# Patient Record
Sex: Female | Born: 1950
Health system: Southern US, Community
[De-identification: ages and names within clinical notes are randomized; demographics above are authoritative.]

## PROBLEM LIST (undated history)

## (undated) DIAGNOSIS — I10 Essential (primary) hypertension: Secondary | ICD-10-CM

## (undated) DIAGNOSIS — M8580 Other specified disorders of bone density and structure, unspecified site: Secondary | ICD-10-CM

## (undated) DIAGNOSIS — M1990 Unspecified osteoarthritis, unspecified site: Secondary | ICD-10-CM

## (undated) DIAGNOSIS — B019 Varicella without complication: Secondary | ICD-10-CM

## (undated) DIAGNOSIS — E785 Hyperlipidemia, unspecified: Secondary | ICD-10-CM

## (undated) DIAGNOSIS — E119 Type 2 diabetes mellitus without complications: Secondary | ICD-10-CM

## (undated) HISTORY — DX: Other specified disorders of bone density and structure, unspecified site: M85.80

## (undated) HISTORY — DX: Unspecified osteoarthritis, unspecified site: M19.90

## (undated) HISTORY — DX: Essential (primary) hypertension: I10

## (undated) HISTORY — DX: Hyperlipidemia, unspecified: E78.5

## (undated) HISTORY — DX: Type 2 diabetes mellitus without complications: E11.9

## (undated) HISTORY — DX: Varicella without complication: B01.9

## (undated) HISTORY — PX: TUBAL LIGATION: SHX77

## (undated) HISTORY — PX: CARDIAC PACEMAKER PLACEMENT: SHX583

---

## 2003-08-26 ENCOUNTER — Encounter: Admission: RE | Admit: 2003-08-26 | Discharge: 2003-08-26 | Payer: Self-pay | Admitting: Family Medicine

## 2003-09-17 ENCOUNTER — Other Ambulatory Visit: Admission: RE | Admit: 2003-09-17 | Discharge: 2003-09-17 | Payer: Self-pay | Admitting: Internal Medicine

## 2005-04-13 ENCOUNTER — Ambulatory Visit: Payer: Self-pay | Admitting: Family Medicine

## 2005-04-13 ENCOUNTER — Other Ambulatory Visit: Admission: RE | Admit: 2005-04-13 | Discharge: 2005-04-13 | Payer: Self-pay | Admitting: Internal Medicine

## 2005-04-23 ENCOUNTER — Ambulatory Visit: Payer: Self-pay | Admitting: Family Medicine

## 2005-04-28 ENCOUNTER — Encounter: Admission: RE | Admit: 2005-04-28 | Discharge: 2005-04-28 | Payer: Self-pay | Admitting: Family Medicine

## 2005-06-07 ENCOUNTER — Ambulatory Visit: Payer: Self-pay | Admitting: Family Medicine

## 2005-06-07 ENCOUNTER — Ambulatory Visit: Payer: Self-pay | Admitting: Gastroenterology

## 2005-06-21 ENCOUNTER — Ambulatory Visit: Payer: Self-pay | Admitting: Gastroenterology

## 2005-08-04 ENCOUNTER — Ambulatory Visit: Payer: Self-pay | Admitting: Family Medicine

## 2005-10-25 ENCOUNTER — Ambulatory Visit (HOSPITAL_BASED_OUTPATIENT_CLINIC_OR_DEPARTMENT_OTHER): Admission: RE | Admit: 2005-10-25 | Discharge: 2005-10-26 | Payer: Self-pay | Admitting: Specialist

## 2007-01-05 HISTORY — PX: REDUCTION MAMMAPLASTY: SUR839

## 2011-01-11 DIAGNOSIS — I10 Essential (primary) hypertension: Secondary | ICD-10-CM | POA: Insufficient documentation

## 2011-04-14 ENCOUNTER — Other Ambulatory Visit: Payer: Self-pay | Admitting: Family Medicine

## 2011-04-14 DIAGNOSIS — Z780 Asymptomatic menopausal state: Secondary | ICD-10-CM

## 2011-04-14 DIAGNOSIS — Z1231 Encounter for screening mammogram for malignant neoplasm of breast: Secondary | ICD-10-CM

## 2011-04-16 DIAGNOSIS — E559 Vitamin D deficiency, unspecified: Secondary | ICD-10-CM | POA: Insufficient documentation

## 2011-05-06 ENCOUNTER — Ambulatory Visit
Admission: RE | Admit: 2011-05-06 | Discharge: 2011-05-06 | Disposition: A | Discharge status: Home / Self Care | Payer: BC Managed Care – PPO | Source: Ambulatory Visit | Attending: Family Medicine | Admitting: Family Medicine

## 2011-05-06 DIAGNOSIS — Z1231 Encounter for screening mammogram for malignant neoplasm of breast: Secondary | ICD-10-CM

## 2011-05-06 DIAGNOSIS — Z780 Asymptomatic menopausal state: Secondary | ICD-10-CM

## 2011-06-01 DIAGNOSIS — M818 Other osteoporosis without current pathological fracture: Secondary | ICD-10-CM | POA: Insufficient documentation

## 2011-11-04 DIAGNOSIS — E1122 Type 2 diabetes mellitus with diabetic chronic kidney disease: Secondary | ICD-10-CM | POA: Insufficient documentation

## 2011-11-04 DIAGNOSIS — E1169 Type 2 diabetes mellitus with other specified complication: Secondary | ICD-10-CM | POA: Insufficient documentation

## 2011-11-04 DIAGNOSIS — N1832 Chronic kidney disease, stage 3b: Secondary | ICD-10-CM | POA: Insufficient documentation

## 2012-10-04 DIAGNOSIS — Z8679 Personal history of other diseases of the circulatory system: Secondary | ICD-10-CM | POA: Insufficient documentation

## 2013-01-30 ENCOUNTER — Other Ambulatory Visit: Payer: Self-pay | Admitting: Family Medicine

## 2013-01-30 DIAGNOSIS — Z1231 Encounter for screening mammogram for malignant neoplasm of breast: Secondary | ICD-10-CM

## 2013-02-12 ENCOUNTER — Ambulatory Visit: Payer: BC Managed Care – PPO

## 2013-02-12 ENCOUNTER — Ambulatory Visit
Admission: RE | Admit: 2013-02-12 | Discharge: 2013-02-12 | Disposition: A | Discharge status: Home / Self Care | Payer: BC Managed Care – PPO | Source: Ambulatory Visit | Attending: Family Medicine | Admitting: Family Medicine

## 2013-02-12 DIAGNOSIS — Z1231 Encounter for screening mammogram for malignant neoplasm of breast: Secondary | ICD-10-CM

## 2014-04-16 ENCOUNTER — Other Ambulatory Visit: Payer: Self-pay | Admitting: Family Medicine

## 2014-04-16 DIAGNOSIS — Z1231 Encounter for screening mammogram for malignant neoplasm of breast: Secondary | ICD-10-CM

## 2014-04-19 ENCOUNTER — Ambulatory Visit
Admission: RE | Admit: 2014-04-19 | Discharge: 2014-04-19 | Disposition: A | Discharge status: Home / Self Care | Payer: BC Managed Care – PPO | Source: Ambulatory Visit | Attending: Family Medicine | Admitting: Family Medicine

## 2014-04-19 DIAGNOSIS — Z1231 Encounter for screening mammogram for malignant neoplasm of breast: Secondary | ICD-10-CM

## 2014-07-16 DIAGNOSIS — M1712 Unilateral primary osteoarthritis, left knee: Secondary | ICD-10-CM | POA: Insufficient documentation

## 2015-07-04 ENCOUNTER — Other Ambulatory Visit: Payer: Self-pay | Admitting: Family Medicine

## 2015-07-04 DIAGNOSIS — Z1231 Encounter for screening mammogram for malignant neoplasm of breast: Secondary | ICD-10-CM

## 2015-07-15 ENCOUNTER — Ambulatory Visit
Admission: RE | Admit: 2015-07-15 | Discharge: 2015-07-15 | Disposition: A | Discharge status: Home / Self Care | Payer: BC Managed Care – PPO | Source: Ambulatory Visit | Attending: Family Medicine | Admitting: Family Medicine

## 2015-07-15 DIAGNOSIS — Z1231 Encounter for screening mammogram for malignant neoplasm of breast: Secondary | ICD-10-CM

## 2015-11-12 DIAGNOSIS — I442 Atrioventricular block, complete: Secondary | ICD-10-CM | POA: Insufficient documentation

## 2015-12-05 DIAGNOSIS — I472 Ventricular tachycardia: Secondary | ICD-10-CM | POA: Insufficient documentation

## 2015-12-05 DIAGNOSIS — I4729 Other ventricular tachycardia: Secondary | ICD-10-CM | POA: Insufficient documentation

## 2016-01-12 DIAGNOSIS — E669 Obesity, unspecified: Secondary | ICD-10-CM | POA: Insufficient documentation

## 2016-06-15 ENCOUNTER — Other Ambulatory Visit: Payer: Self-pay | Admitting: Family Medicine

## 2016-06-15 DIAGNOSIS — Z1231 Encounter for screening mammogram for malignant neoplasm of breast: Secondary | ICD-10-CM

## 2016-07-21 ENCOUNTER — Ambulatory Visit: Payer: BC Managed Care – PPO

## 2016-07-22 ENCOUNTER — Ambulatory Visit
Admission: RE | Admit: 2016-07-22 | Discharge: 2016-07-22 | Disposition: A | Discharge status: Home / Self Care | Payer: Medicare Other | Source: Ambulatory Visit | Attending: Family Medicine | Admitting: Family Medicine

## 2016-07-22 DIAGNOSIS — Z1231 Encounter for screening mammogram for malignant neoplasm of breast: Secondary | ICD-10-CM

## 2016-10-04 ENCOUNTER — Other Ambulatory Visit: Payer: Self-pay | Admitting: Nephrology

## 2016-10-04 DIAGNOSIS — N183 Chronic kidney disease, stage 3 unspecified: Secondary | ICD-10-CM

## 2016-10-05 ENCOUNTER — Ambulatory Visit
Admission: RE | Admit: 2016-10-05 | Discharge: 2016-10-05 | Disposition: A | Discharge status: Home / Self Care | Payer: Medicare Other | Source: Ambulatory Visit | Attending: Nephrology | Admitting: Nephrology

## 2016-10-05 DIAGNOSIS — N183 Chronic kidney disease, stage 3 unspecified: Secondary | ICD-10-CM

## 2017-02-21 DIAGNOSIS — Z1211 Encounter for screening for malignant neoplasm of colon: Secondary | ICD-10-CM | POA: Insufficient documentation

## 2017-02-21 LAB — HM COLONOSCOPY

## 2017-03-10 ENCOUNTER — Other Ambulatory Visit: Payer: Self-pay | Admitting: Nurse Practitioner

## 2017-03-10 DIAGNOSIS — R5381 Other malaise: Secondary | ICD-10-CM

## 2017-03-18 ENCOUNTER — Other Ambulatory Visit: Payer: Self-pay | Admitting: Nurse Practitioner

## 2017-03-18 DIAGNOSIS — E2839 Other primary ovarian failure: Secondary | ICD-10-CM

## 2017-03-24 LAB — HM DIABETES EYE EXAM

## 2017-03-31 ENCOUNTER — Encounter: Payer: Self-pay | Admitting: Emergency Medicine

## 2017-04-21 ENCOUNTER — Ambulatory Visit
Admission: RE | Admit: 2017-04-21 | Discharge: 2017-04-21 | Disposition: A | Discharge status: Home / Self Care | Payer: Medicare Other | Source: Ambulatory Visit | Attending: Nurse Practitioner | Admitting: Nurse Practitioner

## 2017-04-21 DIAGNOSIS — E2839 Other primary ovarian failure: Secondary | ICD-10-CM

## 2017-06-23 ENCOUNTER — Telehealth: Payer: Self-pay | Admitting: Emergency Medicine

## 2017-06-23 NOTE — Telephone Encounter (Signed)
Copied from CRM [id number]. Topic: Inquiry >> Jun 23, 2017  8:42 AM Cipriano BunkerLambe, Annette S wrote: Reason for CRM:    Pt. Is inquiring if office has received records [facility]  Please let pt. Know,  ok to leave message  >> Jun 23, 2017  8:49 AM Lenis Dickinsonillard, Bethany M, CMA wrote: Have you received records?   Left Detailed message informing patient that we have no received her records.   Kathi SimpersAmy Peterman,  LPN

## 2017-06-24 ENCOUNTER — Encounter: Payer: Self-pay | Admitting: Family Medicine

## 2017-06-28 ENCOUNTER — Ambulatory Visit (INDEPENDENT_AMBULATORY_CARE_PROVIDER_SITE_OTHER): Payer: Medicare Other | Admitting: Family Medicine

## 2017-06-28 ENCOUNTER — Encounter: Payer: Self-pay | Admitting: Family Medicine

## 2017-06-28 ENCOUNTER — Other Ambulatory Visit: Payer: Self-pay

## 2017-06-28 VITALS — BP 108/72 | HR 75 | Temp 97.6°F | Resp 16 | Ht 62.25 in | Wt 194.8 lb

## 2017-06-28 DIAGNOSIS — N183 Chronic kidney disease, stage 3 unspecified: Secondary | ICD-10-CM

## 2017-06-28 DIAGNOSIS — E782 Mixed hyperlipidemia: Secondary | ICD-10-CM | POA: Diagnosis not present

## 2017-06-28 DIAGNOSIS — E1122 Type 2 diabetes mellitus with diabetic chronic kidney disease: Secondary | ICD-10-CM | POA: Diagnosis not present

## 2017-06-28 DIAGNOSIS — M818 Other osteoporosis without current pathological fracture: Secondary | ICD-10-CM | POA: Diagnosis not present

## 2017-06-28 DIAGNOSIS — I10 Essential (primary) hypertension: Secondary | ICD-10-CM

## 2017-06-28 DIAGNOSIS — E1169 Type 2 diabetes mellitus with other specified complication: Secondary | ICD-10-CM

## 2017-06-28 LAB — POCT GLYCOSYLATED HEMOGLOBIN (HGB A1C): HEMOGLOBIN A1C: 6.7 % — AB (ref 4.0–5.6)

## 2017-06-28 MED ORDER — METFORMIN HCL ER 750 MG PO TB24: 750.0000 mg | ORAL_TABLET | Freq: Every day | ORAL | 3 refills | Status: DC

## 2017-06-28 MED ORDER — ATORVASTATIN CALCIUM 20 MG PO TABS: 20.0000 mg | ORAL_TABLET | Freq: Every day | ORAL | Status: DC

## 2017-06-28 MED ORDER — ALENDRONATE SODIUM 70 MG PO TABS: 70.0000 mg | ORAL_TABLET | ORAL | 3 refills | Status: DC

## 2017-06-28 NOTE — Patient Instructions (Signed)
It was so good seeing you again! Thank you for establishing with my new practice and allowing me to continue caring for you. It means a lot to me.   Please schedule a follow up appointment with me in 3 months to recheck diabetes and your cholesterol levels on the atrovastatin. Please come fasting.   Medicare recommends an Annual Wellness Visit for all patients. Please schedule this to be done with our Nurse Educator, Maudie Mercury. This is an informative "talk" visit; it's goals are to ensure that your health care needs are being met and to give you education regarding avoiding falls, ensuring you are not suffering from depression or problems with memory or thinking, and to educate you on Advance Care Planning. It helps me take good care of you!  I have changed your metformin medication to once a day.  Please start taking your atrovastatin nightly and your fosamax once a week.  Alendronate tablets What is this medicine? ALENDRONATE (a LEN droe nate) slows calcium loss from bones. It helps to make normal healthy bone and to slow bone loss in people with Paget's disease and osteoporosis. It may be used in others at risk for bone loss. This medicine may be used for other purposes; ask your health care provider or pharmacist if you have questions. COMMON BRAND NAME(S): Fosamax What should I tell my health care provider before I take this medicine? They need to know if you have any of these conditions: -dental disease -esophagus, stomach, or intestine problems, like acid reflux or GERD -kidney disease -low blood calcium -low vitamin D -problems sitting or standing 30 minutes -trouble swallowing -an unusual or allergic reaction to alendronate, other medicines, foods, dyes, or preservatives -pregnant or trying to get pregnant -breast-feeding How should I use this medicine? You must take this medicine exactly as directed or you will lower the amount of the medicine you absorb into your body or you may cause  yourself harm. Take this medicine by mouth first thing in the morning, after you are up for the day. Do not eat or drink anything before you take your medicine. Swallow the tablet with a full glass (6 to 8 fluid ounces) of plain water. Do not take this medicine with any other drink. Do not chew or crush the tablet. After taking this medicine, do not eat breakfast, drink, or take any medicines or vitamins for at least 30 minutes. Sit or stand up for at least 30 minutes after you take this medicine; do not lie down. Do not take your medicine more often than directed. Talk to your pediatrician regarding the use of this medicine in children. Special care may be needed. Overdosage: If you think you have taken too much of this medicine contact a poison control center or emergency room at once. NOTE: This medicine is only for you. Do not share this medicine with others. What if I miss a dose? If you miss a dose, do not take it later in the day. Continue your normal schedule starting the next morning. Do not take double or extra doses. What may interact with this medicine? -aluminum hydroxide -antacids -aspirin -calcium supplements -drugs for inflammation like ibuprofen, naproxen, and others -iron supplements -magnesium supplements -vitamins with minerals This list may not describe all possible interactions. Give your health care provider a list of all the medicines, herbs, non-prescription drugs, or dietary supplements you use. Also tell them if you smoke, drink alcohol, or use illegal drugs. Some items may interact with your medicine.  What should I watch for while using this medicine? Visit your doctor or health care professional for regular checks ups. It may be some time before you see benefit from this medicine. Do not stop taking your medicine except on your doctor's advice. Your doctor or health care professional may order blood tests and other tests to see how you are doing. You should make sure  you get enough calcium and vitamin D while you are taking this medicine, unless your doctor tells you not to. Discuss the foods you eat and the vitamins you take with your health care professional. Some people who take this medicine have severe bone, joint, and/or muscle pain. This medicine may also increase your risk for a broken thigh bone. Tell your doctor right away if you have pain in your upper leg or groin. Tell your doctor if you have any pain that does not go away or that gets worse. This medicine can make you more sensitive to the sun. If you get a rash while taking this medicine, sunlight may cause the rash to get worse. Keep out of the sun. If you cannot avoid being in the sun, wear protective clothing and use sunscreen. Do not use sun lamps or tanning beds/booths. What side effects may I notice from receiving this medicine? Side effects that you should report to your doctor or health care professional as soon as possible: -allergic reactions like skin rash, itching or hives, swelling of the face, lips, or tongue -black or tarry stools -bone, muscle or joint pain -changes in vision -chest pain -heartburn or stomach pain -jaw pain, especially after dental work -pain or trouble when swallowing -redness, blistering, peeling or loosening of the skin, including inside the mouth Side effects that usually do not require medical attention (report to your doctor or health care professional if they continue or are bothersome): -changes in taste -diarrhea or constipation -eye pain or itching -headache -nausea or vomiting -stomach gas or fullness This list may not describe all possible side effects. Call your doctor for medical advice about side effects. You may report side effects to FDA at 1-800-FDA-1088. Where should I keep my medicine? Keep out of the reach of children. Store at room temperature of 15 and 30 degrees C (59 and 86 degrees F). Throw away any unused medicine after the  expiration date. NOTE: This sheet is a summary. It may not cover all possible information. If you have questions about this medicine, talk to your doctor, pharmacist, or health care provider.  2018 Elsevier/Gold Standard (2010-06-19 08:56:09)  Osteoporosis Osteoporosis is the thinning and loss of density in the bones. Osteoporosis makes the bones more brittle, fragile, and likely to break (fracture). Over time, osteoporosis can cause the bones to become so weak that they fracture after a simple fall. The bones most likely to fracture are the bones in the hip, wrist, and spine. What are the causes? The exact cause is not known. What increases the risk? Anyone can develop osteoporosis. You may be at greater risk if you have a family history of the condition or have poor nutrition. You may also have a higher risk if you are:  Female.  66 years old or older.  A smoker.  Not physically active.  White or Asian.  Slender.  What are the signs or symptoms? A fracture might be the first sign of the disease, especially if it results from a fall or injury that would not usually cause a bone to break. Other signs  and symptoms include:  Low back and neck pain.  Stooped posture.  Height loss.  How is this diagnosed? To make a diagnosis, your health care provider may:  Take a medical history.  Perform a physical exam.  Order tests, such as: ? A bone mineral density test. ? A dual-energy X-ray absorptiometry test.  How is this treated? The goal of osteoporosis treatment is to strengthen your bones to reduce your risk of a fracture. Treatment may involve:  Making lifestyle changes, such as: ? Eating a diet rich in calcium. ? Doing weight-bearing and muscle-strengthening exercises. ? Stopping tobacco use. ? Limiting alcohol intake.  Taking medicine to slow the process of bone loss or to increase bone density.  Monitoring your levels of calcium and vitamin D.  Follow these  instructions at home:  Include calcium and vitamin D in your diet. Calcium is important for bone health, and vitamin D helps the body absorb calcium.  Perform weight-bearing and muscle-strengthening exercises as directed by your health care provider.  Do not use any tobacco products, including cigarettes, chewing tobacco, and electronic cigarettes. If you need help quitting, ask your health care provider.  Limit your alcohol intake.  Take medicines only as directed by your health care provider.  Keep all follow-up visits as directed by your health care provider. This is important.  Take precautions at home to lower your risk of falling, such as: ? Keeping rooms well lit and clutter free. ? Installing safety rails on stairs. ? Using rubber mats in the bathroom and other areas that are often wet or slippery. Get help right away if: You fall or injure yourself. This information is not intended to replace advice given to you by your health care provider. Make sure you discuss any questions you have with your health care provider. Document Released: 09/30/2004 Document Revised: 05/26/2015 Document Reviewed: 05/31/2013 Elsevier Interactive Patient Education  [employer].

## 2017-06-28 NOTE — Progress Notes (Signed)
Subjective  CC:  Chief Complaint  Patient presents with  . Establish Care    Former [facility] patient  . Diabetes  . Hyperlipidemia    not taking statin; wasn't on it when last checked in february; no side effects  . Hypertension    on ace w/o cough or SEs.  . Chronic Kidney Disease    was referred to renal; Dr. Rosey Bath in november.     HPI: Amy Bartlett is a 67 y.o. female is a former [facility] patient and is here to reestablish care with me today. I reviewed notes from care everywhere. Last cpe 02/2017. Labs reviewed. Consult notes reviewed.    She has the following concerns or needs:  DM: last a1c November. Only taking metformin 1000 mg daily instead of twice daily.  Eats well.  Regular exercise.  No symptoms of hyper or hypoglycemia.  Denies foot sores or pain at this time.  Eye exam is up-to-date without retinopathy, Dr. Alden Hipp.  Refuses all immunizations. Lab Results  Component Value Date   HGBA1C 6.7 (A) 06/28/2017     Hypertension: Well controlled on ACE inhibitor and HCTZ.  No symptoms of chest pain.  Reviewed cardiology notes.  Normal pacemaker interrogation recently.  No events.  Hyperlipidemia: Not treated.  Not controlled.  Patient stopped atorvastatin last year.  Has not restarted it.  Denies side effects but just does not like taking medications.  Most recent LDL was elevated in February.  HM: refuses all vaccinations. This has been consistent over the years; educated extensively. Had Dexa 04/2017: hasn't f/u for results yet.  Due for annual wellness visit  Osteoporosis: Most recent bone density score showed osteoporosis, -2.6.  Significant decrease since testing done in 2013.  Takes calcium and vitamin D and works out regularly.  Possible family history of osteoporosis in mother.  Chronic kidney disease: She was referred last year to renal by her other PCP.  I reviewed his note.  Stable stage III kidney disease.  No complications  Assessment  1. Benign essential  hypertension   2. CKD (chronic kidney disease) stage 3, GFR 30-59 ml/min (HCC)   3. Combined hyperlipidemia associated with type 2 diabetes mellitus (HCC)   4. Controlled type 2 diabetes mellitus with stage 3 chronic kidney disease, without long-term current use of insulin (HCC)   5. Age-related osteoporosis without fracture      Plan   Hypertension: Well-controlled on current regimen.  Chronic kidney disease: Stable.  Continue prevention and good control of blood pressure and diabetes.  Educated.  Avoid nephrotoxins  Diabetes type 2: Well-controlled in spite of only intermittent dosing of metformin.  Change to once a day XR version.  Educated on diet and management.  Recheck 3 months normal foot exam today.  No diabetic retinopathy.  Refuses immunizations.  Hyperlipidemia: Discussed elevated Cardiovascular risk score.  Recommend restarting atorvastatin 20 mg nightly.  Recheck cholesterol levels in 3 months, fasting  Osteoporosis: Educated on diagnosis and prevention measures.  Recommend starting Fosamax in addition to vitamin D and calcium.  Continue weightbearing exercises.  Discussed appropriate use and expectations.  Recheck bone density in 2 years.  Follow-up with me if any problems taking the medication.  Return for annual wellness visit.  Follow up:  Return in about 3 months (around 09/28/2017) for follow up of diabetes and hypertension, follow up hypercholesterolemia.  Orders Placed This Encounter  Procedures  . POCT glycosylated hemoglobin (Hb A1C)  . HM COLONOSCOPY   Meds ordered this  encounter  Medications  . alendronate (FOSAMAX) 70 MG tablet    Sig: Take 1 tablet (70 mg total) by mouth every 7 (seven) days. Take with a full glass of water on an empty stomach.    Dispense:  4 tablet    Refill:  3  . atorvastatin (LIPITOR) 20 MG tablet    Sig: Take 1 tablet (20 mg total) by mouth at bedtime.  . metFORMIN (GLUCOPHAGE-XR) 750 MG 24 hr tablet    Sig: Take 1 tablet (750 mg  total) by mouth daily with breakfast.    Dispense:  90 tablet    Refill:  3      We updated and reviewed the patient's past history in detail and it is documented below.  Patient Active Problem List   Diagnosis Date Noted  . Encounter for screening colonoscopy 02/21/2017    2/19 colon - no polyps Recommend screening exam in 2/29 - [facility]   . CKD (chronic kidney disease) stage 3, GFR 30-59 ml/min (HCC) 10/04/2016  . Obesity (BMI 30-39.9) 01/12/2016  . NSVT (nonsustained ventricular tachycardia) (HCC) 12/05/2015    Overview:  Noted on PPM.  Echo on 08/2015 EF normal   . Primary localized osteoarthrosis of left lower leg 07/16/2014  . History of complete heart block 10/04/2012    Overview:  Dr. Mayme Genta, S/p pacemaker, stable 2014/cla No ischemia by stress testing 2011   . Combined hyperlipidemia associated with type 2 diabetes mellitus (HCC) 11/04/2011  . Controlled type 2 diabetes mellitus with stage 3 chronic kidney disease, without long-term current use of insulin (HCC) 11/04/2011  . Age-related osteoporosis without fracture 06/01/2011    T = -2.6 04/2017 in L hip. T = -1.2 05/2011; recheck at age 9.   . Vitamin D deficiency 04/16/2011  . Benign essential hypertension 01/11/2011   Health Maintenance  Topic Date Due  . HEMOGLOBIN A1C  October 01, 1950  . Hepatitis C Screening  1950-12-04  . MAMMOGRAM  07/22/2017  . INFLUENZA VACCINE  08/04/2017  . OPHTHALMOLOGY EXAM  03/25/2018  . FOOT EXAM  06/29/2018  . DEXA SCAN  04/22/2019  . TETANUS/TDAP  04/13/2021  . COLONOSCOPY  02/22/2027   Immunization History  Administered Date(s) Administered  . Tdap 04/14/2011  . Zoster 04/14/2011   Current Meds  Medication Sig  . aspirin (ASPIRIN LOW DOSE) 81 MG tablet Take by mouth.  Marland Kitchen atorvastatin (LIPITOR) 20 MG tablet Take 1 tablet (20 mg total) by mouth at bedtime.  . Blood Glucose Monitoring Suppl (FREESTYLE LITE) DEVI by Does not apply route.  . Cholecalciferol (D 5000) 5000  units TABS Take by mouth.  . diclofenac sodium (VOLTAREN) 1 % GEL PLACE 4 G ONTO THE SKIN 4 (FOUR) TIMES A DAY AS NEEDED.  Marland Kitchen lisinopril-hydrochlorothiazide (PRINZIDE,ZESTORETIC) 20-25 MG tablet Take by mouth.  . [DISCONTINUED] atorvastatin (LIPITOR) 20 MG tablet Take 20 mg (1 tab) alternating with 40 mg (2 tabs) at night for cholesterol  . [DISCONTINUED] metFORMIN (GLUCOPHAGE) 1000 MG tablet TAKE ONE TABLET BY MOUTH 2 TIMES DAILY for diabetes.  Next visit June, 2019    Allergies: Patient has No Known Allergies. Past Medical History Patient  has a past medical history of Arthritis, Chicken pox, Diabetes mellitus (HCC), Hyperlipidemia, Hypertension, and Osteopenia. Past Surgical History Patient  has a past surgical history that includes Reduction mammaplasty (Bilateral, 2009); Cardiac pacemaker placement; Tubal ligation; and Cesarean section (1974, 1977, 1979). Family History: Patient family history includes Arthritis in her father; Diabetes in her brother; Glaucoma in her  mother; Healthy in her daughter and daughter; Heart disease in her mother; Hypertension in her mother; Osteoporosis in her mother; Pneumonia in her father; Stroke in her brother. Social History:  Patient  reports that she has never smoked. She has never used smokeless tobacco. She reports that she does not drink alcohol or use drugs.  Review of Systems: Constitutional: negative for fever or malaise Ophthalmic: negative for photophobia, double vision or loss of vision Cardiovascular: negative for chest pain, dyspnea on exertion, or new LE swelling Respiratory: negative for SOB or persistent cough Gastrointestinal: negative for abdominal pain, change in bowel habits or melena Genitourinary: negative for dysuria or gross hematuria Musculoskeletal: negative for new gait disturbance or muscular weakness Integumentary: negative for new or persistent rashes Neurological: negative for TIA or stroke symptoms Psychiatric: negative  for SI or delusions Allergic/Immunologic: negative for hives  Patient Care Team    Relationship Specialty Notifications Start End  Willow OraAndy, Camille L, MD PCP - General Family Medicine  06/28/17   Sallye LatGroat, Christopher, MD Consulting Physician Ophthalmology  06/28/17 06/28/17  Ernesto RutherfordGroat, Robert, MD Consulting Physician Ophthalmology  06/28/17   Samella ParrSatko, Scott Gregory, MD Referring Physician Nephrology  06/28/17   Rulon Serarucker, Michael N, MD Referring Physician Cardiology  06/28/17   Dr. Albin Fellinghou  Dentistry  06/28/17     Objective  Vitals: BP 108/72   Pulse 75   Temp 97.6 F (36.4 C) (Oral)   Resp 16   Ht 5' 2.25" (1.581 m)   Wt 194 lb 12.8 oz (88.4 kg)   SpO2 99%   BMI 35.34 kg/m  General:  Well developed, well nourished, no acute distress  Psych:  Alert and oriented,normal mood and affect HEENT:  Normocephalic, atraumatic, non-icteric sclera, PERRL, oropharynx is without mass or exudate, supple neck without adenopathy, mass or thyromegaly Cardiovascular:  RRR without gallop, rub or murmur, nondisplaced PMI Respiratory:  Good breath sounds bilaterally, CTAB with normal respiratory effort Gastrointestinal: normal bowel sounds, soft, non-tender, no noted masses. No HSM MSK: no deformities, contusions. Joints are without erythema or swelling Skin:  Warm, no rashes or suspicious lesions noted Neurologic:    Mental status is normal. Gross motor and sensory exams are normal. Normal gait  Commons side effects, risks, benefits, and alternatives for medications and treatment plan prescribed today were discussed, and the patient expressed understanding of the given instructions. Patient is instructed to call or message via MyChart if he/she has any questions or concerns regarding our treatment plan. No barriers to understanding were identified. We discussed Red Flag symptoms and signs in detail. Patient expressed understanding regarding what to do in case of urgent or emergency type symptoms.   Medication list was  reconciled, printed and provided to the patient in AVS. Patient instructions and summary information was reviewed with the patient as documented in the AVS. This note was prepared with assistance of Dragon voice recognition software. Occasional wrong-word or sound-a-like substitutions may have occurred due to the inherent limitations of voice recognition software

## 2017-07-04 ENCOUNTER — Ambulatory Visit: Payer: Medicare Other | Admitting: Family Medicine

## 2017-07-28 ENCOUNTER — Other Ambulatory Visit: Payer: Self-pay | Admitting: Nurse Practitioner

## 2017-07-28 DIAGNOSIS — Z1231 Encounter for screening mammogram for malignant neoplasm of breast: Secondary | ICD-10-CM

## 2017-08-02 ENCOUNTER — Other Ambulatory Visit: Payer: Self-pay | Admitting: Nurse Practitioner

## 2017-08-03 ENCOUNTER — Other Ambulatory Visit: Payer: Self-pay | Admitting: Nurse Practitioner

## 2017-08-03 DIAGNOSIS — Z1239 Encounter for other screening for malignant neoplasm of breast: Secondary | ICD-10-CM

## 2017-09-29 ENCOUNTER — Ambulatory Visit (INDEPENDENT_AMBULATORY_CARE_PROVIDER_SITE_OTHER): Payer: Medicare Other | Admitting: Family Medicine

## 2017-09-29 ENCOUNTER — Other Ambulatory Visit: Payer: Self-pay

## 2017-09-29 ENCOUNTER — Encounter: Payer: Self-pay | Admitting: Family Medicine

## 2017-09-29 VITALS — BP 120/82 | HR 81 | Temp 97.8°F | Ht 62.5 in | Wt 198.4 lb

## 2017-09-29 DIAGNOSIS — I10 Essential (primary) hypertension: Secondary | ICD-10-CM

## 2017-09-29 DIAGNOSIS — Z1231 Encounter for screening mammogram for malignant neoplasm of breast: Secondary | ICD-10-CM

## 2017-09-29 DIAGNOSIS — M818 Other osteoporosis without current pathological fracture: Secondary | ICD-10-CM

## 2017-09-29 DIAGNOSIS — E1122 Type 2 diabetes mellitus with diabetic chronic kidney disease: Secondary | ICD-10-CM

## 2017-09-29 DIAGNOSIS — E782 Mixed hyperlipidemia: Secondary | ICD-10-CM

## 2017-09-29 DIAGNOSIS — M778 Other enthesopathies, not elsewhere classified: Secondary | ICD-10-CM

## 2017-09-29 DIAGNOSIS — E1169 Type 2 diabetes mellitus with other specified complication: Secondary | ICD-10-CM | POA: Diagnosis not present

## 2017-09-29 DIAGNOSIS — N183 Chronic kidney disease, stage 3 (moderate): Secondary | ICD-10-CM

## 2017-09-29 DIAGNOSIS — Z1239 Encounter for other screening for malignant neoplasm of breast: Secondary | ICD-10-CM

## 2017-09-29 LAB — MICROALBUMIN / CREATININE URINE RATIO
Creatinine,U: 71 mg/dL
MICROALB/CREAT RATIO: 1 mg/g (ref 0.0–30.0)
Microalb, Ur: <0.7 mg/dL (ref 0.0–1.9)

## 2017-09-29 LAB — COMPREHENSIVE METABOLIC PANEL
ALK PHOS: 78 U/L (ref 39–117)
ALT: 16 U/L (ref 0–35)
AST: 14 U/L (ref 0–37)
Albumin: 4.1 g/dL (ref 3.5–5.2)
BILIRUBIN TOTAL: 0.4 mg/dL (ref 0.2–1.2)
BUN: 24 mg/dL — ABNORMAL HIGH (ref 6–23)
CO2: 30 mEq/L (ref 19–32)
CREATININE: 1.22 mg/dL — AB (ref 0.40–1.20)
Calcium: 9.3 mg/dL (ref 8.4–10.5)
Chloride: 99 mEq/L (ref 96–112)
GFR: 56.6 mL/min — AB (ref >60.00)
Glucose, Bld: 163 mg/dL — ABNORMAL HIGH (ref 70–99)
Potassium: 4.6 mEq/L (ref 3.5–5.1)
Sodium: 136 mEq/L (ref 135–145)
TOTAL PROTEIN: 7.3 g/dL (ref 6.0–8.3)

## 2017-09-29 LAB — LIPID PANEL
Cholesterol: 158 mg/dL (ref 0–200)
HDL: 54.8 mg/dL (ref >39.00)
LDL Cholesterol: 75 mg/dL (ref 0–99)
NONHDL: 103.28
TRIGLYCERIDES: 139 mg/dL (ref 0.0–149.0)
Total CHOL/HDL Ratio: 3
VLDL: 27.8 mg/dL (ref 0.0–40.0)

## 2017-09-29 LAB — POCT GLYCOSYLATED HEMOGLOBIN (HGB A1C): Hemoglobin A1C: 7.5 % — AB (ref 4.0–5.6)

## 2017-09-29 MED ORDER — METFORMIN HCL ER 750 MG PO TB24: 1500.0000 mg | ORAL_TABLET | Freq: Every day | ORAL | 3 refills | Status: DC

## 2017-09-29 NOTE — Patient Instructions (Addendum)
Please return in 3 months for diabetes follow up We are increasing your dose of metformin today. Take 2 tabs daily. Get back on your diabetes diet. We need to improve your control again.   Medicare recommends an Annual Wellness Visit for all patients. Please schedule this to be done with our Nurse Educator, Maudie Mercury. This is an informative "talk" visit; it's goals are to ensure that your health care needs are being met and to give you education regarding avoiding falls, ensuring you are not suffering from depression or problems with memory or thinking, and to educate you on Advance Care Planning. It helps me take good care of you!  I will release your lab results to you on your MyChart account with further instructions. Please reply with any questions.   We will call you with information regarding your referral appointment. Mammogram.  If you do not hear from [place] within the next 2 weeks, please let me know. It can take 1-2 weeks to get appointments set up with the specialists.   If you have any questions or concerns, please don't hesitate to send me a message via MyChart or call the office at [phone]. Thank you for visiting with [place] today! It's our pleasure caring for you.   Diabetes Mellitus and Nutrition When you have diabetes (diabetes mellitus), it is very important to have healthy eating habits because your blood sugar (glucose) levels are greatly affected by what you eat and drink. Eating healthy foods in the appropriate amounts, at about the same times every day, can help you:  Control your blood glucose.  Lower your risk of heart disease.  Improve your blood pressure.  Reach or maintain a healthy weight.  Every person with diabetes is different, and each person has different needs for a meal plan. Your health care provider may recommend that you work with a diet and nutrition specialist (dietitian) to make a meal plan that is best for you. Your meal plan may vary depending on factors  such as:  The calories you need.  The medicines you take.  Your weight.  Your blood glucose, blood pressure, and cholesterol levels.  Your activity level.  Other health conditions you have, such as heart or kidney disease.  How do carbohydrates affect me? Carbohydrates affect your blood glucose level more than any other type of food. Eating carbohydrates naturally increases the amount of glucose in your blood. Carbohydrate counting is a method for keeping track of how many carbohydrates you eat. Counting carbohydrates is important to keep your blood glucose at a healthy level, especially if you use insulin or take certain oral diabetes medicines. It is important to know how many carbohydrates you can safely have in each meal. This is different for every person. Your dietitian can help you calculate how many carbohydrates you should have at each meal and for snack. Foods that contain carbohydrates include:  Bread, cereal, rice, pasta, and crackers.  Potatoes and corn.  Peas, beans, and lentils.  Milk and yogurt.  Fruit and juice.  Desserts, such as cakes, cookies, ice cream, and candy.  How does alcohol affect me? Alcohol can cause a sudden decrease in blood glucose (hypoglycemia), especially if you use insulin or take certain oral diabetes medicines. Hypoglycemia can be a life-threatening condition. Symptoms of hypoglycemia (sleepiness, dizziness, and confusion) are similar to symptoms of having too much alcohol. If your health care provider says that alcohol is safe for you, follow these guidelines:  Limit alcohol intake to no more  than 1 drink per day for nonpregnant women and 2 drinks per day for men. One drink equals 12 oz of beer, 5 oz of wine, or 1 oz of hard liquor.  Do not drink on an empty stomach.  Keep yourself hydrated with water, diet soda, or unsweetened iced tea.  Keep in mind that regular soda, juice, and other mixers may contain a lot of sugar and must be  counted as carbohydrates.  What are tips for following this plan? Reading food labels  Start by checking the serving size on the label. The amount of calories, carbohydrates, fats, and other nutrients listed on the label are based on one serving of the food. Many foods contain more than one serving per package.  Check the total grams (g) of carbohydrates in one serving. You can calculate the number of servings of carbohydrates in one serving by dividing the total carbohydrates by 15. For example, if a food has 30 g of total carbohydrates, it would be equal to 2 servings of carbohydrates.  Check the number of grams (g) of saturated and trans fats in one serving. Choose foods that have low or no amount of these fats.  Check the number of milligrams (mg) of sodium in one serving. Most people should limit total sodium intake to less than 2,300 mg per day.  Always check the nutrition information of foods labeled as "low-fat" or "nonfat". These foods may be higher in added sugar or refined carbohydrates and should be avoided.  Talk to your dietitian to identify your daily goals for nutrients listed on the label. Shopping  Avoid buying canned, premade, or processed foods. These foods tend to be high in fat, sodium, and added sugar.  Shop around the outside edge of the grocery store. This includes fresh fruits and vegetables, bulk grains, fresh meats, and fresh dairy. Cooking  Use low-heat cooking methods, such as baking, instead of high-heat cooking methods like deep frying.  Cook using healthy oils, such as olive, canola, or sunflower oil.  Avoid cooking with butter, cream, or high-fat meats. Meal planning  Eat meals and snacks regularly, preferably at the same times every day. Avoid going long periods of time without eating.  Eat foods high in fiber, such as fresh fruits, vegetables, beans, and whole grains. Talk to your dietitian about how many servings of carbohydrates you can eat at  each meal.  Eat 4-6 ounces of lean protein each day, such as lean meat, chicken, fish, eggs, or tofu. 1 ounce is equal to 1 ounce of meat, chicken, or fish, 1 egg, or 1/4 cup of tofu.  Eat some foods each day that contain healthy fats, such as avocado, nuts, seeds, and fish. Lifestyle   Check your blood glucose regularly.  Exercise at least 30 minutes 5 or more days each week, or as told by your health care provider.  Take medicines as told by your health care provider.  Do not use any products that contain nicotine or tobacco, such as cigarettes and e-cigarettes. If you need help quitting, ask your health care provider.  Work with a Social worker or diabetes educator to identify strategies to manage stress and any emotional and social challenges. What are some questions to ask my health care provider?  Do I need to meet with a diabetes educator?  Do I need to meet with a dietitian?  What number can I call if I have questions?  When are the best times to check my blood glucose? Where to  find more information:  American Diabetes Association: diabetes.org/food-and-fitness/food  Academy of Nutrition and Dietetics: PokerClues.dk  [employer] of Diabetes and Digestive and Kidney Diseases (NIH): ContactWire.be Summary  A healthy meal plan will help you control your blood glucose and maintain a healthy lifestyle.  Working with a diet and nutrition specialist (dietitian) can help you make a meal plan that is best for you.  Keep in mind that carbohydrates and alcohol have immediate effects on your blood glucose levels. It is important to count carbohydrates and to use alcohol carefully. This information is not intended to replace advice given to you by your health care provider. Make sure you discuss any questions you have with your health care  provider. Document Released: 09/17/2004 Document Revised: 01/26/2016 Document Reviewed: 01/26/2016 Elsevier Interactive Patient Education  [employer].

## 2017-09-29 NOTE — Progress Notes (Signed)
Please call patient: I have reviewed his/her lab results. All lab test results look stable. Cholesterol is now well controlled on meds so keep taking one tablet nightly (lipitor 20). Sugar is elevated as discussed so increase the metformin and work on diet as discussed. No other changes needed at this time.

## 2017-09-29 NOTE — Progress Notes (Signed)
Subjective  CC:  Chief Complaint  Patient presents with  . Diabetes    last a1c 06/28/2017  . Hyperlipidemia    Patient is fasting today   . Wrist Pain    swollen and cannot put alot of pressure on her Right Wrist     HPI: Amy Bartlett is a 67 y.o. female who presents to the office today for follow up of diabetes, hypertension and problems listed above in the chief complaint.   Diabetic f/u: Her diabetic control is reported as Worse.  Patient reports she has not been eating as well as she had been.  Got off track after vacations.  She denies symptoms of hyperglycemia.  We decreased her metformin dose to 750 mg daily due to the XR version.  She takes that without adverse effects.   She denies exertional CP or SOB or symptomatic hypoglycemia. She denies foot sores.    Hypertension f/u: Control is good . Pt reports she is doing well. taking medications as instructed, no medication side effects noted, no TIAs, no chest pain on exertion, no dyspnea on exertion, no swelling of ankles.  She denies adverse effects from his BP medications. Compliance with medication is good.    Hyperlipidemia f/u: Patient presents for follow up of lipids.  We started statin 3 months ago.  Compliance with treatment thus far has been good. The patient does not use medications that may worsen dyslipidemias (corticosteroids, progestins, anabolic steroids, diuretics, beta-blockers, amiodarone, cyclosporine, olanzapine). The patient exercises frequently. The patient is not known to have coexisting coronary artery disease.  She denies adverse effects.  No myalgias  Osteoporosis follow-up: Start taking Fosamax about 8 weeks ago.  She did miss.  She denies adverse effects  Weights last weekend.  Had pain following day with.  No redness or warmth.  Swelling has resolved   Assessment  1. Controlled type 2 diabetes mellitus with stage 3 chronic kidney disease, without long-term current use of insulin (HCC)   2.  Combined hyperlipidemia associated with type 2 diabetes mellitus (HCC)   3. Benign essential hypertension   4. Age-related osteoporosis without fracture   5. Breast cancer screening   6. Right wrist tendinitis      Plan   Diabetes is currently marginally controlled.  Discussed worsening control due to diet and medication changes.  Double metformin XR to 1500 mg daily.  Work hard in diet.  Recheck 3 months.  Patient declines flu.  Her urine microalbuminuria today  Hypertension is currently well controlled.  No medication changes at this time  Hyperlipidemia f/u: Recheck fasting lipids today on statin.  Check LFTs  Tendinitis, overuse: Rice therapy and over-the-counter NSAIDs.  Rest for 2 weeks.  May use over-the-counter wrist splint as needed.  Osteoporosis follow-up: Encouraged to take weekly as directed.  Tolerating well. Diabetic education: ongoing education regarding chronic disease management for diabetes was given today. We continue to reinforce the ABC's of diabetic management: A1c (<7 or 8 dependent upon patient), tight blood pressure control, and cholesterol management with goal LDL < 100 minimally. We discuss diet strategies, exercise recommendations, medication options and possible side effects. At each visit, we review recommended immunizations and preventive care recommendations for diabetics and stress that good diabetic control can prevent other problems. See below for this patient's data. Hypertension education: ongoing education regarding management of these chronic disease states was given. Management strategies discussed on successive visits include dietary and exercise recommendations, goals of achieving and maintaining IBW, and lifestyle  modifications aiming for adequate sleep and minimizing stressors.   Follow up: Return in about 3 months (around 12/29/2017)..  Orders Placed This Encounter  Procedures  . MM DIGITAL SCREENING BILATERAL  . Lipid panel  . Comprehensive  metabolic panel  . Microalbumin / creatinine urine ratio  . POCT glycosylated hemoglobin (Hb A1C)   Meds ordered this encounter  Medications  . metFORMIN (GLUCOPHAGE-XR) 750 MG 24 hr tablet    Sig: Take 2 tablets (1,500 mg total) by mouth daily with breakfast.    Dispense:  90 tablet    Refill:  3      I reviewed the patients updated PMH, FH, and SocHx.  Patient Active Problem List   Diagnosis Date Noted  . Encounter for screening colonoscopy 02/21/2017  . CKD (chronic kidney disease) stage 3, GFR 30-59 ml/min (HCC) 10/04/2016  . Obesity (BMI 30-39.9) 01/12/2016  . Primary localized osteoarthrosis of left lower leg 07/16/2014  . History of complete heart block 10/04/2012  . Combined hyperlipidemia associated with type 2 diabetes mellitus (HCC) 11/04/2011  . Controlled type 2 diabetes mellitus with stage 3 chronic kidney disease, without long-term current use of insulin (HCC) 11/04/2011  . Age-related osteoporosis without fracture 06/01/2011  . Vitamin D deficiency 04/16/2011  . Benign essential hypertension 01/11/2011   Immunization History  Administered Date(s) Administered  . Tdap 04/14/2011  . Zoster 04/14/2011   Health Maintenance  Topic Date Due  . MAMMOGRAM  07/22/2017  . HEMOGLOBIN A1C  12/28/2017  . OPHTHALMOLOGY EXAM  03/25/2018  . FOOT EXAM  06/29/2018  . DEXA SCAN  04/22/2019  . TETANUS/TDAP  04/13/2021  . COLONOSCOPY  02/22/2027  . INFLUENZA VACCINE  Discontinued  . Hepatitis C Screening  Discontinued   Diabetes and HTN Related Lab Review: Lab Results  Component Value Date   HGBA1C 7.5 (A) 09/29/2017   HGBA1C 6.7 (A) 06/28/2017    No results found for: MICROALBUR, MALB24HUR No results found for: CREATININE, BUN, NA, K, CL, CO2 No results found for: CHOL No results found for: HDL No results found for: LDLCALC No results found for: TRIG No results found for: CHOLHDL No results found for: LDLDIRECT The ASCVD Risk score Denman George DC Jr., et al., 2013)  failed to calculate for the following reasons:   Cannot find a previous HDL lab  BP Readings from Last 3 Encounters:  09/29/17 120/82  06/28/17 108/72   Wt Readings from Last 3 Encounters:  09/29/17 198 lb 6.4 oz (90 kg)  06/28/17 194 lb 12.8 oz (88.4 kg)    Allergies: Patient has No Known Allergies. Family History: Patient family history includes Arthritis in her father; Diabetes in her brother; Glaucoma in her mother; Healthy in her daughter and daughter; Heart disease in her mother; Hypertension in her mother; Osteoporosis in her mother; Pneumonia in her father; Stroke in her brother. Social History:  Patient  reports that she has never smoked. She has never used smokeless tobacco. She reports that she does not drink alcohol or use drugs.  Review of Systems: Ophthalmic: negative for eye pain, loss of vision or double vision Cardiovascular: negative for chest pain Respiratory: negative for SOB or persistent cough Gastrointestinal: negative for abdominal pain Genitourinary: negative for dysuria or gross hematuria MSK: negative for foot lesions Neurologic: negative for weakness or gait disturbance Current Meds  Medication Sig  . alendronate (FOSAMAX) 70 MG tablet Take 1 tablet (70 mg total) by mouth every 7 (seven) days. Take with a full glass of water on  an empty stomach.  Marland Kitchen aspirin (ASPIRIN LOW DOSE) 81 MG tablet Take by mouth.  Marland Kitchen atorvastatin (LIPITOR) 20 MG tablet Take 1 tablet (20 mg total) by mouth at bedtime.  . Blood Glucose Monitoring Suppl (FREESTYLE LITE) DEVI by Does not apply route.  . Cholecalciferol (D 5000) 5000 units TABS Take by mouth.  . diclofenac sodium (VOLTAREN) 1 % GEL PLACE 4 G ONTO THE SKIN 4 (FOUR) TIMES A DAY AS NEEDED.  Marland Kitchen glucose blood (ONE TOUCH ULTRA TEST) test strip CHECK BLOOD SUGAR DAILY.  Marland Kitchen lisinopril-hydrochlorothiazide (PRINZIDE,ZESTORETIC) 20-25 MG tablet Take by mouth.  . metFORMIN (GLUCOPHAGE-XR) 750 MG 24 hr tablet Take 2 tablets (1,500 mg  total) by mouth daily with breakfast.  . [DISCONTINUED] metFORMIN (GLUCOPHAGE-XR) 750 MG 24 hr tablet Take 1 tablet (750 mg total) by mouth daily with breakfast.    Objective  Vitals: BP 120/82   Pulse 81   Temp 97.8 F (36.6 C)   Ht 5' 2.5" (1.588 m)   Wt 198 lb 6.4 oz (90 kg)   SpO2 98%   BMI 35.71 kg/m  General: well appearing, no acute distress  Psych:  Alert and oriented, normal mood and affect HEENT:  Normocephalic, atraumatic, moist mucous membranes, supple neck  Cardiovascular:  Nl S1 and S2, RRR without murmur, gallop or rub. no edema Right wrist: Normal range of motion, no bony tenderness, mild pain with resisted dorsiflexion.  Normal grip strength  neurologic:   Mental status is normal. normal gait Foot exam: no erythema, pallor, or cyanosis visible nl proprioception and sensation to monofilament testing bilaterally, +2 distal pulses bilaterally   Commons side effects, risks, benefits, and alternatives for medications and treatment plan prescribed today were discussed, and the patient expressed understanding of the given instructions. Patient is instructed to call or message via MyChart if he/she has any questions or concerns regarding our treatment plan. No barriers to understanding were identified. We discussed Red Flag symptoms and signs in detail. Patient expressed understanding regarding what to do in case of urgent or emergency type symptoms.   Medication list was reconciled, printed and provided to the patient in AVS. Patient instructions and summary information was reviewed with the patient as documented in the AVS. This note was prepared with assistance of Dragon voice recognition software. Occasional wrong-word or sound-a-like substitutions may have occurred due to the inherent limitations of voice recognition software

## 2017-12-22 ENCOUNTER — Other Ambulatory Visit: Payer: Self-pay | Admitting: Family Medicine

## 2017-12-22 ENCOUNTER — Encounter: Payer: Self-pay | Admitting: Family Medicine

## 2017-12-22 ENCOUNTER — Other Ambulatory Visit: Payer: Self-pay

## 2017-12-22 ENCOUNTER — Ambulatory Visit (INDEPENDENT_AMBULATORY_CARE_PROVIDER_SITE_OTHER): Payer: Medicare Other | Admitting: Family Medicine

## 2017-12-22 VITALS — BP 126/88 | HR 75 | Temp 98.1°F | Resp 16 | Ht 63.0 in | Wt 198.0 lb

## 2017-12-22 DIAGNOSIS — N183 Chronic kidney disease, stage 3 (moderate): Secondary | ICD-10-CM

## 2017-12-22 DIAGNOSIS — M818 Other osteoporosis without current pathological fracture: Secondary | ICD-10-CM | POA: Diagnosis not present

## 2017-12-22 DIAGNOSIS — Z1231 Encounter for screening mammogram for malignant neoplasm of breast: Secondary | ICD-10-CM

## 2017-12-22 DIAGNOSIS — E1122 Type 2 diabetes mellitus with diabetic chronic kidney disease: Secondary | ICD-10-CM | POA: Diagnosis not present

## 2017-12-22 DIAGNOSIS — I10 Essential (primary) hypertension: Secondary | ICD-10-CM

## 2017-12-22 LAB — POCT GLYCOSYLATED HEMOGLOBIN (HGB A1C): Hemoglobin A1C: 7.3 % — AB (ref 4.0–5.6)

## 2017-12-22 MED ORDER — ALENDRONATE SODIUM 70 MG PO TABS: 70.0000 mg | ORAL_TABLET | ORAL | 3 refills | Status: DC

## 2017-12-22 MED ORDER — ATORVASTATIN CALCIUM 20 MG PO TABS: 20.0000 mg | ORAL_TABLET | Freq: Every day | ORAL | Status: DC

## 2017-12-22 MED ORDER — GLUCOSE BLOOD VI STRP: ORAL_STRIP | 11 refills | Status: DC

## 2017-12-22 MED ORDER — LISINOPRIL-HYDROCHLOROTHIAZIDE 20-25 MG PO TABS: 1.0000 | ORAL_TABLET | Freq: Every day | ORAL | 3 refills | Status: DC

## 2017-12-22 NOTE — Patient Instructions (Addendum)
Please return in3 months for diabetes follow up  Your diabetes will be better if you get your medicines in consistently.   Take the lisinopril and atrovastatin together at night. Take TWO metformin together every morning.  Take the fosamax every Sunday morning as directed.   Please set up your mammogram.   If you have any questions or concerns, please don't hesitate to send me a message via MyChart or call the office at [phone]. Thank you for visiting with Amy Bartlett today! It's our pleasure caring for you.   Type 2 Diabetes Mellitus, Self Care, Adult When you have type 2 diabetes (type 2 diabetes mellitus), you must make sure your blood sugar (glucose) stays in a healthy range. You can do this with:  Nutrition.  Exercise.  Lifestyle changes.  Medicines or insulin, if needed.  Support from your doctors and others. How to stay aware of blood sugar   Check your blood sugar level every day, as often as told.  Have your A1c (hemoglobin A1c) level checked two or more times a year. Have it checked more often if your doctor tells you to. Your doctor will set personal treatment goals for you. Generally, you should have these blood sugar levels:  Before meals (preprandial): 80-130 mg/dL (4.4-7.2 mmol/L).  After meals (postprandial): below 180 mg/dL (10 mmol/L).  A1c level: less than 7%. How to manage high and low blood sugar Signs of high blood sugar High blood sugar is called hyperglycemia. Know the signs of high blood sugar. Signs may include:  Feeling: ? Thirsty. ? Hungry. ? Very tired.  Needing to pee (urinate) more than usual.  Blurry vision. Signs of low blood sugar Low blood sugar is called hypoglycemia. This is when blood sugar is at or below 70 mg/dL (3.9 mmol/L). Signs may include:  Feeling: ? Hungry. ? Worried or nervous (anxious). ? Sweaty and clammy. ? Confused. ? Dizzy. ? Sleepy. ? Sick to your stomach (nauseous).  Having: ? A fast heartbeat. ? A  headache. ? A change in your vision. ? Jerky movements that you cannot control (seizure). ? Tingling or no feeling (numbness) around your mouth, lips, or tongue.  Having trouble with: ? Moving (coordination). ? Sleeping. ? Passing out (fainting). ? Getting upset easily (irritability). Treating low blood sugar To treat low blood sugar, eat or drink something sugary right away. If you can think clearly and swallow safely, follow the 15:15 rule:  Take 15 grams of a fast-acting carb (carbohydrate). Talk with your doctor about how much you should take.  Some fast-acting carbs are: ? Sugar tablets (glucose pills). Take 3-4 pills. ? 6-8 pieces of hard candy. ? 4-6 oz (120-150 mL) of fruit juice. ? 4-6 oz (120-150 mL) of regular (not diet) soda. ? 1 Tbsp (15 mL) honey or sugar.  Check your blood sugar 15 minutes after you take the carb.  If your blood sugar is still at or below 70 mg/dL (3.9 mmol/L), take 15 grams of a carb again.  If your blood sugar does not go above 70 mg/dL (3.9 mmol/L) after 3 tries, get help right away.  After your blood sugar goes back to normal, eat a meal or a snack within 1 hour. Treating very low blood sugar If your blood sugar is at or below 54 mg/dL (3 mmol/L), you have very low blood sugar (severe hypoglycemia). This is an emergency. Do not wait to see if the symptoms will go away. Get medical help right away. Call your local emergency  services (911 in the [place] If you have very low blood sugar and you cannot eat or drink, you may need a glucagon shot (injection). A family member or friend should learn how to check your blood sugar and how to give you a glucagon shot. Ask your doctor if you need to have a glucagon shot kit at home. Follow these instructions at home: Medicine  Take insulin and diabetes medicines as told.  If your doctor says you should take more or less insulin and medicines, do this exactly as told.  Do not run out of insulin or  medicines. Having diabetes can raise your risk for other long-term conditions. These include heart disease and kidney disease. Your doctor may prescribe medicines to help you not have these problems. Food   Make healthy food choices. These include: ? Chicken, fish, egg whites, and beans. ? Oats, whole wheat, bulgur, Ramo rice, quinoa, and millet. ? Fresh fruits and vegetables. ? Low-fat dairy products. ? Nuts, avocado, olive oil, and canola oil.  Meet with a food specialist (dietitian). He or she can help you make an eating plan that is right for you.  Follow instructions from your doctor about what you cannot eat or drink.  Drink enough fluid to keep your pee (urine) pale yellow.  Keep track of carbs that you eat. Do this by reading food labels and learning food serving sizes.  Follow your sick day plan when you cannot eat or drink normally. Make this plan with your doctor so it is ready to use. Activity  Exercise 3 or more times a week.  Do not go more than 2 days without exercising.  Talk with your doctor before you start a new exercise. Your doctor may need to tell you to change: ? How much insulin or medicines you take. ? How much food you eat. Lifestyle  Do not use any tobacco products. These include cigarettes, chewing tobacco, and e-cigarettes. If you need help quitting, ask your doctor.  Ask your doctor how much alcohol is safe for you.  Learn to deal with stress. If you need help with this, ask your doctor. Body care   Stay up to date with your shots (immunizations).  Have your eyes and feet checked by a doctor as often as told.  Check your skin and feet every day. Check for cuts, bruises, redness, blisters, or sores.  Brush your teeth and gums two times a day. Floss one or more times a day.  Go to the dentist one or more times every 6 months.  Stay at a healthy weight. General instructions  Take over-the-counter and prescription medicines only as told  by your doctor.  Share your diabetes care plan with: ? Your work or school. ? People you live with.  Carry a card or wear jewelry that says you have diabetes.  Keep all follow-up visits as told by your doctor. This is important. Questions to ask your doctor  Do I need to meet with a diabetes educator?  Where can I find a support group for people with diabetes? Where to find more information To learn more about diabetes, visit:  American Diabetes Association: www.diabetes.org  American Association of Diabetes Educators: www.diabeteseducator.org Summary  When you have type 2 diabetes, you must make sure your blood sugar (glucose) stays in a healthy range.  Check your blood sugar every day, as often as told.  Having diabetes can raise your risk for other conditions. Your doctor may prescribe medicines to help  you not have these problems.  Keep all follow-up visits as told by your doctor. This is important. This information is not intended to replace advice given to you by your health care provider. Make sure you discuss any questions you have with your health care provider. Document Released: 04/14/2015 Document Revised: 06/13/2017 Document Reviewed: 01/24/2015 Elsevier Interactive Patient Education  2019 [employer].

## 2017-12-22 NOTE — Progress Notes (Signed)
Subjective  CC:  Chief Complaint  Patient presents with  . Diabetes    Need refill on all medications    HPI: Amy Bartlett is a 67 y.o. female who presents to the office today for follow up of diabetes and problems listed above in the chief complaint.   Diabetes follow up: Her diabetic control is reported as mininmally improved. . Her routine was off: had to be out of town for a month helping her daughter who was sick. Hasn't been eating right. As well, only taking metformin 750 daily most days: was supposed to be on 1500mg  daily.  She denies exertional CP or SOB or symptomatic hypoglycemia. She denies foot sores or paresthesias.   HTN on lisinopril but reports would get upset stomach if takes lisinopril with her metformin. bp has been controlled. No AEs  Noncompliance with fosamax as well. Is willing to take it, just hasn't.   Needs lipitor refilled. ldl at goal. Elevated trigs.   Assessment  1. Controlled type 2 diabetes mellitus with stage 3 chronic kidney disease, without long-term current use of insulin (HCC)   2. Benign essential hypertension   3. Age-related osteoporosis without fracture      Plan   Diabetes is currently marginally controlled. Improved but needs to increase meds as directed. Problem solved with pt on how to get 2 tabs metformin in every morning. Pt to restart diabetic diet. Eye exam due in march. Refuses imms.  Needs SGLT2i next visit if not at goal. Pt understands and agrees.   htn is fairly well controlled. Take lisinopril at night.   Refilled fosamax to start weekly.   Follow up: Return in about 3 months (around 03/23/2018).. Orders Placed This Encounter  Procedures  . POCT glycosylated hemoglobin (Hb A1C)   Meds ordered this encounter  Medications  . atorvastatin (LIPITOR) 20 MG tablet    Sig: Take 1 tablet (20 mg total) by mouth at bedtime.  Marland Kitchen. glucose blood (ONE TOUCH ULTRA TEST) test strip    Sig: CHECK BLOOD SUGAR DAILY.    Dispense:   100 each    Refill:  11  . lisinopril-hydrochlorothiazide (PRINZIDE,ZESTORETIC) 20-25 MG tablet    Sig: Take 1 tablet by mouth daily.    Dispense:  90 tablet    Refill:  3  . alendronate (FOSAMAX) 70 MG tablet    Sig: Take 1 tablet (70 mg total) by mouth every 7 (seven) days. Take with a full glass of water on an empty stomach.    Dispense:  4 tablet    Refill:  3      Immunization History  Administered Date(s) Administered  . Tdap 04/14/2011  . Zoster 04/14/2011    Diabetes Related Lab Review: Lab Results  Component Value Date   HGBA1C 7.3 (A) 12/22/2017   HGBA1C 7.5 (A) 09/29/2017   HGBA1C 6.7 (A) 06/28/2017    Lab Results  Component Value Date   MICROALBUR <0.7 09/29/2017   Lab Results  Component Value Date   CREATININE 1.22 (H) 09/29/2017   BUN 24 (H) 09/29/2017   NA 136 09/29/2017   K 4.6 09/29/2017   CL 99 09/29/2017   CO2 30 09/29/2017   Lab Results  Component Value Date   CHOL 158 09/29/2017   Lab Results  Component Value Date   HDL 54.80 09/29/2017   Lab Results  Component Value Date   LDLCALC 75 09/29/2017   Lab Results  Component Value Date   TRIG 139.0 09/29/2017  Lab Results  Component Value Date   CHOLHDL 3 09/29/2017   No results found for: LDLDIRECT The 10-year ASCVD risk score Denman George(Goff DC Jr., et al., 2013) is: 17.2%   Values used to calculate the score:     Age: 89 years     Sex: Female     Is Non-Hispanic African American: Yes     Diabetic: Yes     Tobacco smoker: No     Systolic Blood Pressure: 126 mmHg     Is BP treated: Yes     HDL Cholesterol: 54.8 mg/dL     Total Cholesterol: 158 mg/dL I have reviewed the PMH, Fam and Soc history. Patient Active Problem List   Diagnosis Date Noted  . Encounter for screening colonoscopy 02/21/2017    2/19 colon - no polyps Recommend screening exam in 2/29 - [facility]   . CKD (chronic kidney disease) stage 3, GFR 30-59 ml/min (HCC) 10/04/2016  . Obesity (BMI 30-39.9) 01/12/2016  .  Primary localized osteoarthrosis of left lower leg 07/16/2014  . History of complete heart block 10/04/2012    Overview:  Dr. Mayme Gentarucker, S/p pacemaker, stable 2014/cla No ischemia by stress testing 2011   . Combined hyperlipidemia associated with type 2 diabetes mellitus (HCC) 11/04/2011  . Controlled type 2 diabetes mellitus with stage 3 chronic kidney disease, without long-term current use of insulin (HCC) 11/04/2011  . Age-related osteoporosis without fracture 06/01/2011    T = -2.6 04/2017 in L hip. T = -1.2 05/2011; recheck at age 67.   . Vitamin D deficiency 04/16/2011  . Benign essential hypertension 01/11/2011    Social History: Patient  reports that she has never smoked. She has never used smokeless tobacco. She reports that she does not drink alcohol or use drugs.  Review of Systems: Ophthalmic: negative for eye pain, loss of vision or double vision Cardiovascular: negative for chest pain Respiratory: negative for SOB or persistent cough Gastrointestinal: negative for abdominal pain Genitourinary: negative for dysuria or gross hematuria MSK: negative for foot lesions Neurologic: negative for weakness or gait disturbance  Objective  Vitals: BP 126/88   Pulse 75   Temp 98.1 F (36.7 C) (Oral)   Resp 16   Ht 5\' 3"  (1.6 m)   Wt 198 lb (89.8 kg)   SpO2 99%   BMI 35.07 kg/m  General: well appearing, no acute distress  Psych:  Alert and oriented, normal mood and affect HEENT:  Normocephalic, atraumatic, moist mucous membranes, supple neck  Cardiovascular:  Nl S1 and S2, RRR without murmur, gallop or rub. no edema Respiratory:  Good breath sounds bilaterally, CTAB with normal effort, no rales Gastrointestinal: normal BS, soft, nontender Skin:  Warm, no rashes Neurologic:   Mental status is normal. normal gait     Diabetic education: ongoing education regarding chronic disease management for diabetes was given today. We continue to reinforce the ABC's of diabetic  management: A1c (<7 or 8 dependent upon patient), tight blood pressure control, and cholesterol management with goal LDL < 100 minimally. We discuss diet strategies, exercise recommendations, medication options and possible side effects. At each visit, we review recommended immunizations and preventive care recommendations for diabetics and stress that good diabetic control can prevent other problems. See below for this patient's data.    Commons side effects, risks, benefits, and alternatives for medications and treatment plan prescribed today were discussed, and the patient expressed understanding of the given instructions. Patient is instructed to call or message via MyChart if he/she  has any questions or concerns regarding our treatment plan. No barriers to understanding were identified. We discussed Red Flag symptoms and signs in detail. Patient expressed understanding regarding what to do in case of urgent or emergency type symptoms.   Medication list was reconciled, printed and provided to the patient in AVS. Patient instructions and summary information was reviewed with the patient as documented in the AVS. This note was prepared with assistance of Dragon voice recognition software. Occasional wrong-word or sound-a-like substitutions may have occurred due to the inherent limitations of voice recognition software

## 2018-01-27 ENCOUNTER — Ambulatory Visit
Admission: RE | Admit: 2018-01-27 | Discharge: 2018-01-27 | Disposition: A | Discharge status: Home / Self Care | Payer: Medicare Other | Source: Ambulatory Visit | Attending: Family Medicine | Admitting: Family Medicine

## 2018-01-27 DIAGNOSIS — Z1231 Encounter for screening mammogram for malignant neoplasm of breast: Secondary | ICD-10-CM

## 2018-03-23 ENCOUNTER — Ambulatory Visit: Payer: Medicare Other | Admitting: Family Medicine

## 2018-03-27 ENCOUNTER — Other Ambulatory Visit: Payer: Self-pay

## 2018-03-27 MED ORDER — LISINOPRIL-HYDROCHLOROTHIAZIDE 20-25 MG PO TABS: 1.0000 | ORAL_TABLET | Freq: Every day | ORAL | 3 refills | Status: DC

## 2018-03-27 MED ORDER — ATORVASTATIN CALCIUM 20 MG PO TABS: 20.0000 mg | ORAL_TABLET | Freq: Every day | ORAL | Status: DC

## 2018-03-28 ENCOUNTER — Ambulatory Visit: Payer: Medicare Other | Admitting: Family Medicine

## 2018-04-25 ENCOUNTER — Ambulatory Visit: Payer: Medicare Other | Admitting: Family Medicine

## 2018-05-25 ENCOUNTER — Ambulatory Visit (INDEPENDENT_AMBULATORY_CARE_PROVIDER_SITE_OTHER): Payer: Medicare Other | Admitting: Family Medicine

## 2018-05-25 ENCOUNTER — Encounter: Payer: Self-pay | Admitting: Family Medicine

## 2018-05-25 ENCOUNTER — Other Ambulatory Visit: Payer: Self-pay

## 2018-05-25 VITALS — BP 125/85 | Temp 97.5°F | Resp 16 | Wt 197.0 lb

## 2018-05-25 DIAGNOSIS — N183 Chronic kidney disease, stage 3 (moderate): Secondary | ICD-10-CM

## 2018-05-25 DIAGNOSIS — I10 Essential (primary) hypertension: Secondary | ICD-10-CM | POA: Diagnosis not present

## 2018-05-25 DIAGNOSIS — E1122 Type 2 diabetes mellitus with diabetic chronic kidney disease: Secondary | ICD-10-CM | POA: Diagnosis not present

## 2018-05-25 DIAGNOSIS — E782 Mixed hyperlipidemia: Secondary | ICD-10-CM

## 2018-05-25 DIAGNOSIS — Z8679 Personal history of other diseases of the circulatory system: Secondary | ICD-10-CM

## 2018-05-25 DIAGNOSIS — E1169 Type 2 diabetes mellitus with other specified complication: Secondary | ICD-10-CM | POA: Diagnosis not present

## 2018-05-25 MED ORDER — METFORMIN HCL ER 750 MG PO TB24: 1500.0000 mg | ORAL_TABLET | Freq: Every day | ORAL | 3 refills | Status: DC

## 2018-05-25 MED ORDER — ATORVASTATIN CALCIUM 20 MG PO TABS: 20.0000 mg | ORAL_TABLET | Freq: Every day | ORAL | 3 refills | Status: DC

## 2018-05-25 NOTE — Assessment & Plan Note (Signed)
Suspect is running uncontrolled now. Pt really does not want to add meds. Will increase metformin dosing and urged to improve diet and exercise. Recheck in 3 months. Will add farxiga at that time if not at goal. Needs eye exam. Defers a1c at this time due to covid restrictions. On ace. On statin. Refuses vaccinations.

## 2018-05-25 NOTE — Assessment & Plan Note (Signed)
This medical condition is well controlled. There are no signs of complications, medication side effects, or red flags. Patient is instructed to continue the current treatment plan without change in therapies or medications.   

## 2018-05-25 NOTE — Progress Notes (Signed)
Virtual Visit via Video Note  Subjective  CC:  Chief Complaint  Patient presents with  . Diabetes    Her home blood sugars have been up and down.. Lowest 109 & highest 280 she usually takes in the morning only before eating. She has been taking Metformin   . Hypertension     I connected with Amy Bartlett on 05/25/18 at  1:00 PM EDT by a video enabled telemedicine application and verified that I am speaking with the correct person using two identifiers. Location patient: Home Location provider: [facility] Persons participating in the virtual visit: Amy Bartlett, Amy Ora, MD Rita Ohara, CMA  I discussed the limitations of evaluation and management by telemedicine and the availability of in person appointments. The patient expressed understanding and agreed to proceed. HPI: Amy Bartlett is a 68 y.o. female who was contacted today to address the problems listed above in the chief complaint, f/u diabetes:  Diabetes follow up: Her diabetic control is reported as worsened. fastings over the last several weeks it is >200. She never increased her metformin dose as instructed and remains on  daily. Diet could use improvement as well. Hasn't been exercising due to gym closures. She feels well however. Denies sxs of hyperglycemia.   She denies exertional CP or SOB or symptomatic hypoglycemia. She denies foot sores or paresthesias. Eye exam is postponed.  HTN f/u: doing fine on meds. Feeling well. Taking medications w/o adverse effects. No symptoms of CHF, angina; no palpitations, sob, cp or lower extremity edema. Compliant with meds.   HLD: needs refill of statin. Well tolerated and lipids are at goal.  H/o complete heart block: cards f/u has been normal. Recently interrogated pacemaker.   Immunization History  Administered Date(s) Administered  . Tdap 04/14/2011  . Zoster 04/14/2011    Diabetes Related Lab  Review: Lab Results  Component Value Date   HGBA1C 7.3 (A) 12/22/2017   HGBA1C 7.5 (A) 09/29/2017   HGBA1C 6.7 (A) 06/28/2017    Lab Results  Component Value Date   MICROALBUR <0.7 09/29/2017   Lab Results  Component Value Date   CREATININE 1.22 (H) 09/29/2017   BUN 24 (H) 09/29/2017   NA 136 09/29/2017   K 4.6 09/29/2017   CL 99 09/29/2017   CO2 30 09/29/2017   Lab Results  Component Value Date   CHOL 158 09/29/2017   Lab Results  Component Value Date   HDL 54.80 09/29/2017   Lab Results  Component Value Date   LDLCALC 75 09/29/2017   Lab Results  Component Value Date   TRIG 139.0 09/29/2017   Lab Results  Component Value Date   CHOLHDL 3 09/29/2017   No results found for: LDLDIRECT The 10-year ASCVD risk score Denman George DC Jr., et al., 2013) is: 17.8%   Values used to calculate the score:     Age: 4 years     Sex: Female     Is Non-Hispanic African American: Yes     Diabetic: Yes     Tobacco smoker: No     Systolic Blood Pressure: 125 mmHg     Is BP treated: Yes     HDL Cholesterol: 54.8 mg/dL     Total Cholesterol: 158 mg/dL  BP Readings from Last 3 Encounters:  05/25/18 125/85  12/22/17 126/88  09/29/17 120/82   Wt Readings from Last 3 Encounters:  05/25/18 197 lb (89.4 kg)  12/22/17 198  lb (89.8 kg)  09/29/17 198 lb 6.4 oz (90 kg)    Health Maintenance  Topic Date Due  . OPHTHALMOLOGY EXAM  03/25/2018  . HEMOGLOBIN A1C  06/23/2018  . FOOT EXAM  06/29/2018  . MAMMOGRAM  01/28/2019  . DEXA SCAN  04/22/2019  . TETANUS/TDAP  04/13/2021  . COLONOSCOPY  02/22/2027  . INFLUENZA VACCINE  Discontinued  . Hepatitis C Screening  Discontinued    Assessment  1. Controlled type 2 diabetes mellitus with stage 3 chronic kidney disease, without long-term current use of insulin (HCC)   2. Benign essential hypertension   3. Combined hyperlipidemia associated with type 2 diabetes mellitus (HCC)   4. History of complete heart block      Plan    Diabetes is currently marginally controlled. See below for problem based assessment and plan documentation  Diabetic education: ongoing education regarding chronic disease management for diabetes was given today. We continue to reinforce the ABC's of diabetic management: A1c (<7 or 8 dependent upon patient), tight blood pressure control, and cholesterol management with goal LDL < 100 minimally. We discuss diet strategies, exercise recommendations, medication options and possible side effects. At each visit, we review recommended immunizations and preventive care recommendations for diabetics and stress that good diabetic control can prevent other problems. See below for this patient's data.  I discussed the assessment and treatment plan with the patient. The patient was provided an opportunity to ask questions and all were answered. The patient agreed with the plan and demonstrated an understanding of the instructions.   The patient was advised to call back or seek an in-person evaluation if the symptoms worsen or if the condition fails to improve as anticipated. Follow up: Return in about 3 months (around 09/05/2018) for complete physical, follow up of diabetes and hypertension.  Visit date not found  No orders of the defined types were placed in this encounter.     I reviewed the patients updated PMH, FH, and SocHx.    Patient Active Problem List   Diagnosis Date Noted  . Encounter for screening colonoscopy 02/21/2017  . CKD (chronic kidney disease) stage 3, GFR 30-59 ml/min (HCC) 10/04/2016  . Obesity (BMI 30-39.9) 01/12/2016  . Complete heart block (HCC) 11/12/2015  . Primary localized osteoarthrosis of left lower leg 07/16/2014  . History of complete heart block 10/04/2012  . Combined hyperlipidemia associated with type 2 diabetes mellitus (HCC) 11/04/2011  . Controlled type 2 diabetes mellitus with stage 3 chronic kidney disease, without long-term current use of insulin (HCC) 11/04/2011   . Age-related osteoporosis without fracture 06/01/2011  . Vitamin D deficiency 04/16/2011  . Benign essential hypertension 01/11/2011   Current Meds  Medication Sig  . alendronate (FOSAMAX) 70 MG tablet Take 1 tablet (70 mg total) by mouth every 7 (seven) days. Take with a full glass of water on an empty stomach.  Marland Kitchen. aspirin (ASPIRIN LOW DOSE) 81 MG tablet Take by mouth.  Marland Kitchen. atorvastatin (LIPITOR) 20 MG tablet Take 1 tablet (20 mg total) by mouth at bedtime.  . Cholecalciferol (D 5000) 5000 units TABS Take by mouth.  Marland Kitchen. glucose blood (ONE TOUCH ULTRA TEST) test strip CHECK BLOOD SUGAR DAILY.  Marland Kitchen. lisinopril-hydrochlorothiazide (PRINZIDE,ZESTORETIC) 20-25 MG tablet Take 1 tablet by mouth daily.  . metFORMIN (GLUCOPHAGE-XR) 750 MG 24 hr tablet Take 2 tablets (1,500 mg total) by mouth daily with breakfast. (Patient taking differently: Take 750 mg by mouth daily with breakfast. )    Allergies: Patient has No  Known Allergies. Family History: Patient family history includes Arthritis in her father; Diabetes in her brother; Glaucoma in her mother; Healthy in her daughter and daughter; Heart disease in her mother; Hypertension in her mother; Osteoporosis in her mother; Pneumonia in her father; Stroke in her brother. Social History:  Patient  reports that she has never smoked. She has never used smokeless tobacco. She reports that she does not drink alcohol or use drugs.  Review of Systems: Constitutional: Negative for fever malaise or anorexia Cardiovascular: negative for chest pain Respiratory: negative for SOB or persistent cough Gastrointestinal: negative for abdominal pain  OBJECTIVE Vitals: BP 125/85   Temp (!) 97.5 F (36.4 C) (Oral)   Resp 16   Wt 197 lb (89.4 kg)   BMI 34.90 kg/m  General: no acute distress , A&Ox3  Amy Ora, MD

## 2018-05-25 NOTE — Patient Instructions (Addendum)
Please return in 3 months for your physical and diabetes f/u.  If you have any questions or concerns, please don't hesitate to send me a message via MyChart or call the office at [phone]. Thank you for visiting with [place] today! It's our pleasure caring for you.

## 2018-05-25 NOTE — Assessment & Plan Note (Signed)
stable °

## 2018-05-25 NOTE — Progress Notes (Signed)
Please schedule

## 2018-10-04 ENCOUNTER — Other Ambulatory Visit: Payer: Self-pay

## 2018-10-04 ENCOUNTER — Ambulatory Visit (INDEPENDENT_AMBULATORY_CARE_PROVIDER_SITE_OTHER): Payer: Medicare Other

## 2018-10-04 VITALS — BP 122/74 | Temp 97.8°F | Ht 63.0 in | Wt 195.0 lb

## 2018-10-04 DIAGNOSIS — Z0000 Encounter for general adult medical examination without abnormal findings: Secondary | ICD-10-CM

## 2018-10-04 NOTE — Patient Instructions (Signed)
[name] , Thank you for taking time to come for your Medicare Wellness Visit. I appreciate your ongoing commitment to your health goals. Please review the following plan we discussed and let me know if I can assist you in the future.   Screening recommendations/referrals: Colorectal Screening: up to date; last 02/21/17 Mammogram: up to date 01/27/18 Bone Density: up to date; last 04/21/17   Vision and Dental Exams: Recommended annual ophthalmology exams for early detection of glaucoma and other disorders of the eye Recommended annual dental exams for proper oral hygiene  Diabetic Exams: Diabetic Eye Exam: we will obtain records from Dr. Dione Bartlett  Diabetic Foot Exam: at next visit   Vaccinations: Influenza vaccine: completed  Pneumococcal vaccine: recommended  Tdap vaccine: up to date; last 04/14/11   Shingles vaccine: Please call your insurance company to determine your out of pocket expense for the Shingrix vaccine. You may receive this vaccine at your local pharmacy.  Advanced directives: Advance directives discussed with you today. I have provided a copy for you to complete at home and have notarized. Once this is complete please bring a copy in to our office so we can scan it into your chart.  Goals: Recommend to drink at least 6-8 8oz glasses of water per day.  Next appointment: Please schedule your Annual Wellness Visit with your Nurse Health Advisor in one year.  Preventive Care 3565 Years and Older, Female Preventive care refers to lifestyle choices and visits with your health care provider that can promote health and wellness. What does preventive care include?  A yearly physical exam. This is also called an annual well check.  Dental exams once or twice a year.  Routine eye exams. Ask your health care provider how often you should have your eyes checked.  Personal lifestyle choices, including:  Daily care of your teeth and gums.  Regular physical activity.  Eating a  healthy diet.  Avoiding tobacco and drug use.  Limiting alcohol use.  Practicing safe sex.  Taking low-dose aspirin every day if recommended by your health care provider.  Taking vitamin and mineral supplements as recommended by your health care provider. What happens during an annual well check? The services and screenings done by your health care provider during your annual well check will depend on your age, overall health, lifestyle risk factors, and family history of disease. Counseling  Your health care provider may ask you questions about your:  Alcohol use.  Tobacco use.  Drug use.  Emotional well-being.  Home and relationship well-being.  Sexual activity.  Eating habits.  History of falls.  Memory and ability to understand (cognition).  Work and work Astronomerenvironment.  Reproductive health. Screening  You may have the following tests or measurements:  Height, weight, and BMI.  Blood pressure.  Lipid and cholesterol levels. These may be checked every 5 years, or more frequently if you are over 67 years old.  Skin check.  Lung cancer screening. You may have this screening every year starting at age 67 if you have a 30-pack-year history of smoking and currently smoke or have quit within the past 15 years.  Fecal occult blood test (FOBT) of the stool. You may have this test every year starting at age 67.  Flexible sigmoidoscopy or colonoscopy. You may have a sigmoidoscopy every 5 years or a colonoscopy every 10 years starting at age 650.  Hepatitis C blood test.  Hepatitis B blood test.  Sexually transmitted disease (STD) testing.  Diabetes screening. This  is done by checking your blood sugar (glucose) after you have not eaten for a while (fasting). You may have this done every 1-3 years.  Bone density scan. This is done to screen for osteoporosis. You may have this done starting at age 38.  Mammogram. This may be done every 1-2 years. Talk to your health  care provider about how often you should have regular mammograms. Talk with your health care provider about your test results, treatment options, and if necessary, the need for more tests. Vaccines  Your health care provider may recommend certain vaccines, such as:  Influenza vaccine. This is recommended every year.  Tetanus, diphtheria, and acellular pertussis (Tdap, Td) vaccine. You may need a Td booster every 10 years.  Zoster vaccine. You may need this after age 21.  Pneumococcal 13-valent conjugate (PCV13) vaccine. One dose is recommended after age 33.  Pneumococcal polysaccharide (PPSV23) vaccine. One dose is recommended after age 78. Talk to your health care provider about which screenings and vaccines you need and how often you need them. This information is not intended to replace advice given to you by your health care provider. Make sure you discuss any questions you have with your health care provider. Document Released: 01/17/2015 Document Revised: 09/10/2015 Document Reviewed: 10/22/2014 Elsevier Interactive Patient Education  2017 [employer].  Fall Prevention in the Home Falls can cause injuries. They can happen to people of all ages. There are many things you can do to make your home safe and to help prevent falls. What can I do on the outside of my home?  Regularly fix the edges of walkways and driveways and fix any cracks.  Remove anything that might make you trip as you walk through a door, such as a raised step or threshold.  Trim any bushes or trees on the path to your home.  Use bright outdoor lighting.  Clear any walking paths of anything that might make someone trip, such as rocks or tools.  Regularly check to see if handrails are loose or broken. Make sure that both sides of any steps have handrails.  Any raised decks and porches should have guardrails on the edges.  Have any leaves, snow, or ice cleared regularly.  Use sand or salt on walking paths  during winter.  Clean up any spills in your garage right away. This includes oil or grease spills. What can I do in the bathroom?  Use night lights.  Install grab bars by the toilet and in the tub and shower. Do not use towel bars as grab bars.  Use non-skid mats or decals in the tub or shower.  If you need to sit down in the shower, use a plastic, non-slip stool.  Keep the floor dry. Clean up any water that spills on the floor as soon as it happens.  Remove soap buildup in the tub or shower regularly.  Attach bath mats securely with double-sided non-slip rug tape.  Do not have throw rugs and other things on the floor that can make you trip. What can I do in the bedroom?  Use night lights.  Make sure that you have a light by your bed that is easy to reach.  Do not use any sheets or blankets that are too big for your bed. They should not hang down onto the floor.  Have a firm chair that has side arms. You can use this for support while you get dressed.  Do not have throw rugs and other  things on the floor that can make you trip. What can I do in the kitchen?  Clean up any spills right away.  Avoid walking on wet floors.  Keep items that you use a lot in easy-to-reach places.  If you need to reach something above you, use a strong step stool that has a grab bar.  Keep electrical cords out of the way.  Do not use floor polish or wax that makes floors slippery. If you must use wax, use non-skid floor wax.  Do not have throw rugs and other things on the floor that can make you trip. What can I do with my stairs?  Do not leave any items on the stairs.  Make sure that there are handrails on both sides of the stairs and use them. Fix handrails that are broken or loose. Make sure that handrails are as long as the stairways.  Check any carpeting to make sure that it is firmly attached to the stairs. Fix any carpet that is loose or worn.  Avoid having throw rugs at the top  or bottom of the stairs. If you do have throw rugs, attach them to the floor with carpet tape.  Make sure that you have a light switch at the top of the stairs and the bottom of the stairs. If you do not have them, ask someone to add them for you. What else can I do to help prevent falls?  Wear shoes that:  Do not have high heels.  Have rubber bottoms.  Are comfortable and fit you well.  Are closed at the toe. Do not wear sandals.  If you use a stepladder:  Make sure that it is fully opened. Do not climb a closed stepladder.  Make sure that both sides of the stepladder are locked into place.  Ask someone to hold it for you, if possible.  Clearly mark and make sure that you can see:  Any grab bars or handrails.  First and last steps.  Where the edge of each step is.  Use tools that help you move around (mobility aids) if they are needed. These include:  Canes.  Walkers.  Scooters.  Crutches.  Turn on the lights when you go into a dark area. Replace any light bulbs as soon as they burn out.  Set up your furniture so you have a clear path. Avoid moving your furniture around.  If any of your floors are uneven, fix them.  If there are any pets around you, be aware of where they are.  Review your medicines with your doctor. Some medicines can make you feel dizzy. This can increase your chance of falling. Ask your doctor what other things that you can do to help prevent falls. This information is not intended to replace advice given to you by your health care provider. Make sure you discuss any questions you have with your health care provider. Document Released: 10/17/2008 Document Revised: 05/29/2015 Document Reviewed: 01/25/2014 Elsevier Interactive Patient Education  2017 [employer].

## 2018-10-04 NOTE — Progress Notes (Signed)
I have reviewed the documentation from the recent AWV done by Courtney Slade, RN; I agree with the documentation and will follow up on any recommendations or abnormal findings as suggested.  

## 2018-10-04 NOTE — Progress Notes (Signed)
Subjective:   Amy Bartlett is a 68 y.o. female who presents for an Initial Medicare Annual Wellness Visit.  Review of Systems      Cardiac Risk Factors include: advanced age (>[id number], 54>65 women);diabetes mellitus;hypertension;dyslipidemia     Objective:    Today's Vitals   10/04/18 0920  BP: 122/74  Temp: 97.8 F (36.6 C)  TempSrc: Temporal  Weight: 195 lb (88.5 kg)  Height: 5\' 3"  (1.6 m)   Body mass index is 34.54 kg/m.  Advanced Directives 10/04/2018  Does Patient Have a Medical Advance Directive? No  Would patient like information on creating a medical advance directive? Yes (MAU/Ambulatory/Procedural Areas - Information given)    Current Medications (verified) Outpatient Encounter Medications as of 10/04/2018  Medication Sig  . aspirin (ASPIRIN LOW DOSE) 81 MG tablet Take by mouth.  Marland Kitchen. atorvastatin (LIPITOR) 20 MG tablet Take 1 tablet (20 mg total) by mouth at bedtime.  . Cholecalciferol (D 5000) 5000 units TABS Take by mouth.  Marland Kitchen. glucose blood (ONE TOUCH ULTRA TEST) test strip CHECK BLOOD SUGAR DAILY.  Marland Kitchen. lisinopril-hydrochlorothiazide (PRINZIDE,ZESTORETIC) 20-25 MG tablet Take 1 tablet by mouth daily.  . metFORMIN (GLUCOPHAGE-XR) 750 MG 24 hr tablet Take 2 tablets (1,500 mg total) by mouth daily with breakfast.  . alendronate (FOSAMAX) 70 MG tablet Take 1 tablet (70 mg total) by mouth every 7 (seven) days. Take with a full glass of water on an empty stomach. (Patient not taking: Reported on 10/04/2018)   No facility-administered encounter medications on file as of 10/04/2018.     Allergies (verified) Patient has no known allergies.   History: Past Medical History:  Diagnosis Date  . Arthritis   . Chicken pox   . Diabetes mellitus (HCC)   . Hyperlipidemia   . Hypertension   . Osteopenia    Past Surgical History:  Procedure Laterality Date  . CARDIAC PACEMAKER PLACEMENT    . CESAREAN SECTION  1974, 1977, 1979  . REDUCTION MAMMAPLASTY Bilateral  2009  . TUBAL LIGATION     Family History  Problem Relation Age of Onset  . Glaucoma Mother   . Heart disease Mother        Had Pacemaker, Died on CHF   . Hypertension Mother   . Osteoporosis Mother   . Pneumonia Father   . Arthritis Father   . Diabetes Brother   . Stroke Brother   . Healthy Daughter   . Healthy Daughter    Social History   Socioeconomic History  . Marital status: Married    Spouse name: Not on file  . Number of children: Not on file  . Years of education: Not on file  . Highest education level: Not on file  Occupational History  . Not on file  Social Needs  . Financial resource strain: Not on file  . Food insecurity    Worry: Not on file    Inability: Not on file  . Transportation needs    Medical: Not on file    Non-medical: Not on file  Tobacco Use  . Smoking status: Never Smoker  . Smokeless tobacco: Never Used  Substance and Sexual Activity  . Alcohol use: Never    Frequency: Never  . Drug use: Never  . Sexual activity: Not Currently  Lifestyle  . Physical activity    Days per week: Not on file    Minutes per session: Not on file  . Stress: Not on file  Relationships  . Social connections  Talks on phone: Not on file    Gets together: Not on file    Attends religious service: Not on file    Active member of club or organization: Not on file    Attends meetings of clubs or organizations: Not on file    Relationship status: Not on file  Other Topics Concern  . Not on file  Social History Narrative   Has 3 children- 2 living in [place] and 1 in [place]     Tobacco Counseling Counseling given: Not Answered   Clinical Intake:  Pre-visit preparation completed: Yes  Pain : No/denies pain  Diabetes: Yes CBG done?: No Did pt. bring in CBG monitor from home?: No  How often do you need to have someone help you when you read instructions, pamphlets, or other written materials from your doctor or pharmacy?: 1 - Never   Interpreter Needed?: No  Information entered by :: Kandis Fantasia LPN   Activities of Daily Living In your present state of health, do you have any difficulty performing the following activities: 10/04/2018  Hearing? N  Vision? N  Difficulty concentrating or making decisions? N  Walking or climbing stairs? N  Dressing or bathing? N  Doing errands, shopping? N  Preparing Food and eating ? N  Using the Toilet? N  In the past six months, have you accidently leaked urine? N  Do you have problems with loss of bowel control? N  Managing your Medications? N  Managing your Finances? N  Housekeeping or managing your Housekeeping? N  Some recent data might be hidden     Immunizations and Health Maintenance Immunization History  Administered Date(s) Administered  . Fluad Quad(high Dose 65+) 09/08/2018  . Tdap 04/14/2011  . Zoster 04/14/2011   Health Maintenance Due  Topic Date Due  . HEMOGLOBIN A1C  06/23/2018  . FOOT EXAM  06/29/2018    Patient Care Team: Willow Ora, MD as PCP - General (Family Medicine) Ernesto Rutherford, MD as Consulting Physician (Ophthalmology) Rosey Bath Corrie Mckusick, MD as Referring Physician (Nephrology) Mayme Genta Theotis Barrio, MD as Referring Physician (Cardiology) Dr. Albin Felling (Dentistry)  Indicate any recent Medical Services you may have received from other than [facility] providers in the past year (date may be approximate).     Assessment:   This is a routine wellness examination for Mather.  Hearing/Vision screen No exam data present  Dietary issues and exercise activities discussed: Current Exercise Habits: Home exercise routine, Type of exercise: walking;Other - see comments(stationary bike), Time (Minutes): 30, Frequency (Times/Week): 3, Weekly Exercise (Minutes/Week): 90, Intensity: Mild  Goals   None    Depression Screen PHQ 2/9 Scores 10/04/2018 05/25/2018 06/28/2017  PHQ - 2 Score 0 0 0    Fall Risk Fall Risk  10/04/2018 05/25/2018 06/28/2017   Falls in the past year? 0 0 Yes  Number falls in past yr: 0 0 1  Comment - - December 2018  Injury with Fall? 0 0 Yes  Comment - - Hurt left shoulder, slipped in snow  Follow up Falls evaluation completed;Education provided;Falls prevention discussed Falls evaluation completed -    Is the patient's home free of loose throw rugs in walkways, pet beds, electrical cords, etc?   yes      Grab bars in the bathroom? yes      Handrails on the stairs?   yes      Adequate lighting?   yes  Timed Get Up and Go Performed completed and within normal timeframe; no gait  abnormalities noted    Cognitive Function: no cognitive concerns at this time  MMSE - Mini Mental State Exam 10/04/2018  Orientation to time 5  Orientation to Place 5  Registration 3  Attention/ Calculation 5  Recall 3  Language- name 2 objects 2  Language- repeat 1  Language- follow 3 step command 3  Language- read & follow direction 1  Write a sentence 1  Copy design 1  Total score 30        Screening Tests Health Maintenance  Topic Date Due  . HEMOGLOBIN A1C  06/23/2018  . FOOT EXAM  06/29/2018  . OPHTHALMOLOGY EXAM  10/04/2018 (Originally 03/25/2018)  . MAMMOGRAM  01/28/2019  . DEXA SCAN  04/22/2019  . TETANUS/TDAP  04/13/2021  . COLONOSCOPY  02/22/2027  . INFLUENZA VACCINE  Discontinued  . Hepatitis C Screening  Discontinued    Qualifies for Shingles Vaccine? Discussed and patient will check with pharmacy for coverage.  Patient education handout provided    Cancer Screenings: Lung: Low Dose CT Chest recommended if Age 36-80 years, 30 pack-year currently smoking OR have quit w/in 15years. Patient does not qualify. Breast: Up to date on Mammogram? Yes   Up to date of Bone Density/Dexa? Yes Colorectal: colonoscopy 02/21/17 with Dr. Darlen Round    Plan:  I have personally reviewed and addressed the Medicare Annual Wellness questionnaire and have noted the following in the patient's chart:  A. Medical and  social history B. Use of alcohol, tobacco or illicit drugs  C. Current medications and supplements D. Functional ability and status E.  Nutritional status F.  Physical activity G. Advance directives H. List of other physicians I.  Hospitalizations, surgeries, and ER visits in previous 12 months J.  [place] such as hearing and vision if needed, cognitive and depression L. Referrals, records requested, and appointments- will request notes from last diabetic eye exam   In addition, I have reviewed and discussed with patient certain preventive protocols, quality metrics, and best practice recommendations. A written personalized care plan for preventive services as well as general preventive health recommendations were provided to patient.   Signed,  Denman George, LPN  Nurse Health Advisor   Nurse Notes: no additional

## 2018-12-20 ENCOUNTER — Other Ambulatory Visit: Payer: Self-pay

## 2018-12-21 ENCOUNTER — Other Ambulatory Visit: Payer: Self-pay | Admitting: Family Medicine

## 2018-12-21 ENCOUNTER — Encounter: Payer: Self-pay | Admitting: Family Medicine

## 2018-12-21 ENCOUNTER — Ambulatory Visit (INDEPENDENT_AMBULATORY_CARE_PROVIDER_SITE_OTHER): Payer: Medicare Other | Admitting: Family Medicine

## 2018-12-21 VITALS — BP 122/78 | HR 91 | Temp 96.8°F | Ht 63.0 in | Wt 198.2 lb

## 2018-12-21 DIAGNOSIS — Z8679 Personal history of other diseases of the circulatory system: Secondary | ICD-10-CM | POA: Diagnosis not present

## 2018-12-21 DIAGNOSIS — I10 Essential (primary) hypertension: Secondary | ICD-10-CM | POA: Diagnosis not present

## 2018-12-21 DIAGNOSIS — IMO0002 Reserved for concepts with insufficient information to code with codable children: Secondary | ICD-10-CM

## 2018-12-21 DIAGNOSIS — Z1231 Encounter for screening mammogram for malignant neoplasm of breast: Secondary | ICD-10-CM

## 2018-12-21 DIAGNOSIS — E1169 Type 2 diabetes mellitus with other specified complication: Secondary | ICD-10-CM

## 2018-12-21 DIAGNOSIS — M818 Other osteoporosis without current pathological fracture: Secondary | ICD-10-CM | POA: Diagnosis not present

## 2018-12-21 DIAGNOSIS — E1165 Type 2 diabetes mellitus with hyperglycemia: Secondary | ICD-10-CM | POA: Diagnosis not present

## 2018-12-21 DIAGNOSIS — E782 Mixed hyperlipidemia: Secondary | ICD-10-CM

## 2018-12-21 DIAGNOSIS — N183 Chronic kidney disease, stage 3 unspecified: Secondary | ICD-10-CM | POA: Diagnosis not present

## 2018-12-21 DIAGNOSIS — Z0000 Encounter for general adult medical examination without abnormal findings: Secondary | ICD-10-CM

## 2018-12-21 DIAGNOSIS — E1122 Type 2 diabetes mellitus with diabetic chronic kidney disease: Secondary | ICD-10-CM | POA: Diagnosis not present

## 2018-12-21 DIAGNOSIS — E559 Vitamin D deficiency, unspecified: Secondary | ICD-10-CM | POA: Diagnosis not present

## 2018-12-21 DIAGNOSIS — E669 Obesity, unspecified: Secondary | ICD-10-CM

## 2018-12-21 LAB — CBC WITH DIFFERENTIAL/PLATELET
Basophils Absolute: 0 10*3/uL (ref 0.0–0.1)
Basophils Relative: 0.9 % (ref 0.0–3.0)
Eosinophils Absolute: 0.1 10*3/uL (ref 0.0–0.7)
Eosinophils Relative: 2.2 % (ref 0.0–5.0)
HCT: 37.6 % (ref 36.0–46.0)
Hemoglobin: 12.6 g/dL (ref 12.0–15.0)
Lymphocytes Relative: 40.8 % (ref 12.0–46.0)
Lymphs Abs: 1.8 10*3/uL (ref 0.7–4.0)
MCHC: 33.4 g/dL (ref 30.0–36.0)
MCV: 81.9 fl (ref 78.0–100.0)
Monocytes Absolute: 0.3 10*3/uL (ref 0.1–1.0)
Monocytes Relative: 7.9 % (ref 3.0–12.0)
Neutro Abs: 2.1 10*3/uL (ref 1.4–7.7)
Neutrophils Relative %: 48.2 % (ref 43.0–77.0)
Platelets: 280 10*3/uL (ref 150.0–400.0)
RBC: 4.59 Mil/uL (ref 3.87–5.11)
RDW: 15.3 % (ref 11.5–15.5)
WBC: 4.3 10*3/uL (ref 4.0–10.5)

## 2018-12-21 LAB — LIPID PANEL
Cholesterol: 158 mg/dL (ref 0–200)
HDL: 47 mg/dL (ref >39.00)
LDL Cholesterol: 77 mg/dL (ref 0–99)
NonHDL: 111.14
Total CHOL/HDL Ratio: 3
Triglycerides: 172 mg/dL — ABNORMAL HIGH (ref 0.0–149.0)
VLDL: 34.4 mg/dL (ref 0.0–40.0)

## 2018-12-21 LAB — COMPREHENSIVE METABOLIC PANEL
ALT: 16 U/L (ref 0–35)
AST: 16 U/L (ref 0–37)
Albumin: 4.3 g/dL (ref 3.5–5.2)
Alkaline Phosphatase: 61 U/L (ref 39–117)
BUN: 17 mg/dL (ref 6–23)
CO2: 28 mEq/L (ref 19–32)
Calcium: 9.4 mg/dL (ref 8.4–10.5)
Chloride: 97 mEq/L (ref 96–112)
Creatinine, Ser: 1.09 mg/dL (ref 0.40–1.20)
GFR: 60.42 mL/min (ref >60.00)
Glucose, Bld: 175 mg/dL — ABNORMAL HIGH (ref 70–99)
Potassium: 4.6 mEq/L (ref 3.5–5.1)
Sodium: 132 mEq/L — ABNORMAL LOW (ref 135–145)
Total Bilirubin: 0.5 mg/dL (ref 0.2–1.2)
Total Protein: 7.7 g/dL (ref 6.0–8.3)

## 2018-12-21 LAB — MICROALBUMIN / CREATININE URINE RATIO
Creatinine,U: 40.5 mg/dL
Microalb Creat Ratio: 1.7 mg/g (ref 0.0–30.0)
Microalb, Ur: <0.7 mg/dL (ref 0.0–1.9)

## 2018-12-21 LAB — POCT GLYCOSYLATED HEMOGLOBIN (HGB A1C): Hemoglobin A1C: 7.9 % — AB (ref 4.0–5.6)

## 2018-12-21 LAB — VITAMIN D 25 HYDROXY (VIT D DEFICIENCY, FRACTURES): VITD: 33.36 ng/mL (ref 30.00–100.00)

## 2018-12-21 LAB — TSH: TSH: 2.23 u[IU]/mL (ref 0.35–4.50)

## 2018-12-21 MED ORDER — JARDIANCE 10 MG PO TABS: 10.0000 mg | ORAL_TABLET | Freq: Every day | ORAL | 5 refills | Status: DC

## 2018-12-21 NOTE — Progress Notes (Signed)
Subjective  Chief Complaint  Patient presents with  . Annual Exam  . Diabetes  . Hypertension  . Hyperlipidemia    HPI: Amy Bartlett is a 68 y.o. female who presents to [facility] [place] today for a Female Wellness Visit. She also has the concerns and/or needs as listed above in the chief complaint. These will be addressed in addition to the Health Maintenance Visit.   Wellness Visit: annual visit with health maintenance review and exam without Pap   HM: overdue diabetic eye exam. Mammogram due next month. Pt to schedule. Weight is unchanged. Diet is fair. No exercise due to closed gym/covid restrictions. Feels fine.  Imms: due prevnar and pneumovac but still declines. She did take the flu shot this year and will want the covid-19 vaccination once available .   Hard year due to multiple deaths in her family: brother, cousins and could not attend funerals or gather with family due to covid. Reports she is managing/coping ok.   Chronic disease f/u and/or acute problem visit: (deemed necessary to be done in addition to the wellness visit):  Diabetes follow up: Her diabetic control is reported as Worse. Last checked a year ago! She has been hesitant to add medications. Reports fastings 160-200s.  She denies exertional CP or SOB or symptomatic hypoglycemia. She denies foot sores or paresthesias. Due eye exam. Takes metformin only. She did increase the dose to 1588m xr daily. Tolerating fine.   Immunization History  Administered Date(s) Administered  . Fluad Quad(high Dose 65+) 09/08/2018  . Tdap 04/14/2011  . Zoster 04/14/2011    Diabetes Related Lab Review: Lab Results  Component Value Date   HGBA1C 7.9 (A) 12/21/2018   HGBA1C 7.3 (A) 12/22/2017   HGBA1C 7.5 (A) 09/29/2017    Lab Results  Component Value Date   MICROALBUR <0.7 09/29/2017   Lab Results  Component Value Date   CREATININE 1.22 (H) 09/29/2017   BUN 24 (H) 09/29/2017   NA 136  09/29/2017   K 4.6 09/29/2017   CL 99 09/29/2017   CO2 30 09/29/2017   Lab Results  Component Value Date   CHOL 158 09/29/2017   Lab Results  Component Value Date   HDL 54.80 09/29/2017   Lab Results  Component Value Date   LDLCALC 75 09/29/2017   Lab Results  Component Value Date   TRIG 139.0 09/29/2017   Lab Results  Component Value Date   CHOLHDL 3 09/29/2017   No results found for: LDLDIRECT The 10-year ASCVD risk score (Mikey BussingDC Jr., et al., 2013) is: 17%   Values used to calculate the score:     Age: 89years     Sex: Female     Is Non-Hispanic African American: Yes     Diabetic: Yes     Tobacco smoker: No     Systolic Blood Pressure: 1440mmHg     Is BP treated: Yes     HDL Cholesterol: 54.8 mg/dL     Total Cholesterol: 158 mg/dL  BP Readings from Last 3 Encounters:  12/21/18 122/78  10/04/18 122/74  05/25/18 125/85   Wt Readings from Last 3 Encounters:  12/21/18 198 lb 3.2 oz (89.9 kg)  10/04/18 195 lb (88.5 kg)  05/25/18 197 lb (89.4 kg)    Health Maintenance  Topic Date Due  . OPHTHALMOLOGY EXAM  03/25/2018  . FOOT EXAM  06/29/2018  . MAMMOGRAM  01/28/2019  . DEXA SCAN  04/22/2019  .  HEMOGLOBIN A1C  06/21/2019  . TETANUS/TDAP  04/13/2021  . COLONOSCOPY  02/22/2027  . INFLUENZA VACCINE  Discontinued  . Hepatitis C Screening  Discontinued     Assessment  1. Annual physical exam   2. Uncontrolled diabetes mellitus with stage 3 chronic kidney disease, without long-term current use of insulin ([place])   3. Combined hyperlipidemia associated with type 2 diabetes mellitus ([place])   4. History of complete heart block   5. Age-related osteoporosis without fracture   6. Benign essential hypertension   7. Obesity (BMI 30-39.9)   8. Vitamin D deficiency      Plan  Female Wellness Visit:  Age appropriate Health Maintenance and Prevention measures were discussed with patient. Included topics are cancer screening recommendations, ways to keep healthy  (see AVS) including dietary and exercise recommendations, regular eye and dental care, use of seat belts, and avoidance of moderate alcohol use and tobacco use. mammo next month.   BMI: discussed patient's BMI and encouraged positive lifestyle modifications to help get to or maintain a target BMI.  HM needs and immunizations were addressed and ordered. See below for orders. See HM and immunization section for updates. rec prevnar/pneuomovax. Pt to think about it.   Routine labs and screening tests ordered including cmp, cbc and lipids where appropriate.  Discussed recommendations regarding Vit D and calcium supplementation (see AVS)  Chronic disease management visit and/or acute problem visit:  Uncontrolled DM: counseling and education done. Discussed goals of care and ways to achieve them. Will add [id number]-I; will need to monitor renal function and potassium. Discussed possible side effects including yeast infection. Continue metformin at current dose. Recommend eye exam. On ace/statin/asa  HTN is controlled. Check renal function and electrolytes.  HLD: has been controlled on statin. Recheck today with lfts. Tolerates well  H/o heart block with stable cardiology f/u  Osteoporosis: encouraged pt to restart fosamax to prevent further bone loss. Will set alexa to remind her weekly for better compliance. Recheck dexa next year. Cont d and calcium.   AWV reviewed. Nl MMSE and neg depression.  Follow up: Return in about 3 months (around 03/21/2019) for follow up of diabetes and hypertension.  Orders Placed This Encounter  Procedures  . CBC w/Diff  . CMP  . Lipids  . TSH  . Vit D 25OH  . Urine MAC  . A1C POCT   Meds ordered this encounter  Medications  . empagliflozin (JARDIANCE) 10 MG TABS tablet    Sig: Take 10 mg by mouth daily.    Dispense:  30 tablet    Refill:  5      Lifestyle: Body mass index is 35.11 kg/m. Wt Readings from Last 3 Encounters:  12/21/18 198 lb 3.2 oz  (89.9 kg)  10/04/18 195 lb (88.5 kg)  05/25/18 197 lb (89.4 kg)    Patient Active Problem List   Diagnosis Date Noted  . Encounter for screening colonoscopy 02/21/2017    2/19 colon - no polyps Recommend screening exam in 2/29 - [facility]   . CKD (chronic kidney disease) stage 3, GFR 30-59 ml/min 10/04/2016  . Obesity (BMI 30-39.9) 01/12/2016  . Primary localized osteoarthrosis of left lower leg 07/16/2014  . History of complete heart block 10/04/2012    Overview:  Dr. Georg Ruddle, S/p pacemaker, stable 2014/cla No ischemia by stress testing 2011   . Combined hyperlipidemia associated with type 2 diabetes mellitus ([place]) 11/04/2011  . Controlled type 2 diabetes mellitus with stage 3 chronic kidney disease, without  long-term current use of insulin ([place]) 11/04/2011  . Age-related osteoporosis without fracture 06/01/2011    T = -2.6 04/2017 in L hip. T = -1.2 05/2011; recheck at age 89.   . Vitamin D deficiency 04/16/2011  . Benign essential hypertension 01/11/2011   Health Maintenance  Topic Date Due  . OPHTHALMOLOGY EXAM  03/25/2018  . FOOT EXAM  06/29/2018  . MAMMOGRAM  01/28/2019  . DEXA SCAN  04/22/2019  . HEMOGLOBIN A1C  06/21/2019  . TETANUS/TDAP  04/13/2021  . COLONOSCOPY  02/22/2027  . INFLUENZA VACCINE  Discontinued  . Hepatitis C Screening  Discontinued   Immunization History  Administered Date(s) Administered  . Fluad Quad(high Dose 65+) 09/08/2018  . Tdap 04/14/2011  . Zoster 04/14/2011   We updated and reviewed the patient's past history in detail and it is documented below. Allergies: Patient has No Known Allergies. Past Medical History Patient  has a past medical history of Arthritis, Chicken pox, Diabetes mellitus ([place]), Hyperlipidemia, Hypertension, and Osteopenia. Past Surgical History Patient  has a past surgical history that includes Reduction mammaplasty (Bilateral, 2009); Cardiac pacemaker placement; Tubal ligation; and Cesarean section (1974, 1977,  1979). Family History: Patient family history includes Arthritis in her father; Diabetes in her brother; Glaucoma in her mother; Healthy in her daughter and daughter; Heart disease in her mother; Hypertension in her mother; Osteoporosis in her mother; Pneumonia in her father; Stroke in her brother. Social History:  Patient  reports that she has never smoked. She has never used smokeless tobacco. She reports that she does not drink alcohol or use drugs.  Review of Systems: Constitutional: negative for fever or malaise Ophthalmic: negative for photophobia, double vision or loss of vision Cardiovascular: negative for chest pain, dyspnea on exertion, or new LE swelling Respiratory: negative for SOB or persistent cough Gastrointestinal: negative for abdominal pain, change in bowel habits or melena Genitourinary: negative for dysuria or gross hematuria, no abnormal uterine bleeding or disharge, + polyuria Musculoskeletal: negative for new gait disturbance or muscular weakness Integumentary: negative for new or persistent rashes, no breast lumps Neurological: negative for TIA or stroke symptoms Psychiatric: negative for SI or delusions Allergic/Immunologic: negative for hives  Patient Care Team    Relationship Specialty Notifications Start End  Leamon Arnt, MD PCP - General Family Medicine  06/28/17   Clent Jacks, MD Consulting Physician Ophthalmology  06/28/17   Raina Mina, MD Referring Physician Nephrology  06/28/17   Marlane Hatcher, MD Referring Physician Cardiology  06/28/17   Dr. Gerlene Burdock  Dentistry  06/28/17     Objective  Vitals: BP 122/78 (BP Location: Right Arm, Patient Position: Sitting, Cuff Size: Large)   Pulse 91   Temp (!) 96.8 F (36 C) (Temporal)   Ht _0  (1.6 m)   Wt 198 lb 3.2 oz (89.9 kg)   SpO2 96%   BMI 35.11 kg/m  General:  Well developed, well nourished, no acute distress  Psych:  Alert and orientedx3,normal mood and affect HEENT:  Normocephalic,  atraumatic, non-icteric sclera, PERRL, oropharynx is clear without mass or exudate, supple neck without adenopathy, mass or thyromegaly Cardiovascular:  Normal S1, S2, RRR without gallop, rub or murmur, nondisplaced PMI,pacer palpable in left upper chest Respiratory:  Good breath sounds bilaterally, CTAB with normal respiratory effort Gastrointestinal: normal bowel sounds, soft, non-tender, no noted masses. No HSM MSK: no deformities, contusions. Joints are without erythema or swelling. Spine and CVA region are nontender, no edema Skin:  Warm, no rashes or  suspicious lesions noted Neurologic:    Mental status is normal. CN 2-11 are normal. Gross motor and sensory exams are normal. Normal gait. No tremor Breast Exam: No mass, skin retraction or nipple discharge is appreciated in either breast. No axillary adenopathy. Fibrocystic changes are not noted Diabetic Foot Exam: Appearance - no lesions, ulcers or calluses Skin - no sigificant pallor or erythema Monofilament testing - sensitive bilaterally in following locations:  Right - Great toe, medial, central, lateral ball and posterior foot intact  Left - Great toe, medial, central, lateral ball and posterior foot intact Pulses - +2 distally bilaterally    Commons side effects, risks, benefits, and alternatives for medications and treatment plan prescribed today were discussed, and the patient expressed understanding of the given instructions. Patient is instructed to call or message via MyChart if he/she has any questions or concerns regarding our treatment plan. No barriers to understanding were identified. We discussed Red Flag symptoms and signs in detail. Patient expressed understanding regarding what to do in case of urgent or emergency type symptoms.   Medication list was reconciled, printed and provided to the patient in AVS. Patient instructions and summary information was reviewed with the patient as documented in the AVS. This note was  prepared with assistance of Dragon voice recognition software. Occasional wrong-word or sound-a-like substitutions may have occurred due to the inherent limitations of voice recognition software  This visit occurred during the SARS-CoV-2 public health emergency.  Safety protocols were in place, including screening questions prior to the visit, additional usage of staff PPE, and extensive cleaning of exam room while observing appropriate contact time as indicated for disinfecting solutions.

## 2018-12-21 NOTE — Patient Instructions (Addendum)
Please return in 3 months for diabetes follow up Please set up an appointment for a diabetic eye exam and have the results sent to me.   We will call you with your lab results.  I would like to start another diabetic medication in the SGLT2-inhibitor class. I've ordered jardiance. If it is expensive, please call your insurance company or ask your pharmacist which drug in this class is best covered on your plan.   If you have any questions or concerns, please don't hesitate to send me a message via MyChart or call the office at [phone]. Thank you for visiting with Amy Bartlett today! It's our pleasure caring for you.   Diabetes Mellitus and Standards of Medical Care Managing diabetes (diabetes mellitus) can be complicated. Your diabetes treatment may be managed by a team of health care providers, including:  A physician who specializes in diabetes (endocrinologist).  A nurse practitioner or physician assistant.  Nurses.  A diet and nutrition specialist (registered dietitian).  A certified diabetes educator (CDE).  An exercise specialist.  A pharmacist.  An eye doctor.  A foot specialist (podiatrist).  A dentist.  A primary care provider.  A mental health provider. Your health care providers follow guidelines to help you get the best quality of care. The following schedule is a general guideline for your diabetes management plan. Your health care providers may give you more specific instructions. Physical exams Upon being diagnosed with diabetes mellitus, and each year after that, your health care provider will ask about your medical and family history. He or she will also do a physical exam. Your exam may include:  Measuring your height, weight, and body mass index (BMI).  Checking your blood pressure. This will be done at every routine medical visit. Your target blood pressure may vary depending on your medical conditions, your age, and other factors.  Thyroid gland  exam.  Skin exam.  Screening for damage to your nerves (peripheral neuropathy). This may include checking the pulse in your legs and feet and checking the level of sensation in your hands and feet.  A complete foot exam to inspect the structure and skin of your feet, including checking for cuts, bruises, redness, blisters, sores, or other problems.  Screening for blood vessel (vascular) problems, which may include checking the pulse in your legs and feet and checking your temperature. Blood tests Depending on your treatment plan and your personal needs, you may have the following tests done:  HbA1c (hemoglobin A1c). This test provides information about blood sugar (glucose) control over the previous 2-3 months. It is used to adjust your treatment plan, if needed. This test will be done: ? At least 2 times a year, if you are meeting your treatment goals. ? 4 times a year, if you are not meeting your treatment goals or if treatment goals have changed.  Lipid testing, including total, LDL, and HDL cholesterol and triglyceride levels. ? The goal for LDL is less than 100 mg/dL (5.5 mmol/L). If you are at high risk for complications, the goal is less than 70 mg/dL (3.9 mmol/L). ? The goal for HDL is 40 mg/dL (2.2 mmol/L) or higher for men and 50 mg/dL (2.8 mmol/L) or higher for women. An HDL cholesterol of 60 mg/dL (3.3 mmol/L) or higher gives some protection against heart disease. ? The goal for triglycerides is less than 150 mg/dL (8.3 mmol/L).  Liver function tests.  Kidney function tests.  Thyroid function tests. Dental and eye exams  Visit your  dentist two times a year.  If you have type 1 diabetes, your health care provider may recommend an eye exam 3-5 years after you are diagnosed, and then once a year after your first exam. ? For children with type 1 diabetes, a health care provider may recommend an eye exam when your child is age 65 or older and has had diabetes for 3-5 years.  After the first exam, your child should get an eye exam once a year.  If you have type 2 diabetes, your health care provider may recommend an eye exam as soon as you are diagnosed, and then once a year after your first exam. Immunizations   The yearly flu (influenza) vaccine is recommended for everyone 6 months or older who has diabetes.  The pneumonia (pneumococcal) vaccine is recommended for everyone 2 years or older who has diabetes. If you are 17 or older, you may get the pneumonia vaccine as a series of two separate shots.  The hepatitis B vaccine is recommended for adults shortly after being diagnosed with diabetes.  Adults and children with diabetes should receive all other vaccines according to age-specific recommendations from the Centers for Disease Control and Prevention (CDC). Mental and emotional health Screening for symptoms of eating disorders, anxiety, and depression is recommended at the time of diagnosis and afterward as needed. If your screening shows that you have symptoms (positive screening result), you may need more evaluation and you may work with a mental health care provider. Treatment plan Your treatment plan will be reviewed at every medical visit. You and your health care provider will discuss:  How you are taking your medicines, including insulin.  Any side effects you are experiencing.  Your blood glucose target goals.  The frequency of your blood glucose monitoring.  Lifestyle habits, such as activity level as well as tobacco, alcohol, and substance use. Diabetes self-management education Your health care provider will assess how well you are monitoring your blood glucose levels and whether you are taking your insulin correctly. He or she may refer you to:  A certified diabetes educator to manage your diabetes throughout your life, starting at diagnosis.  A registered dietitian who can create or review your personal nutrition plan.  An exercise  specialist who can discuss your activity level and exercise plan. Summary  Managing diabetes (diabetes mellitus) can be complicated. Your diabetes treatment may be managed by a team of health care providers.  Your health care providers follow guidelines in order to help you get the best quality of care.  Standards of care including having regular physical exams, blood tests, blood pressure monitoring, immunizations, screening tests, and education about how to manage your diabetes.  Your health care providers may also give you more specific instructions based on your individual health. This information is not intended to replace advice given to you by your health care provider. Make sure you discuss any questions you have with your health care provider. Document Released: 10/18/2008 Document Revised: 09/09/2017 Document Reviewed: 09/19/2015 Elsevier Patient Education  [place].   Diabetes Mellitus and Nutrition, Adult When you have diabetes (diabetes mellitus), it is very important to have healthy eating habits because your blood sugar (glucose) levels are greatly affected by what you eat and drink. Eating healthy foods in the appropriate amounts, at about the same times every day, can help you:  Control your blood glucose.  Lower your risk of heart disease.  Improve your blood pressure.  Reach or maintain a healthy  weight. Every person with diabetes is different, and each person has different needs for a meal plan. Your health care provider may recommend that you work with a diet and nutrition specialist (dietitian) to make a meal plan that is best for you. Your meal plan may vary depending on factors such as:  The calories you need.  The medicines you take.  Your weight.  Your blood glucose, blood pressure, and cholesterol levels.  Your activity level.  Other health conditions you have, such as heart or kidney disease. How do carbohydrates affect me? Carbohydrates,  also called carbs, affect your blood glucose level more than any other type of food. Eating carbs naturally raises the amount of glucose in your blood. Carb counting is a method for keeping track of how many carbs you eat. Counting carbs is important to keep your blood glucose at a healthy level, especially if you use insulin or take certain oral diabetes medicines. It is important to know how many carbs you can safely have in each meal. This is different for every person. Your dietitian can help you calculate how many carbs you should have at each meal and for each snack. Foods that contain carbs include:  Bread, cereal, rice, pasta, and crackers.  Potatoes and corn.  Peas, beans, and lentils.  Milk and yogurt.  Fruit and juice.  Desserts, such as cakes, cookies, ice cream, and candy. How does alcohol affect me? Alcohol can cause a sudden decrease in blood glucose (hypoglycemia), especially if you use insulin or take certain oral diabetes medicines. Hypoglycemia can be a life-threatening condition. Symptoms of hypoglycemia (sleepiness, dizziness, and confusion) are similar to symptoms of having too much alcohol. If your health care provider says that alcohol is safe for you, follow these guidelines:  Limit alcohol intake to no more than 1 drink per day for nonpregnant women and 2 drinks per day for men. One drink equals 12 oz of beer, 5 oz of wine, or 1 oz of hard liquor.  Do not drink on an empty stomach.  Keep yourself hydrated with water, diet soda, or unsweetened iced tea.  Keep in mind that regular soda, juice, and other mixers may contain a lot of sugar and must be counted as carbs. What are tips for following this plan?  Reading food labels  Start by checking the serving size on the "Nutrition Facts" label of packaged foods and drinks. The amount of calories, carbs, fats, and other nutrients listed on the label is based on one serving of the item. Many items contain more than  one serving per package.  Check the total grams (g) of carbs in one serving. You can calculate the number of servings of carbs in one serving by dividing the total carbs by 15. For example, if a food has 30 g of total carbs, it would be equal to 2 servings of carbs.  Check the number of grams (g) of saturated and trans fats in one serving. Choose foods that have low or no amount of these fats.  Check the number of milligrams (mg) of salt (sodium) in one serving. Most people should limit total sodium intake to less than 2,300 mg per day.  Always check the nutrition information of foods labeled as "low-fat" or "nonfat". These foods may be higher in added sugar or refined carbs and should be avoided.  Talk to your dietitian to identify your daily goals for nutrients listed on the label. Shopping  Avoid buying canned, premade, or processed  foods. These foods tend to be high in fat, sodium, and added sugar.  Shop around the outside edge of the grocery store. This includes fresh fruits and vegetables, bulk grains, fresh meats, and fresh dairy. Cooking  Use low-heat cooking methods, such as baking, instead of high-heat cooking methods like deep frying.  Cook using healthy oils, such as olive, canola, or sunflower oil.  Avoid cooking with butter, cream, or high-fat meats. Meal planning  Eat meals and snacks regularly, preferably at the same times every day. Avoid going long periods of time without eating.  Eat foods high in fiber, such as fresh fruits, vegetables, beans, and whole grains. Talk to your dietitian about how many servings of carbs you can eat at each meal.  Eat 4-6 ounces (oz) of lean protein each day, such as lean meat, chicken, fish, eggs, or tofu. One oz of lean protein is equal to: ? 1 oz of meat, chicken, or fish. ? 1 egg. ?  cup of tofu.  Eat some foods each day that contain healthy fats, such as avocado, nuts, seeds, and fish. Lifestyle  Check your blood glucose  regularly.  Exercise regularly as told by your health care provider. This may include: ? 150 minutes of moderate-intensity or vigorous-intensity exercise each week. This could be brisk walking, biking, or water aerobics. ? Stretching and doing strength exercises, such as yoga or weightlifting, at least 2 times a week.  Take medicines as told by your health care provider.  Do not use any products that contain nicotine or tobacco, such as cigarettes and e-cigarettes. If you need help quitting, ask your health care provider.  Work with a Veterinary surgeon or diabetes educator to identify strategies to manage stress and any emotional and social challenges. Questions to ask a health care provider  Do I need to meet with a diabetes educator?  Do I need to meet with a dietitian?  What number can I call if I have questions?  When are the best times to check my blood glucose? Where to find more information:  American Diabetes Association: diabetes.org  Academy of Nutrition and Dietetics: www.eatright.[employer] of Diabetes and Digestive and Kidney Diseases (NIH): CarFli[employer].tn Summary  A healthy meal plan will help you control your blood glucose and maintain a healthy lifestyle.  Working with a diet and nutrition specialist (dietitian) can help you make a meal plan that is best for you.  Keep in mind that carbohydrates (carbs) and alcohol have immediate effects on your blood glucose levels. It is important to count carbs and to use alcohol carefully. This information is not intended to replace advice given to you by your health care provider. Make sure you discuss any questions you have with your health care provider. Document Released: 09/17/2004 Document Revised: 12/03/2016 Document Reviewed: 01/26/2016 Elsevier Patient Education  2020 [employer].

## 2018-12-22 NOTE — Progress Notes (Signed)
Please call patient: I have reviewed his/her lab results. Labs are ok: sugar is high as expected and sodium is just below normal and may be due to the bp medication. No changes are needed at this time for this. I will recheck it at her next appointment.  Start the new diabetes medication as we discussed; this will help control her diabetes.

## 2019-01-13 ENCOUNTER — Other Ambulatory Visit: Payer: Self-pay | Admitting: Family Medicine

## 2019-02-08 ENCOUNTER — Ambulatory Visit: Payer: Medicare Other

## 2019-02-14 ENCOUNTER — Other Ambulatory Visit: Payer: Self-pay

## 2019-02-14 ENCOUNTER — Ambulatory Visit
Admission: RE | Admit: 2019-02-14 | Discharge: 2019-02-14 | Disposition: A | Discharge status: Home / Self Care | Payer: Medicare Other | Source: Ambulatory Visit | Attending: Family Medicine | Admitting: Family Medicine

## 2019-02-14 DIAGNOSIS — Z1231 Encounter for screening mammogram for malignant neoplasm of breast: Secondary | ICD-10-CM | POA: Diagnosis not present

## 2019-02-27 DIAGNOSIS — H2513 Age-related nuclear cataract, bilateral: Secondary | ICD-10-CM | POA: Diagnosis not present

## 2019-02-27 DIAGNOSIS — E119 Type 2 diabetes mellitus without complications: Secondary | ICD-10-CM | POA: Diagnosis not present

## 2019-02-27 LAB — HM DIABETES EYE EXAM

## 2019-03-09 ENCOUNTER — Encounter: Payer: Self-pay | Admitting: Family Medicine

## 2019-03-19 DIAGNOSIS — Z950 Presence of cardiac pacemaker: Secondary | ICD-10-CM | POA: Insufficient documentation

## 2019-03-22 ENCOUNTER — Other Ambulatory Visit: Payer: Self-pay

## 2019-03-22 MED ORDER — ALCOHOL SWABS PADS: MEDICATED_PAD | 1 refills | Status: DC

## 2019-03-22 MED ORDER — ACCU-CHEK GUIDE W/DEVICE KIT: 1.0000 | PACK | Freq: Every day | 0 refills | Status: DC

## 2019-03-22 MED ORDER — ACCU-CHEK SOFTCLIX LANCETS MISC: 1 refills | Status: DC

## 2019-03-22 MED ORDER — ACCU-CHEK GUIDE CONTROL VI LIQD: 1.0000 | 0 refills | Status: DC

## 2019-03-22 MED ORDER — GLUCOSE BLOOD VI STRP: ORAL_STRIP | 1 refills | Status: DC

## 2019-03-29 ENCOUNTER — Ambulatory Visit: Payer: TRICARE For Life (TFL) | Admitting: Family Medicine

## 2019-04-19 ENCOUNTER — Other Ambulatory Visit: Payer: Self-pay

## 2019-04-23 ENCOUNTER — Encounter: Payer: Self-pay | Admitting: Family Medicine

## 2019-04-23 ENCOUNTER — Other Ambulatory Visit: Payer: Self-pay

## 2019-04-23 ENCOUNTER — Ambulatory Visit (INDEPENDENT_AMBULATORY_CARE_PROVIDER_SITE_OTHER): Payer: Medicare PPO | Admitting: Family Medicine

## 2019-04-23 VITALS — BP 118/82 | HR 86 | Temp 98.0°F | Resp 15 | Ht 63.0 in | Wt 196.8 lb

## 2019-04-23 DIAGNOSIS — E782 Mixed hyperlipidemia: Secondary | ICD-10-CM

## 2019-04-23 DIAGNOSIS — M818 Other osteoporosis without current pathological fracture: Secondary | ICD-10-CM | POA: Diagnosis not present

## 2019-04-23 DIAGNOSIS — N183 Chronic kidney disease, stage 3 unspecified: Secondary | ICD-10-CM

## 2019-04-23 DIAGNOSIS — I10 Essential (primary) hypertension: Secondary | ICD-10-CM | POA: Diagnosis not present

## 2019-04-23 DIAGNOSIS — E1169 Type 2 diabetes mellitus with other specified complication: Secondary | ICD-10-CM | POA: Diagnosis not present

## 2019-04-23 DIAGNOSIS — N1831 Chronic kidney disease, stage 3a: Secondary | ICD-10-CM | POA: Diagnosis not present

## 2019-04-23 DIAGNOSIS — E871 Hypo-osmolality and hyponatremia: Secondary | ICD-10-CM | POA: Diagnosis not present

## 2019-04-23 DIAGNOSIS — E1122 Type 2 diabetes mellitus with diabetic chronic kidney disease: Secondary | ICD-10-CM

## 2019-04-23 LAB — POCT GLYCOSYLATED HEMOGLOBIN (HGB A1C): Hemoglobin A1C: 7.4 % — AB (ref 4.0–5.6)

## 2019-04-23 MED ORDER — METFORMIN HCL ER 750 MG PO TB24: 1500.0000 mg | ORAL_TABLET | Freq: Every day | ORAL | 3 refills | Status: DC

## 2019-04-23 MED ORDER — ALENDRONATE SODIUM 70 MG PO TABS: ORAL_TABLET | ORAL | 3 refills | Status: DC

## 2019-04-23 MED ORDER — JARDIANCE 25 MG PO TABS: 25.0000 mg | ORAL_TABLET | Freq: Every day | ORAL | 3 refills | Status: DC

## 2019-04-23 MED ORDER — LISINOPRIL-HYDROCHLOROTHIAZIDE 20-25 MG PO TABS: 1.0000 | ORAL_TABLET | Freq: Every day | ORAL | 3 refills | Status: DC

## 2019-04-23 NOTE — Patient Instructions (Signed)
Please return in 3 months for diabetes follow up  Your diabetes is improving. I've increased the dose of the Jardiance and this should help more.  Eat well.  Take your weekly fosamax. I will defer your next dexa until next April to give the medication more time to work.   If you have any questions or concerns, please don't hesitate to send me a message via MyChart or call the office at [phone]. Thank you for visiting with [place] today! It's our pleasure caring for you.

## 2019-04-23 NOTE — Progress Notes (Signed)
Subjective  CC:  Chief Complaint  Patient presents with  . Diabetes    states that she randomly checks blood sugar at home, nothing consistent  . Hypertension    denies checking BP at home    HPI: Amy Bartlett is a 69 y.o. female who presents to the office today for follow up of diabetes and problems listed above in the chief complaint.   Diabetes follow up: Her diabetic control is reported as Unchanged. We added jardiance last visit and she has tolerated it well.  She denies exertional CP or SOB or symptomatic hypoglycemia. She denies foot sores or but has very mild occ paresthesias in toes. Declines vaccines.   HTN and weight are both a little better. Tolerating meds. Had sodium rechecked at cards office last week and it has normalized at 138. See care everywhere. Had nl cbc as well.  Pacer was replaced.   HLD on statin  Osteoporosis; still struggles to get meds in regularly.   Ckd: renal function stable with recent creat 1.18 at [facility] labs. No edema. No fatigue. No nausea.    Wt Readings from Last 3 Encounters:  04/23/19 196 lb 12.8 oz (89.3 kg)  12/21/18 198 lb 3.2 oz (89.9 kg)  10/04/18 195 lb (88.5 kg)    BP Readings from Last 3 Encounters:  04/23/19 118/82  12/21/18 122/78  10/04/18 122/74    Assessment  1. Controlled type 2 diabetes mellitus with stage 3 chronic kidney disease, without long-term current use of insulin (HCC)   2. Stage 3a chronic kidney disease   3. Combined hyperlipidemia associated with type 2 diabetes mellitus (HCC)   4. Age-related osteoporosis without fracture   5. Benign essential hypertension   6. Hyponatremia      Plan   Diabetes is currently adequately controlled. Improving. Will increase jardiance ot 25 mg daily. Continue metformin and diet. Recheck 3 months.   HTN and CKD are stable.   Hyponatremia is resolved.   Osteoporosis: will try setting reminder in her phone. Will defer dexa for one year.   Weight is down a  few pounds aswell.   Follow up: Return in about 3 months (around 07/23/2019) for follow up of diabetes and hypertension.. Orders Placed This Encounter  Procedures  . POCT glycosylated hemoglobin (Hb A1C)   Meds ordered this encounter  Medications  . alendronate (FOSAMAX) 70 MG tablet    Sig: TAKE ONE TABLET BY MOUTH EVERY 7 DAYS WITH A FULL GLASS OF WATER ON AN EMPTY STOMACH    Dispense:  12 tablet    Refill:  3  . lisinopril-hydrochlorothiazide (ZESTORETIC) 20-25 MG tablet    Sig: Take 1 tablet by mouth daily.    Dispense:  90 tablet    Refill:  3  . metFORMIN (GLUCOPHAGE-XR) 750 MG 24 hr tablet    Sig: Take 2 tablets (1,500 mg total) by mouth daily with breakfast.    Dispense:  180 tablet    Refill:  3  . empagliflozin (JARDIANCE) 25 MG TABS tablet    Sig: Take 25 mg by mouth daily before breakfast.    Dispense:  90 tablet    Refill:  3      Immunization History  Administered Date(s) Administered  . Fluad Quad(high Dose 65+) 09/08/2018  . PFIZER SARS-COV-2 Vaccination 02/11/2019, 03/04/2019  . Tdap 04/14/2011  . Zoster 04/14/2011    Diabetes Related Lab Review: Lab Results  Component Value Date   HGBA1C 7.4 (A) 04/23/2019  HGBA1C 7.9 (A) 12/21/2018   HGBA1C 7.3 (A) 12/22/2017    Lab Results  Component Value Date   MICROALBUR <0.7 12/21/2018   Lab Results  Component Value Date   CREATININE 1.09 12/21/2018   BUN 17 12/21/2018   NA 132 (L) 12/21/2018   K 4.6 12/21/2018   CL 97 12/21/2018   CO2 28 12/21/2018   Lab Results  Component Value Date   CHOL 158 12/21/2018   CHOL 158 09/29/2017   Lab Results  Component Value Date   HDL 47.00 12/21/2018   HDL 54.80 09/29/2017   Lab Results  Component Value Date   LDLCALC 77 12/21/2018   LDLCALC 75 09/29/2017   Lab Results  Component Value Date   TRIG 172.0 (H) 12/21/2018   TRIG 139.0 09/29/2017   Lab Results  Component Value Date   CHOLHDL 3 12/21/2018   CHOLHDL 3 09/29/2017   No results found  for: LDLDIRECT The 10-year ASCVD risk score Denman George DC Jr., et al., 2013) is: 16.9%   Values used to calculate the score:     Age: 31 years     Sex: Female     Is Non-Hispanic African American: Yes     Diabetic: Yes     Tobacco smoker: No     Systolic Blood Pressure: 118 mmHg     Is BP treated: Yes     HDL Cholesterol: 47 mg/dL     Total Cholesterol: 158 mg/dL I have reviewed the PMH, Fam and Soc history. Patient Active Problem List   Diagnosis Date Noted  . Cardiac pacemaker in situ 03/19/2019    Added automatically from request for surgery [id number]   . Encounter for screening colonoscopy 02/21/2017    2/19 colon - no polyps Recommend screening exam in 2/29 - [facility]   . CKD (chronic kidney disease) stage 3, GFR 30-59 ml/min 10/04/2016  . Obesity (BMI 30-39.9) 01/12/2016  . Primary localized osteoarthrosis of left lower leg 07/16/2014  . History of complete heart block 10/04/2012    Overview:  Dr. Mayme Genta, S/p pacemaker, stable 2014/cla No ischemia by stress testing 2011   . Combined hyperlipidemia associated with type 2 diabetes mellitus (HCC) 11/04/2011  . Controlled type 2 diabetes mellitus with stage 3 chronic kidney disease, without long-term current use of insulin (HCC) 11/04/2011  . Age-related osteoporosis without fracture 06/01/2011    T = -2.6 04/2017 in L hip. T = -1.2 05/2011; recheck at age 89.   . Vitamin D deficiency 04/16/2011  . Benign essential hypertension 01/11/2011    Social History: Patient  reports that she has never smoked. She has never used smokeless tobacco. She reports that she does not drink alcohol or use drugs.  Review of Systems: Ophthalmic: negative for eye pain, loss of vision or double vision Cardiovascular: negative for chest pain Respiratory: negative for SOB or persistent cough Gastrointestinal: negative for abdominal pain Genitourinary: negative for dysuria or gross hematuria MSK: negative for foot lesions Neurologic: negative  for weakness or gait disturbance  Objective  Vitals: BP 118/82   Pulse 86   Temp 98 F (36.7 C) (Temporal)   Resp 15   Ht 5\' 3"  (1.6 m)   Wt 196 lb 12.8 oz (89.3 kg)   SpO2 98%   BMI 34.86 kg/m  General: well appearing, no acute distress  Psych:  Alert and oriented, normal mood and affect Cardiovascular:  Nl S1 and S2, RRR without murmur, gallop or rub. no edema Respiratory:  Good breath sounds  bilaterally, CTAB with normal effort, no rales Foot exam: no erythema, pallor, or cyanosis visible nl proprioception and sensation to monofilament testing bilaterally, +2 distal pulses bilaterally    Diabetic education: ongoing education regarding chronic disease management for diabetes was given today. We continue to reinforce the ABC's of diabetic management: A1c (<7 or 8 dependent upon patient), tight blood pressure control, and cholesterol management with goal LDL < 100 minimally. We discuss diet strategies, exercise recommendations, medication options and possible side effects. At each visit, we review recommended immunizations and preventive care recommendations for diabetics and stress that good diabetic control can prevent other problems. See below for this patient's data.    Commons side effects, risks, benefits, and alternatives for medications and treatment plan prescribed today were discussed, and the patient expressed understanding of the given instructions. Patient is instructed to call or message via MyChart if he/she has any questions or concerns regarding our treatment plan. No barriers to understanding were identified. We discussed Red Flag symptoms and signs in detail. Patient expressed understanding regarding what to do in case of urgent or emergency type symptoms.   Medication list was reconciled, printed and provided to the patient in AVS. Patient instructions and summary information was reviewed with the patient as documented in the AVS. This note was prepared with assistance  of Dragon voice recognition software. Occasional wrong-word or sound-a-like substitutions may have occurred due to the inherent limitations of voice recognition software  This visit occurred during the SARS-CoV-2 public health emergency.  Safety protocols were in place, including screening questions prior to the visit, additional usage of staff PPE, and extensive cleaning of exam room while observing appropriate contact time as indicated for disinfecting solutions.

## 2019-05-15 ENCOUNTER — Other Ambulatory Visit: Payer: Self-pay

## 2019-05-15 ENCOUNTER — Encounter: Payer: Self-pay | Admitting: Physician Assistant

## 2019-05-15 ENCOUNTER — Ambulatory Visit (INDEPENDENT_AMBULATORY_CARE_PROVIDER_SITE_OTHER): Payer: Medicare PPO | Admitting: Physician Assistant

## 2019-05-15 VITALS — BP 164/98 | HR 83 | Temp 98.2°F | Ht 63.0 in | Wt 195.6 lb

## 2019-05-15 DIAGNOSIS — M545 Low back pain, unspecified: Secondary | ICD-10-CM

## 2019-05-15 LAB — POCT URINALYSIS DIPSTICK
Bilirubin, UA: NEGATIVE
Blood, UA: NEGATIVE
Glucose, UA: POSITIVE — AB
Ketones, UA: NEGATIVE
Leukocytes, UA: NEGATIVE
Nitrite, UA: NEGATIVE
Protein, UA: NEGATIVE
Spec Grav, UA: 1.015 (ref 1.010–1.025)
Urobilinogen, UA: 0.2 E.U./dL
pH, UA: 6 (ref 5.0–8.0)

## 2019-05-15 MED ORDER — DICLOFENAC SODIUM 75 MG PO TBEC: 75.0000 mg | DELAYED_RELEASE_TABLET | Freq: Two times a day (BID) | ORAL | 0 refills | Status: DC

## 2019-05-15 NOTE — Progress Notes (Signed)
Amy Bartlett is a 69 y.o. female here for a new problem.  I acted as a Education administrator for [employer], PA-C [employer], [place]  History of Present Illness:   Chief Complaint  Patient presents with  . Possible kidney infection    HPI   Low back pain Patient reports lower back pain that started Thursday of last week. She states that the only unusual activity she has had was cleaning her car but no other strenuous activity. She also went out of town, drove 5 hours each way. Pain was so severe that she had difficulty walking -- 10/10, but she was out of town and did not want to go to the ER. Used an Arboriculturist and tylenol. Pain is now a 6/10.   She actually stopped all of her medications as of Friday because she was afraid that some of them could be causing "kidney issues" that we contributing to her back pain.  Denies burning with urination, fever, chills, malaise.   Last kidney function panel WNL.  Past Medical History:  Diagnosis Date  . Arthritis   . Chicken pox   . Diabetes mellitus ([place])   . Hyperlipidemia   . Hypertension   . Osteopenia      Social History   Socioeconomic History  . Marital status: Married    Spouse name: Not on file  . Number of children: Not on file  . Years of education: Not on file  . Highest education level: Not on file  Occupational History  . Not on file  Tobacco Use  . Smoking status: Never Smoker  . Smokeless tobacco: Never Used  Substance and Sexual Activity  . Alcohol use: Never  . Drug use: Never  . Sexual activity: Not Currently  Other Topics Concern  . Not on file  Social History Narrative   Has 3 children- 2 living in [place] and 1 in [place] Strain:   . Difficulty of Paying Living Expenses:   Food Insecurity:   . Worried About Charity fundraiser in the Last Year:   . Arboriculturist in the Last Year:   Transportation Needs:   . Lexicographer (Medical):   Marland Kitchen Lack of Transportation (Non-Medical):   Physical Activity:   . Days of Exercise per Week:   . Minutes of Exercise per Session:   Stress:   . Feeling of Stress :   Social Connections:   . Frequency of Communication with Friends and Family:   . Frequency of Social Gatherings with Friends and Family:   . Attends Religious Services:   . Active Member of Clubs or Organizations:   . Attends Archivist Meetings:   Marland Kitchen Marital Status:   Intimate Partner Violence:   . Fear of Current or Ex-Partner:   . Emotionally Abused:   Marland Kitchen Physically Abused:   . Sexually Abused:     Past Surgical History:  Procedure Laterality Date  . CARDIAC PACEMAKER PLACEMENT    . [place]  . REDUCTION MAMMAPLASTY Bilateral 2009  . TUBAL LIGATION      Family History  Problem Relation Age of Onset  . Glaucoma Mother   . Heart disease Mother        Had Pacemaker, Died on CHF   . Hypertension Mother   . Osteoporosis Mother   . Pneumonia Father   . Arthritis Father   .  Diabetes Brother   . Stroke Brother   . Healthy Daughter   . Healthy Daughter     No Known Allergies  Current Medications:   Current Outpatient Medications:  .  Accu-Chek Softclix Lancets lancets, Use as instructed, Disp: 100 each, Rfl: 1 .  Alcohol Swabs (B-D SINGLE USE SWABS REGULAR) PADS, 1 each by Other route as directed., Disp: , Rfl:  .  Alcohol Swabs PADS, Use as directed, Disp: 100 each, Rfl: 1 .  alendronate (FOSAMAX) 70 MG tablet, TAKE ONE TABLET BY MOUTH EVERY 7 DAYS WITH A FULL GLASS OF WATER ON AN EMPTY STOMACH, Disp: 12 tablet, Rfl: 3 .  aspirin (ASPIRIN LOW DOSE) 81 MG tablet, Take by mouth., Disp: , Rfl:  .  atorvastatin (LIPITOR) 20 MG tablet, Take 1 tablet (20 mg total) by mouth at bedtime., Disp: 90 tablet, Rfl: 3 .  Blood Glucose Calibration (ACCU-CHEK GUIDE CONTROL) LIQD, 1 each by In Vitro route as directed. ACCU-CHEK L1-L2 CTRL SOL, Disp: 1 each, Rfl:  0 .  Blood Glucose Monitoring Suppl (ACCU-CHEK GUIDE) w/Device KIT, 1 each by Does not apply route daily., Disp: 1 kit, Rfl: 0 .  Cholecalciferol (D 5000) 5000 units TABS, Take by mouth., Disp: , Rfl:  .  empagliflozin (JARDIANCE) 25 MG TABS tablet, Take 25 mg by mouth daily before breakfast., Disp: 90 tablet, Rfl: 3 .  glucose blood test strip, Use as instructed, Disp: 100 each, Rfl: 1 .  lisinopril-hydrochlorothiazide (ZESTORETIC) 20-25 MG tablet, Take 1 tablet by mouth daily., Disp: 90 tablet, Rfl: 3 .  metFORMIN (GLUCOPHAGE-XR) 750 MG 24 hr tablet, Take 2 tablets (1,500 mg total) by mouth daily with breakfast., Disp: 180 tablet, Rfl: 3 .  diclofenac (VOLTAREN) 75 MG EC tablet, Take 1 tablet (75 mg total) by mouth 2 (two) times daily., Disp: 30 tablet, Rfl: 0   Review of Systems:   ROS  Negative unless otherwise specified per HPI.  Vitals:   Vitals:   05/15/19 1149  BP: (!) 164/98  Pulse: 83  Temp: 98.2 F (36.8 C)  TempSrc: Temporal  SpO2: 99%  Weight: 195 lb 9.6 oz (88.7 kg)  Height: _0  (1.6 m)     Body mass index is 34.65 kg/m.  Physical Exam:   Physical Exam Vitals and nursing note reviewed.  Constitutional:      General: She is not in acute distress.    Appearance: She is well-developed. She is not ill-appearing or toxic-appearing.  Cardiovascular:     Rate and Rhythm: Normal rate and regular rhythm.     Pulses: Normal pulses.     Heart sounds: Normal heart sounds, S1 normal and S2 normal.     Comments: No LE edema Pulmonary:     Effort: Pulmonary effort is normal.     Breath sounds: Normal breath sounds.  Musculoskeletal:     Comments: No decreased ROM 2/2 pain with flexion/extension, or rotation. Slight pain with lateral side bends. Reproducible tenderness with deep palpation to R SI joint area. No bony tenderness. No evidence of erythema, rash or ecchymosis.     Skin:    General: Skin is warm and dry.  Neurological:     Mental Status: She is alert.      GCS: GCS eye subscore is 4. GCS verbal subscore is 5. GCS motor subscore is 6.     Sensory: Sensation is intact.     Motor: Motor function is intact.  Psychiatric:        Speech:  Speech normal.        Behavior: Behavior normal. Behavior is cooperative.     Results for orders placed or performed in visit on 05/15/19  POCT Urinalysis Dipstick  Result Value Ref Range   Color, UA yellow    Clarity, UA clear    Glucose, UA Positive (A) Negative   Bilirubin, UA Negative    Ketones, UA Negative    Spec Grav, UA 1.015 1.010 - 1.025   Blood, UA Negative    pH, UA 6.0 5.0 - 8.0   Protein, UA Negative Negative   Urobilinogen, UA 0.2 0.2 or 1.0 E.U./dL   Nitrite, UA Negative    Leukocytes, UA Negative Negative   Appearance     Odor      Assessment and Plan:   Adanya was seen today for possible kidney infection.  Diagnoses and all orders for this visit:  Acute bilateral low back pain without sciatica Urine is unrevealing. Encouraged patient to resume all medications as prior prescribed. Encouraged patient to push fluids, trial diclofenac for a few days. If pain returns or worsens, recommend pursuing urgent or emergent care (she is traveling for two weeks starting tomorrow).  -     POCT Urinalysis Dipstick -     Urine Culture  Other orders -     diclofenac (VOLTAREN) 75 MG EC tablet; Take 1 tablet (75 mg total) by mouth 2 (two) times daily.    Reviewed expectations re: course of current medical issues. Discussed self-management of symptoms. Outlined signs and symptoms indicating need for more acute intervention. Patient verbalized understanding and all questions were answered. See orders for this visit as documented in the electronic medical record. Patient received an After-Visit Summary.  CMA or LPN served as scribe during this visit. History, Physical, and Plan performed by medical provider. The above documentation has been reviewed and is accurate and  complete.   Inda Coke, PA-C

## 2019-05-15 NOTE — Patient Instructions (Signed)
It was great to see you!  I have sent in oral diclofenac for you to take twice daily for a few days.   Restart all your medications!  Acute Back Pain, Adult Acute back pain is sudden and usually short-lived. It is often caused by an injury to the muscles and tissues in the back. The injury may result from:  A muscle or ligament getting overstretched or torn (strained). Ligaments are tissues that connect bones to each other. Lifting something improperly can cause a back strain.  Wear and tear (degeneration) of the spinal disks. Spinal disks are circular tissue that provides cushioning between the bones of the spine (vertebrae).  Twisting motions, such as while playing sports or doing yard work.  A hit to the back.  Arthritis. You may have a physical exam, lab tests, and imaging tests to find the cause of your pain. Acute back pain usually goes away with rest and home care. Follow these instructions at home: Managing pain, stiffness, and swelling  Take over-the-counter and prescription medicines only as told by your health care provider.  Your health care provider may recommend applying ice during the first 24-48 hours after your pain starts. To do this: ? Put ice in a plastic bag. ? Place a towel between your skin and the bag. ? Leave the ice on for 20 minutes, 2-3 times a day.  If directed, apply heat to the affected area as often as told by your health care provider. Use the heat source that your health care provider recommends, such as a moist heat pack or a heating pad. ? Place a towel between your skin and the heat source. ? Leave the heat on for 20-30 minutes. ? Remove the heat if your skin turns bright red. This is especially important if you are unable to feel pain, heat, or cold. You have a greater risk of getting burned. Activity   Do not stay in bed. Staying in bed for more than 1-2 days can delay your recovery.  Sit up and stand up straight. Avoid leaning forward when  you sit, or hunching over when you stand. ? If you work at a desk, sit close to it so you do not need to lean over. Keep your chin tucked in. Keep your neck drawn back, and keep your elbows bent at a right angle. Your arms should look like the letter "L." ? Sit high and close to the steering wheel when you drive. Add lower back (lumbar) support to your car seat, if needed.  Take short walks on even surfaces as soon as you are able. Try to increase the length of time you walk each day.  Do not sit, drive, or stand in one place for more than 30 minutes at a time. Sitting or standing for long periods of time can put stress on your back.  Do not drive or use heavy machinery while taking prescription pain medicine.  Use proper lifting techniques. When you bend and lift, use positions that put less stress on your back: ? Bonesteel your knees. ? Keep the load close to your body. ? Avoid twisting.  Exercise regularly as told by your health care provider. Exercising helps your back heal faster and helps prevent back injuries by keeping muscles strong and flexible.  Work with a physical therapist to make a safe exercise program, as recommended by your health care provider. Do any exercises as told by your physical therapist. Lifestyle  Maintain a healthy weight. Extra weight  puts stress on your back and makes it difficult to have good posture.  Avoid activities or situations that make you feel anxious or stressed. Stress and anxiety increase muscle tension and can make back pain worse. Learn ways to manage anxiety and stress, such as through exercise. General instructions  Sleep on a firm mattress in a comfortable position. Try lying on your side with your knees slightly bent. If you lie on your back, put a pillow under your knees.  Follow your treatment plan as told by your health care provider. This may include: ? Cognitive or behavioral therapy. ? Acupuncture or massage therapy. ? Meditation or  yoga. Contact a health care provider if:  You have pain that is not relieved with rest or medicine.  You have increasing pain going down into your legs or buttocks.  Your pain does not improve after 2 weeks.  You have pain at night.  You lose weight without trying.  You have a fever or chills. Get help right away if:  You develop new bowel or bladder control problems.  You have unusual weakness or numbness in your arms or legs.  You develop nausea or vomiting.  You develop abdominal pain.  You feel faint. Summary  Acute back pain is sudden and usually short-lived.  Use proper lifting techniques. When you bend and lift, use positions that put less stress on your back.  Take over-the-counter and prescription medicines and apply heat or ice as directed by your health care provider. This information is not intended to replace advice given to you by your health care provider. Make sure you discuss any questions you have with your health care provider. Document Revised: 04/11/2018 Document Reviewed: 08/04/2016 Elsevier Patient Education  [place].

## 2019-05-16 LAB — URINE CULTURE
MICRO NUMBER:: 10463869
SPECIMEN QUALITY:: ADEQUATE

## 2019-07-23 ENCOUNTER — Other Ambulatory Visit: Payer: Self-pay

## 2019-07-23 ENCOUNTER — Ambulatory Visit (INDEPENDENT_AMBULATORY_CARE_PROVIDER_SITE_OTHER): Payer: Medicare PPO | Admitting: Family Medicine

## 2019-07-23 ENCOUNTER — Encounter: Payer: Self-pay | Admitting: Family Medicine

## 2019-07-23 VITALS — BP 124/72 | HR 78 | Temp 97.7°F | Ht 63.0 in | Wt 191.4 lb

## 2019-07-23 DIAGNOSIS — E1169 Type 2 diabetes mellitus with other specified complication: Secondary | ICD-10-CM

## 2019-07-23 DIAGNOSIS — E782 Mixed hyperlipidemia: Secondary | ICD-10-CM

## 2019-07-23 DIAGNOSIS — E1122 Type 2 diabetes mellitus with diabetic chronic kidney disease: Secondary | ICD-10-CM | POA: Diagnosis not present

## 2019-07-23 DIAGNOSIS — E669 Obesity, unspecified: Secondary | ICD-10-CM | POA: Diagnosis not present

## 2019-07-23 DIAGNOSIS — M818 Other osteoporosis without current pathological fracture: Secondary | ICD-10-CM | POA: Diagnosis not present

## 2019-07-23 DIAGNOSIS — N183 Chronic kidney disease, stage 3 unspecified: Secondary | ICD-10-CM | POA: Diagnosis not present

## 2019-07-23 DIAGNOSIS — I10 Essential (primary) hypertension: Secondary | ICD-10-CM | POA: Diagnosis not present

## 2019-07-23 LAB — POCT GLYCOSYLATED HEMOGLOBIN (HGB A1C): Hemoglobin A1C: 6.9 % — AB (ref 4.0–5.6)

## 2019-07-23 MED ORDER — ATORVASTATIN CALCIUM 20 MG PO TABS: 20.0000 mg | ORAL_TABLET | Freq: Every day | ORAL | 3 refills | Status: DC

## 2019-07-23 NOTE — Patient Instructions (Addendum)
Please return in December 2021 for your annual complete physical; please come fasting and for diabetes recheck.   Thinks look good today! Get home safely.  We have not made any changes today.  I have refilled your atorvastatin (for cholesterol).   If you have any questions or concerns, please don't hesitate to send me a message via MyChart or call the office at [phone]. Thank you for visiting with [place] today! It's our pleasure caring for you.

## 2019-07-23 NOTE — Progress Notes (Signed)
Subjective  CC:  Chief Complaint  Patient presents with   Diabetes    has not checked at home   Hypertension    Denies HA, dizziness, or visual changes    HPI: Amy Bartlett is a 69 y.o. female who presents to the office today for follow up of diabetes and problems listed above in the chief complaint.   Diabetes follow up: Her diabetic control is reported as Improved. Tolerating jardiance 25 daily and metformin; we increased to max jardiance 3 months ago. Now doing water aerobics again. She denies exertional CP or SOB or symptomatic hypoglycemia. She denies foot sores but has mild left great toe paresthesias intermittently.  Weight is down a few pounds  HTN: Feeling well. We adjusted meds at last visit; todays reading is now normal. Taking medications w/o adverse effects. No symptoms of CHF, angina; no palpitations, sob, cp or lower extremity edema. Compliant with meds.   HLD: due refill statin. No myalgias  Osteoporosis: doing better taking fosamax as directed but not yet 100%.   Wt Readings from Last 3 Encounters:  07/23/19 191 lb 6.4 oz (86.8 kg)  05/15/19 195 lb 9.6 oz (88.7 kg)  04/23/19 196 lb 12.8 oz (89.3 kg)    BP Readings from Last 3 Encounters:  07/23/19 124/72  05/15/19 (!) 164/98  04/23/19 118/82    Assessment  1. Controlled type 2 diabetes mellitus with stage 3 chronic kidney disease, without long-term current use of insulin (HCC)   2. Benign essential hypertension   3. Obesity (BMI 30-39.9)   4. Age-related osteoporosis without fracture   5. Combined hyperlipidemia associated with type 2 diabetes mellitus (HCC)      Plan   Diabetes is currently well controlled. Continue same meds, diet and exercise. Monitor for worsening neuropathy sxs. Nl foot exam today  HTN is controlled.   Lipids at goal. Refilled meds  Continue fosamax. Recheck dexa next year  Keep working on weight loss.  Follow up: Return in about 5 months (around 12/23/2019)  for complete physical, follow up of diabetes and hypertension.. Orders Placed This Encounter  Procedures   POCT HgB A1C   Meds ordered this encounter  Medications   atorvastatin (LIPITOR) 20 MG tablet    Sig: Take 1 tablet (20 mg total) by mouth at bedtime.    Dispense:  90 tablet    Refill:  3      Immunization History  Administered Date(s) Administered   Fluad Quad(high Dose 65+) 09/08/2018   PFIZER SARS-COV-2 Vaccination 02/11/2019, 03/04/2019   Tdap 04/14/2011   Zoster 04/14/2011    Diabetes Related Lab Review: Lab Results  Component Value Date   HGBA1C 6.9 (A) 07/23/2019   HGBA1C 7.4 (A) 04/23/2019   HGBA1C 7.9 (A) 12/21/2018    Lab Results  Component Value Date   MICROALBUR <0.7 12/21/2018   Lab Results  Component Value Date   CREATININE 1.09 12/21/2018   BUN 17 12/21/2018   NA 132 (L) 12/21/2018   K 4.6 12/21/2018   CL 97 12/21/2018   CO2 28 12/21/2018   Lab Results  Component Value Date   CHOL 158 12/21/2018   CHOL 158 09/29/2017   Lab Results  Component Value Date   HDL 47.00 12/21/2018   HDL 54.80 09/29/2017   Lab Results  Component Value Date   LDLCALC 77 12/21/2018   LDLCALC 75 09/29/2017   Lab Results  Component Value Date   TRIG 172.0 (H) 12/21/2018   TRIG 139.0  09/29/2017   Lab Results  Component Value Date   CHOLHDL 3 12/21/2018   CHOLHDL 3 09/29/2017   No results found for: LDLDIRECT The 10-year ASCVD risk score Denman George DC Jr., et al., 2013) is: 18.6%   Values used to calculate the score:     Age: 24 years     Sex: Female     Is Non-Hispanic African American: Yes     Diabetic: Yes     Tobacco smoker: No     Systolic Blood Pressure: 124 mmHg     Is BP treated: Yes     HDL Cholesterol: 47 mg/dL     Total Cholesterol: 158 mg/dL I have reviewed the PMH, Fam and Soc history. Patient Active Problem List   Diagnosis Date Noted   Cardiac pacemaker in situ 03/19/2019    Added automatically from request for surgery  [id number]    Encounter for screening colonoscopy 02/21/2017    2/19 colon - no polyps Recommend screening exam in 2/29 - [facility]    CKD (chronic kidney disease) stage 3, GFR 30-59 ml/min 10/04/2016   Obesity (BMI 30-39.9) 01/12/2016   Primary localized osteoarthrosis of left lower leg 07/16/2014   History of complete heart block 10/04/2012    Overview:  Dr. Mayme Genta, S/p pacemaker, stable 2014/cla No ischemia by stress testing 2011    Combined hyperlipidemia associated with type 2 diabetes mellitus (HCC) 11/04/2011   Controlled type 2 diabetes mellitus with stage 3 chronic kidney disease, without long-term current use of insulin (HCC) 11/04/2011   Age-related osteoporosis without fracture 06/01/2011    T = -2.6 04/2017 in L hip. T = -1.2 05/2011; recheck at age 29.    Vitamin D deficiency 04/16/2011   Benign essential hypertension 01/11/2011    Social History: Patient  reports that she has never smoked. She has never used smokeless tobacco. She reports that she does not drink alcohol and does not use drugs.  Review of Systems: Ophthalmic: negative for eye pain, loss of vision or double vision Cardiovascular: negative for chest pain Respiratory: negative for SOB or persistent cough Gastrointestinal: negative for abdominal pain Genitourinary: negative for dysuria or gross hematuria MSK: negative for foot lesions Neurologic: negative for weakness or gait disturbance  Objective  Vitals: BP 124/72 (BP Location: Left Arm, Patient Position: Sitting, Cuff Size: Large)    Pulse 78    Temp 97.7 F (36.5 C) (Temporal)    Ht 5\' 3"  (1.6 m)    Wt 191 lb 6.4 oz (86.8 kg)    SpO2 97%    BMI 33.90 kg/m  General: well appearing, no acute distress  Psych:  Alert and oriented, normal mood and affect HEENT:  Normocephalic, atraumatic, moist mucous membranes, supple neck  Cardiovascular:  Nl S1 and S2, RRR without murmur, gallop or rub. no edema Respiratory:  Good breath sounds  bilaterally, CTAB with normal effort, no rales Skin:  Warm, no rashes Neurologic:   Mental status is normal. normal gait Foot exam: no erythema, pallor, or cyanosis visible nl proprioception and sensation to monofilament testing bilaterally, +2 distal pulses bilaterally    Diabetic education: ongoing education regarding chronic disease management for diabetes was given today. We continue to reinforce the ABC's of diabetic management: A1c (<7 or 8 dependent upon patient), tight blood pressure control, and cholesterol management with goal LDL < 100 minimally. We discuss diet strategies, exercise recommendations, medication options and possible side effects. At each visit, we review recommended immunizations and preventive care recommendations for diabetics  and stress that good diabetic control can prevent other problems. See below for this patient's data.    Commons side effects, risks, benefits, and alternatives for medications and treatment plan prescribed today were discussed, and the patient expressed understanding of the given instructions. Patient is instructed to call or message via MyChart if he/she has any questions or concerns regarding our treatment plan. No barriers to understanding were identified. We discussed Red Flag symptoms and signs in detail. Patient expressed understanding regarding what to do in case of urgent or emergency type symptoms.   Medication list was reconciled, printed and provided to the patient in AVS. Patient instructions and summary information was reviewed with the patient as documented in the AVS. This note was prepared with assistance of Dragon voice recognition software. Occasional wrong-word or sound-a-like substitutions may have occurred due to the inherent limitations of voice recognition software  This visit occurred during the SARS-CoV-2 public health emergency.  Safety protocols were in place, including screening questions prior to the visit, additional  usage of staff PPE, and extensive cleaning of exam room while observing appropriate contact time as indicated for disinfecting solutions.

## 2019-10-29 DIAGNOSIS — Z8679 Personal history of other diseases of the circulatory system: Secondary | ICD-10-CM | POA: Diagnosis not present

## 2019-10-29 DIAGNOSIS — I442 Atrioventricular block, complete: Secondary | ICD-10-CM | POA: Diagnosis not present

## 2019-10-29 DIAGNOSIS — I10 Essential (primary) hypertension: Secondary | ICD-10-CM | POA: Diagnosis not present

## 2019-11-09 ENCOUNTER — Telehealth: Payer: Self-pay

## 2019-11-09 NOTE — Telephone Encounter (Signed)
Called patient to reschedule for a physical and the next available is not until 02/25/20, but Amy Bartlett is needing a CPE before the end of the year for insurance purposes. Any way we can schedule patient in same day week of the 27th?

## 2019-11-09 NOTE — Telephone Encounter (Signed)
2 pm on 12/29

## 2019-11-12 NOTE — Telephone Encounter (Signed)
LVM asking patient to call back.  

## 2019-12-20 ENCOUNTER — Encounter: Payer: TRICARE For Life (TFL) | Admitting: Family Medicine

## 2019-12-24 ENCOUNTER — Encounter: Payer: TRICARE For Life (TFL) | Admitting: Family Medicine

## 2020-01-02 ENCOUNTER — Encounter: Payer: Self-pay | Admitting: Family Medicine

## 2020-01-02 ENCOUNTER — Ambulatory Visit (INDEPENDENT_AMBULATORY_CARE_PROVIDER_SITE_OTHER): Payer: Medicare PPO | Admitting: Family Medicine

## 2020-01-02 ENCOUNTER — Encounter: Payer: TRICARE For Life (TFL) | Admitting: Family Medicine

## 2020-01-02 ENCOUNTER — Other Ambulatory Visit: Payer: Self-pay

## 2020-01-02 VITALS — BP 130/78 | HR 85 | Temp 97.3°F | Resp 18 | Ht 63.0 in | Wt 198.4 lb

## 2020-01-02 DIAGNOSIS — E782 Mixed hyperlipidemia: Secondary | ICD-10-CM

## 2020-01-02 DIAGNOSIS — E559 Vitamin D deficiency, unspecified: Secondary | ICD-10-CM

## 2020-01-02 DIAGNOSIS — M818 Other osteoporosis without current pathological fracture: Secondary | ICD-10-CM | POA: Diagnosis not present

## 2020-01-02 DIAGNOSIS — E669 Obesity, unspecified: Secondary | ICD-10-CM

## 2020-01-02 DIAGNOSIS — R252 Cramp and spasm: Secondary | ICD-10-CM

## 2020-01-02 DIAGNOSIS — Z0000 Encounter for general adult medical examination without abnormal findings: Secondary | ICD-10-CM

## 2020-01-02 DIAGNOSIS — E1169 Type 2 diabetes mellitus with other specified complication: Secondary | ICD-10-CM

## 2020-01-02 DIAGNOSIS — I10 Essential (primary) hypertension: Secondary | ICD-10-CM

## 2020-01-02 DIAGNOSIS — N1831 Chronic kidney disease, stage 3a: Secondary | ICD-10-CM | POA: Diagnosis not present

## 2020-01-02 DIAGNOSIS — E1122 Type 2 diabetes mellitus with diabetic chronic kidney disease: Secondary | ICD-10-CM | POA: Diagnosis not present

## 2020-01-02 DIAGNOSIS — N183 Chronic kidney disease, stage 3 unspecified: Secondary | ICD-10-CM | POA: Diagnosis not present

## 2020-01-02 DIAGNOSIS — Z8679 Personal history of other diseases of the circulatory system: Secondary | ICD-10-CM | POA: Diagnosis not present

## 2020-01-02 DIAGNOSIS — Z950 Presence of cardiac pacemaker: Secondary | ICD-10-CM

## 2020-01-02 LAB — CBC WITH DIFFERENTIAL/PLATELET
Basophils Absolute: 0 10*3/uL (ref 0.0–0.1)
Basophils Relative: 1 % (ref 0.0–3.0)
Eosinophils Absolute: 0.1 10*3/uL (ref 0.0–0.7)
Eosinophils Relative: 2.7 % (ref 0.0–5.0)
HCT: 40 % (ref 36.0–46.0)
Hemoglobin: 13.1 g/dL (ref 12.0–15.0)
Lymphocytes Relative: 41.3 % (ref 12.0–46.0)
Lymphs Abs: 1.8 10*3/uL (ref 0.7–4.0)
MCHC: 32.8 g/dL (ref 30.0–36.0)
MCV: 81.7 fl (ref 78.0–100.0)
Monocytes Absolute: 0.3 10*3/uL (ref 0.1–1.0)
Monocytes Relative: 7.7 % (ref 3.0–12.0)
Neutro Abs: 2.1 10*3/uL (ref 1.4–7.7)
Neutrophils Relative %: 47.3 % (ref 43.0–77.0)
Platelets: 249 10*3/uL (ref 150.0–400.0)
RBC: 4.89 Mil/uL (ref 3.87–5.11)
RDW: 16.4 % — ABNORMAL HIGH (ref 11.5–15.5)
WBC: 4.4 10*3/uL (ref 4.0–10.5)

## 2020-01-02 LAB — LIPID PANEL
Cholesterol: 219 mg/dL — ABNORMAL HIGH (ref 0–200)
HDL: 55.7 mg/dL (ref >39.00)
NonHDL: 163.35
Total CHOL/HDL Ratio: 4
Triglycerides: 259 mg/dL — ABNORMAL HIGH (ref 0.0–149.0)
VLDL: 51.8 mg/dL — ABNORMAL HIGH (ref 0.0–40.0)

## 2020-01-02 LAB — COMPREHENSIVE METABOLIC PANEL
ALT: 16 U/L (ref 0–35)
AST: 18 U/L (ref 0–37)
Albumin: 4.4 g/dL (ref 3.5–5.2)
Alkaline Phosphatase: 50 U/L (ref 39–117)
BUN: 30 mg/dL — ABNORMAL HIGH (ref 6–23)
CO2: 29 mEq/L (ref 19–32)
Calcium: 9.2 mg/dL (ref 8.4–10.5)
Chloride: 96 mEq/L (ref 96–112)
Creatinine, Ser: 1.52 mg/dL — ABNORMAL HIGH (ref 0.40–1.20)
GFR: 34.93 mL/min — ABNORMAL LOW (ref >60.00)
Glucose, Bld: 83 mg/dL (ref 70–99)
Potassium: 3.8 mEq/L (ref 3.5–5.1)
Sodium: 132 mEq/L — ABNORMAL LOW (ref 135–145)
Total Bilirubin: 0.3 mg/dL (ref 0.2–1.2)
Total Protein: 8.2 g/dL (ref 6.0–8.3)

## 2020-01-02 LAB — LDL CHOLESTEROL, DIRECT: Direct LDL: 134 mg/dL

## 2020-01-02 LAB — MICROALBUMIN / CREATININE URINE RATIO
Creatinine,U: 47.3 mg/dL
Microalb Creat Ratio: 1.5 mg/g (ref 0.0–30.0)
Microalb, Ur: <0.7 mg/dL (ref 0.0–1.9)

## 2020-01-02 LAB — TSH: TSH: 1.32 u[IU]/mL (ref 0.35–4.50)

## 2020-01-02 LAB — VITAMIN D 25 HYDROXY (VIT D DEFICIENCY, FRACTURES): VITD: 76.07 ng/mL (ref 30.00–100.00)

## 2020-01-02 LAB — POCT GLYCOSYLATED HEMOGLOBIN (HGB A1C): Hemoglobin A1C: 7.7 % — AB (ref 4.0–5.6)

## 2020-01-02 MED ORDER — SHINGRIX 50 MCG/0.5ML IM SUSR: 0.5000 mL | Freq: Once | INTRAMUSCULAR | 0 refills | Status: AC

## 2020-01-02 NOTE — Progress Notes (Signed)
Subjective  Chief Complaint  Patient presents with  . Annual Exam    Non fasting labs  . Diabetes  . Hypertension  . Hyperlipidemia  . Osteoporosis    HPI: Amy Bartlett is a 69 y.o. female who presents to [facility] [place] today for a Female Wellness Visit. She also has the concerns and/or needs as listed above in the chief complaint. These will be addressed in addition to the Health Maintenance Visit.   Wellness Visit: annual visit with health maintenance review and exam without Pap   HM: screens are up to date.  Vaccinations are up-to-date.  Due for Shingrix Chronic disease f/u and/or acute problem visit: (deemed necessary to be done in addition to the wellness visit):  Hypertension: Does not check blood pressures at home.  Compliant with medication.  No chest pain or shortness of breath.  No lower extremity edema.  Diabetes follow up: Feels her sugars are likely higher.  Diet has been worse than normal starting back with the holiday season.  She is up 7 pounds from her last visit.  She denies polyuria or polydipsia.  She does drink a lot of water routinely.  She is no longer exercising regularly.  She denies foot symptoms She denies exertional CP or SOB or symptomatic hypoglycemia. She denies foot sores or paresthesias.  Eye exam is up-to-date.  Hyperlipidemia and osteoporosis tolerating medications for both.  Nonfasting for labs today.  No adverse effects.  Complains of foot pain, leg pain and hip pain with low back pain.  No radicular symptoms.  Likely osteoarthritis.  Pain with walking.  This limits her exercise.  She sits to rest she feels fine.  She does not take any medications for it.  No bowel or bladder incontinence.  No injuries.  He has known osteoarthritis of the knees.  Also complains of muscle cramping mainly in her feet.  She does not stretch but she stays hydrated.  Chronic kidney disease without edema, chest pain, shortness of breath  or nausea  Cardiac pacemaker in place and stable.  Has follow-up with cardiology   Immunization History  Administered Date(s) Administered  . Fluad Quad(high Dose 65+) 09/08/2018, 11/12/2019  . PFIZER SARS-COV-2 Vaccination 02/11/2019, 03/04/2019, 10/15/2019  . Tdap 04/14/2011  . Zoster 04/14/2011    Diabetes Related Lab Review: Lab Results  Component Value Date   HGBA1C 7.7 (A) 01/02/2020   HGBA1C 6.9 (A) 07/23/2019   HGBA1C 7.4 (A) 04/23/2019    Lab Results  Component Value Date   MICROALBUR <0.7 12/21/2018   Lab Results  Component Value Date   CREATININE 1.09 12/21/2018   BUN 17 12/21/2018   NA 132 (L) 12/21/2018   K 4.6 12/21/2018   CL 97 12/21/2018   CO2 28 12/21/2018   Lab Results  Component Value Date   CHOL 158 12/21/2018   CHOL 158 09/29/2017   Lab Results  Component Value Date   HDL 47.00 12/21/2018   HDL 54.80 09/29/2017   Lab Results  Component Value Date   LDLCALC 77 12/21/2018   LDLCALC 75 09/29/2017   Lab Results  Component Value Date   TRIG 172.0 (H) 12/21/2018   TRIG 139.0 09/29/2017   Lab Results  Component Value Date   CHOLHDL 3 12/21/2018   CHOLHDL 3 09/29/2017   No results found for: LDLDIRECT The 10-year ASCVD risk score Denman George DC Jr., et al., 2013) is: 20.4%   Values used to calculate the score:  Age: [age] years     Sex: Female     Is Non-Hispanic African American: Yes     Diabetic: Yes     Tobacco smoker: No     Systolic Blood Pressure: 130 mmHg     Is BP treated: Yes     HDL Cholesterol: 47 mg/dL     Total Cholesterol: 158 mg/dL  BP Readings from Last 3 Encounters:  01/02/20 130/78  07/23/19 124/72  05/15/19 (!) 164/98   Wt Readings from Last 3 Encounters:  01/02/20 198 lb 6.4 oz (90 kg)  07/23/19 191 lb 6.4 oz (86.8 kg)  05/15/19 195 lb 9.6 oz (88.7 kg)    Health Maintenance  Topic Date Due  . MAMMOGRAM  02/14/2020  . OPHTHALMOLOGY EXAM  02/27/2020  . DEXA SCAN  04/21/2020  . HEMOGLOBIN A1C  07/02/2020   . FOOT EXAM  01/01/2021  . TETANUS/TDAP  04/13/2021  . COLONOSCOPY (Pts 45-5972yrs Insurance coverage will need to be confirmed)  02/22/2027  . INFLUENZA VACCINE  Completed  . COVID-19 Vaccine  Completed  . Hepatitis C Screening  Discontinued     Assessment  1. Annual physical exam   2. Benign essential hypertension   3. Stage 3a chronic kidney disease (HCC)   4. Combined hyperlipidemia associated with type 2 diabetes mellitus (HCC)   5. Controlled type 2 diabetes mellitus with stage 3 chronic kidney disease, without long-term current use of insulin (HCC)   6. Vitamin D deficiency   7. Age-related osteoporosis without fracture   8. Cardiac pacemaker in situ   9. History of complete heart block   10. Obesity (BMI 30-39.9)   11. Muscle cramps      Plan  Female Wellness Visit:  Age appropriate Health Maintenance and Prevention measures were discussed with patient. Included topics are cancer screening recommendations, ways to keep healthy (see AVS) including dietary and exercise recommendations, regular eye and dental care, use of seat belts, and avoidance of moderate alcohol use and tobacco use.  Screens up-to-date  BMI: discussed patient's BMI and encouraged positive lifestyle modifications to help get to or maintain a target BMI.  HM needs and immunizations were addressed and ordered. See below for orders. See HM and immunization section for updates.  Prescription for Shingrix and education given.  Patient to schedule  Routine labs and screening tests ordered including cmp, cbc and lipids where appropriate.  Discussed recommendations regarding Vit D and calcium supplementation (see AVS)  Chronic disease management visit and/or acute problem visit:  Hypertension is well controlled, check renal function electrolytes.  Chronic kidney disease monitoring  Diabetic control is worsening.  Education given.  Will work harder on diet and exercise again.  Will work on weight loss.   Continue Metformin and Jardiance.  Recheck in 3 months and titrate medications up if needed at that time.  Patient agrees.  Muscle cramps: Recommend stretching and tonic water  Neck and back pain: Likely osteoarthritis.  Trial of Tylenol Extra Strength twice daily.  If persists, will recommend x-rays  Check lipids: LDL goal less than 70.  Titrate up statin dose if not at goal yet.  Heart block stable  Follow up: Return in about 3 months (around 04/01/2020) for follow up Diabetes.  Orders Placed This Encounter  Procedures  . CBC with Differential/Platelet  . Comprehensive metabolic panel  . Lipid panel  . TSH  . VITAMIN D 25 Hydroxy (Vit-D Deficiency, Fractures)  . Microalbumin / creatinine urine ratio  . POCT HgB  A1C   Meds ordered this encounter  Medications  . Zoster Vaccine Adjuvanted [facility]) injection    Sig: Inject 0.5 mLs into the muscle once for 1 dose. Please give 2nd dose 2-6 months after first dose    Dispense:  2 each    Refill:  0      Lifestyle: Body mass index is 35.14 kg/m. Wt Readings from Last 3 Encounters:  01/02/20 198 lb 6.4 oz (90 kg)  07/23/19 191 lb 6.4 oz (86.8 kg)  05/15/19 195 lb 9.6 oz (88.7 kg)     Patient Active Problem List   Diagnosis Date Noted  . Cardiac pacemaker in situ 03/19/2019    Added automatically from request for surgery [id number]   . Encounter for screening colonoscopy 02/21/2017    2/19 colon - no polyps Recommend screening exam in 2/29 - [facility]   . CKD (chronic kidney disease) stage 3, GFR 30-59 ml/min (HCC) 10/04/2016  . Obesity (BMI 30-39.9) 01/12/2016  . Primary localized osteoarthrosis of left lower leg 07/16/2014  . History of complete heart block 10/04/2012    Overview:  Dr. Mayme Genta, S/p pacemaker, stable 2014/cla No ischemia by stress testing 2011   . Combined hyperlipidemia associated with type 2 diabetes mellitus (HCC) 11/04/2011  . Controlled type 2 diabetes mellitus with stage 3 chronic kidney  disease, without long-term current use of insulin (HCC) 11/04/2011  . Age-related osteoporosis without fracture 06/01/2011    T = -2.6 04/2017 in L hip. T = -1.2 05/2011; recheck at age 65.   . Vitamin D deficiency 04/16/2011  . Benign essential hypertension 01/11/2011   Health Maintenance  Topic Date Due  . MAMMOGRAM  02/14/2020  . OPHTHALMOLOGY EXAM  02/27/2020  . DEXA SCAN  04/21/2020  . HEMOGLOBIN A1C  07/02/2020  . FOOT EXAM  01/01/2021  . TETANUS/TDAP  04/13/2021  . COLONOSCOPY (Pts 45-57yrs Insurance coverage will need to be confirmed)  02/22/2027  . INFLUENZA VACCINE  Completed  . COVID-19 Vaccine  Completed  . Hepatitis C Screening  Discontinued   Immunization History  Administered Date(s) Administered  . Fluad Quad(high Dose 65+) 09/08/2018, 11/12/2019  . PFIZER SARS-COV-2 Vaccination 02/11/2019, 03/04/2019, 10/15/2019  . Tdap 04/14/2011  . Zoster 04/14/2011   We updated and reviewed the patient's past history in detail and it is documented below. Allergies: Patient has No Known Allergies. Past Medical History Patient  has a past medical history of Arthritis, Chicken pox, Diabetes mellitus (HCC), Hyperlipidemia, Hypertension, and Osteopenia. Past Surgical History Patient  has a past surgical history that includes Reduction mammaplasty (Bilateral, 2009); Cardiac pacemaker placement; Tubal ligation; and Cesarean section (1974, 1977, 1979). Family History: Patient family history includes Arthritis in her father; Diabetes in her brother; Glaucoma in her mother; Healthy in her daughter and daughter; Heart disease in her mother; Hypertension in her mother; Osteoporosis in her mother; Pneumonia in her father; Stroke in her brother. Social History:  Patient  reports that she has never smoked. She has never used smokeless tobacco. She reports that she does not drink alcohol and does not use drugs.  Review of Systems: Constitutional: negative for fever or malaise Ophthalmic:  negative for photophobia, double vision or loss of vision Cardiovascular: negative for chest pain, dyspnea on exertion, or new LE swelling Respiratory: negative for SOB or persistent cough Gastrointestinal: negative for abdominal pain, change in bowel habits or melena Genitourinary: negative for dysuria or gross hematuria, no abnormal uterine bleeding or disharge Musculoskeletal: negative for new gait disturbance or muscular  weakness Integumentary: negative for new or persistent rashes, no breast lumps Neurological: negative for TIA or stroke symptoms Psychiatric: negative for SI or delusions Allergic/Immunologic: negative for hives  Patient Care Team    Relationship Specialty Notifications Start End  Willow Ora, MD PCP - General Family Medicine  06/28/17   Ernesto Rutherford, MD Consulting Physician Ophthalmology  06/28/17   Samella Parr, MD Referring Physician Nephrology  06/28/17   Rulon Sera, MD Referring Physician Cardiology  06/28/17   Dr. Albin Felling  Dentistry  06/28/17     Objective  Vitals: BP 130/78   Pulse 85   Temp (!) 97.3 F (36.3 C) (Temporal)   Resp 18   Ht 5\' 3"  (1.6 m)   Wt 198 lb 6.4 oz (90 kg)   SpO2 98%   BMI 35.14 kg/m  General:  Well developed, well nourished, no acute distress  Psych:  Alert and orientedx3,normal mood and affect HEENT:  Normocephalic, atraumatic, non-icteric sclera,  supple neck without adenopathy, mass or thyromegaly Cardiovascular:  Normal S1, S2, RRR without gallop, rub or murmur Respiratory:  Good breath sounds bilaterally, CTAB with normal respiratory effort Gastrointestinal: normal bowel sounds, soft, non-tender, no noted masses. No HSM MSK: no deformities, contusions. Joints are without erythema or swelling.  Skin:  Warm, no rashes or suspicious lesions noted Neurologic:    Mental status is normal. CN 2-11 are normal. Gross motor and sensory exams are normal. Normal gait. No tremor Breast Exam: Patient deferred   Commons  side effects, risks, benefits, and alternatives for medications and treatment plan prescribed today were discussed, and the patient expressed understanding of the given instructions. Patient is instructed to call or message via MyChart if he/she has any questions or concerns regarding our treatment plan. No barriers to understanding were identified. We discussed Red Flag symptoms and signs in detail. Patient expressed understanding regarding what to do in case of urgent or emergency type symptoms.   Medication list was reconciled, printed and provided to the patient in AVS. Patient instructions and summary information was reviewed with the patient as documented in the AVS. This note was prepared with assistance of Dragon voice recognition software. Occasional wrong-word or sound-a-like substitutions may have occurred due to the inherent limitations of voice recognition software  This visit occurred during the SARS-CoV-2 public health emergency.  Safety protocols were in place, including screening questions prior to the visit, additional usage of staff PPE, and extensive cleaning of exam room while observing appropriate contact time as indicated for disinfecting solutions.

## 2020-01-02 NOTE — Patient Instructions (Addendum)
Please return in 3 months for diabetes follow up  Work on improving your diet again.  Add tylenol ES twice a day for your leg and back pain.  Continue all of your medications.  Please take the prescription for Shingrix to the pharmacy so they may administer the vaccinations. Your insurance will then cover the injections.   If you have any questions or concerns, please don't hesitate to send me a message via MyChart or call the office at [phone]. Thank you for visiting with [place] today! It's our pleasure caring for you.   Diabetes Mellitus and Nutrition, Adult When you have diabetes (diabetes mellitus), it is very important to have healthy eating habits because your blood sugar (glucose) levels are greatly affected by what you eat and drink. Eating healthy foods in the appropriate amounts, at about the same times every day, can help you:  Control your blood glucose.  Lower your risk of heart disease.  Improve your blood pressure.  Reach or maintain a healthy weight. Every person with diabetes is different, and each person has different needs for a meal plan. Your health care provider may recommend that you work with a diet and nutrition specialist (dietitian) to make a meal plan that is best for you. Your meal plan may vary depending on factors such as:  The calories you need.  The medicines you take.  Your weight.  Your blood glucose, blood pressure, and cholesterol levels.  Your activity level.  Other health conditions you have, such as heart or kidney disease. How do carbohydrates affect me? Carbohydrates, also called carbs, affect your blood glucose level more than any other type of food. Eating carbs naturally raises the amount of glucose in your blood. Carb counting is a method for keeping track of how many carbs you eat. Counting carbs is important to keep your blood glucose at a healthy level, especially if you use insulin or take certain oral diabetes medicines. It is  important to know how many carbs you can safely have in each meal. This is different for every person. Your dietitian can help you calculate how many carbs you should have at each meal and for each snack. Foods that contain carbs include:  Bread, cereal, rice, pasta, and crackers.  Potatoes and corn.  Peas, beans, and lentils.  Milk and yogurt.  Fruit and juice.  Desserts, such as cakes, cookies, ice cream, and candy. How does alcohol affect me? Alcohol can cause a sudden decrease in blood glucose (hypoglycemia), especially if you use insulin or take certain oral diabetes medicines. Hypoglycemia can be a life-threatening condition. Symptoms of hypoglycemia (sleepiness, dizziness, and confusion) are similar to symptoms of having too much alcohol. If your health care provider says that alcohol is safe for you, follow these guidelines:  Limit alcohol intake to no more than 1 drink per day for nonpregnant women and 2 drinks per day for men. One drink equals 12 oz of beer, 5 oz of wine, or 1 oz of hard liquor.  Do not drink on an empty stomach.  Keep yourself hydrated with water, diet soda, or unsweetened iced tea.  Keep in mind that regular soda, juice, and other mixers may contain a lot of sugar and must be counted as carbs. What are tips for following this plan?  Reading food labels  Start by checking the serving size on the "Nutrition Facts" label of packaged foods and drinks. The amount of calories, carbs, fats, and other nutrients listed on the label  is based on one serving of the item. Many items contain more than one serving per package.  Check the total grams (g) of carbs in one serving. You can calculate the number of servings of carbs in one serving by dividing the total carbs by 15. For example, if a food has 30 g of total carbs, it would be equal to 2 servings of carbs.  Check the number of grams (g) of saturated and trans fats in one serving. Choose foods that have low or  no amount of these fats.  Check the number of milligrams (mg) of salt (sodium) in one serving. Most people should limit total sodium intake to less than 2,300 mg per day.  Always check the nutrition information of foods labeled as "low-fat" or "nonfat". These foods may be higher in added sugar or refined carbs and should be avoided.  Talk to your dietitian to identify your daily goals for nutrients listed on the label. Shopping  Avoid buying canned, premade, or processed foods. These foods tend to be high in fat, sodium, and added sugar.  Shop around the outside edge of the grocery store. This includes fresh fruits and vegetables, bulk grains, fresh meats, and fresh dairy. Cooking  Use low-heat cooking methods, such as baking, instead of high-heat cooking methods like deep frying.  Cook using healthy oils, such as olive, canola, or sunflower oil.  Avoid cooking with butter, cream, or high-fat meats. Meal planning  Eat meals and snacks regularly, preferably at the same times every day. Avoid going long periods of time without eating.  Eat foods high in fiber, such as fresh fruits, vegetables, beans, and whole grains. Talk to your dietitian about how many servings of carbs you can eat at each meal.  Eat 4-6 ounces (oz) of lean protein each day, such as lean meat, chicken, fish, eggs, or tofu. One oz of lean protein is equal to: ? 1 oz of meat, chicken, or fish. ? 1 egg. ?  cup of tofu.  Eat some foods each day that contain healthy fats, such as avocado, nuts, seeds, and fish. Lifestyle  Check your blood glucose regularly.  Exercise regularly as told by your health care provider. This may include: ? 150 minutes of moderate-intensity or vigorous-intensity exercise each week. This could be brisk walking, biking, or water aerobics. ? Stretching and doing strength exercises, such as yoga or weightlifting, at least 2 times a week.  Take medicines as told by your health care  provider.  Do not use any products that contain nicotine or tobacco, such as cigarettes and e-cigarettes. If you need help quitting, ask your health care provider.  Work with a Social worker or diabetes educator to identify strategies to manage stress and any emotional and social challenges. Questions to ask a health care provider  Do I need to meet with a diabetes educator?  Do I need to meet with a dietitian?  What number can I call if I have questions?  When are the best times to check my blood glucose? Where to find more information:  American Diabetes Association: diabetes.org  Academy of Nutrition and Dietetics: www.eatright.[employer] of Diabetes and Digestive and Kidney Diseases (NIH): [employer] Summary  A healthy meal plan will help you control your blood glucose and maintain a healthy lifestyle.  Working with a diet and nutrition specialist (dietitian) can help you make a meal plan that is best for you.  Keep in mind that carbohydrates (carbs) and alcohol have immediate  effects on your blood glucose levels. It is important to count carbs and to use alcohol carefully. This information is not intended to replace advice given to you by your health care provider. Make sure you discuss any questions you have with your health care provider. Document Revised: 12/03/2016 Document Reviewed: 01/26/2016 Elsevier Patient Education  2020 Elsevier Inc.   Leg Cramps Leg cramps occur when one or more muscles tighten and you have no control over this tightening (involuntary muscle contraction). Muscle cramps can develop in any muscle, but the most common place is in the calf muscles of the leg. Those cramps can occur during exercise or when you are at rest. Leg cramps are painful, and they may last for a few seconds to a few minutes. Cramps may return several times before they finally stop. Usually, leg cramps are not caused by a serious medical problem. In many cases,  the cause is not known. Some common causes include:  Excessive physical effort (overexertion), such as during intense exercise.  Overuse from repetitive motions, or doing the same thing over and over.  Staying in a certain position for a long period of time.  Improper preparation, form, or technique while performing a sport or an activity.  Dehydration.  Injury.  Side effects of certain medicines.  Abnormally low levels of minerals in your blood (electrolytes), especially potassium and calcium. This could result from: ? Pregnancy. ? Taking diuretic medicines. Follow these instructions at home: Eating and drinking  Drink enough fluid to keep your urine pale yellow. Staying hydrated may help prevent cramps.  Eat a healthy diet that includes plenty of nutrients to help your muscles function. A healthy diet includes fruits and vegetables, lean protein, whole grains, and low-fat or nonfat dairy products. Managing pain, stiffness, and swelling      Try massaging, stretching, and relaxing the affected muscle. Do this for several minutes at a time.  If directed, put ice on areas that are sore or painful after a cramp: ? Put ice in a plastic bag. ? Place a towel between your skin and the bag. ? Leave the ice on for 20 minutes, 2-3 times a day.  If directed, apply heat to muscles that are tense or tight. Do this before you exercise, or as often as told by your health care provider. Use the heat source that your health care provider recommends, such as a moist heat pack or a heating pad. ? Place a towel between your skin and the heat source. ? Leave the heat on for 20-30 minutes. ? Remove the heat if your skin turns bright red. This is especially important if you are unable to feel pain, heat, or cold. You may have a greater risk of getting burned.  Try taking hot showers or baths to help relax tight muscles. General instructions  If you are having frequent leg cramps, avoid intense  exercise for several days.  Take over-the-counter and prescription medicines only as told by your health care provider.  Keep all follow-up visits as told by your health care provider. This is important. Contact a health care provider if:  Your leg cramps get more severe or more frequent, or they do not improve over time.  Your foot becomes cold, numb, or blue. Summary  Muscle cramps can develop in any muscle, but the most common place is in the calf muscles of the leg.  Leg cramps are painful, and they may last for a few seconds to a few minutes.  Usually, leg cramps are not caused by a serious medical problem. Often, the cause is not known.  Stay hydrated and take over-the-counter and prescription medicines only as told by your health care provider. This information is not intended to replace advice given to you by your health care provider. Make sure you discuss any questions you have with your health care provider. Document Revised: 12/03/2016 Document Reviewed: 09/30/2016 Elsevier Patient Education  2020 [employer].

## 2020-01-10 ENCOUNTER — Other Ambulatory Visit: Payer: Self-pay

## 2020-01-10 MED ORDER — ATORVASTATIN CALCIUM 40 MG PO TABS: 40.0000 mg | ORAL_TABLET | Freq: Every day | ORAL | 3 refills | Status: DC

## 2020-01-28 ENCOUNTER — Other Ambulatory Visit: Payer: Self-pay | Admitting: Family Medicine

## 2020-01-28 DIAGNOSIS — Z1231 Encounter for screening mammogram for malignant neoplasm of breast: Secondary | ICD-10-CM

## 2020-02-05 DIAGNOSIS — I442 Atrioventricular block, complete: Secondary | ICD-10-CM | POA: Diagnosis not present

## 2020-02-23 ENCOUNTER — Ambulatory Visit
Admission: RE | Admit: 2020-02-23 | Discharge: 2020-02-23 | Disposition: A | Discharge status: Home / Self Care | Payer: TRICARE For Life (TFL) | Source: Ambulatory Visit | Attending: Family Medicine | Admitting: Family Medicine

## 2020-02-23 ENCOUNTER — Other Ambulatory Visit: Payer: Self-pay

## 2020-02-23 DIAGNOSIS — Z1231 Encounter for screening mammogram for malignant neoplasm of breast: Secondary | ICD-10-CM

## 2020-02-28 ENCOUNTER — Encounter: Payer: Self-pay | Admitting: Family Medicine

## 2020-02-28 DIAGNOSIS — H2513 Age-related nuclear cataract, bilateral: Secondary | ICD-10-CM | POA: Diagnosis not present

## 2020-02-28 DIAGNOSIS — E119 Type 2 diabetes mellitus without complications: Secondary | ICD-10-CM | POA: Diagnosis not present

## 2020-02-28 LAB — HM DIABETES EYE EXAM

## 2020-03-10 ENCOUNTER — Ambulatory Visit: Payer: TRICARE For Life (TFL)

## 2020-03-21 ENCOUNTER — Telehealth: Payer: Self-pay

## 2020-03-21 NOTE — Telephone Encounter (Signed)
Spoke with patient. Answered all questions.

## 2020-03-21 NOTE — Telephone Encounter (Signed)
Pt called regarding a shingles prescription that Dr. Mardelle Matte wrote for her ? Please advise.

## 2020-04-07 ENCOUNTER — Other Ambulatory Visit: Payer: Self-pay

## 2020-04-07 ENCOUNTER — Encounter: Payer: Self-pay | Admitting: Family Medicine

## 2020-04-07 ENCOUNTER — Other Ambulatory Visit: Payer: Self-pay | Admitting: Family Medicine

## 2020-04-07 ENCOUNTER — Ambulatory Visit (INDEPENDENT_AMBULATORY_CARE_PROVIDER_SITE_OTHER): Payer: Medicare PPO | Admitting: Family Medicine

## 2020-04-07 ENCOUNTER — Ambulatory Visit (INDEPENDENT_AMBULATORY_CARE_PROVIDER_SITE_OTHER): Payer: Medicare PPO

## 2020-04-07 VITALS — BP 122/78 | HR 86 | Temp 97.8°F | Resp 16 | Ht 63.0 in | Wt 197.8 lb

## 2020-04-07 DIAGNOSIS — M778 Other enthesopathies, not elsewhere classified: Secondary | ICD-10-CM | POA: Diagnosis not present

## 2020-04-07 DIAGNOSIS — M818 Other osteoporosis without current pathological fracture: Secondary | ICD-10-CM

## 2020-04-07 DIAGNOSIS — E1122 Type 2 diabetes mellitus with diabetic chronic kidney disease: Secondary | ICD-10-CM

## 2020-04-07 DIAGNOSIS — G8929 Other chronic pain: Secondary | ICD-10-CM

## 2020-04-07 DIAGNOSIS — Z7185 Encounter for immunization safety counseling: Secondary | ICD-10-CM | POA: Diagnosis not present

## 2020-04-07 DIAGNOSIS — R151 Fecal smearing: Secondary | ICD-10-CM | POA: Diagnosis not present

## 2020-04-07 DIAGNOSIS — Z6835 Body mass index (BMI) 35.0-35.9, adult: Secondary | ICD-10-CM

## 2020-04-07 DIAGNOSIS — I10 Essential (primary) hypertension: Secondary | ICD-10-CM | POA: Diagnosis not present

## 2020-04-07 DIAGNOSIS — M545 Low back pain, unspecified: Secondary | ICD-10-CM | POA: Diagnosis not present

## 2020-04-07 DIAGNOSIS — M5441 Lumbago with sciatica, right side: Secondary | ICD-10-CM

## 2020-04-07 DIAGNOSIS — M549 Dorsalgia, unspecified: Secondary | ICD-10-CM | POA: Diagnosis not present

## 2020-04-07 DIAGNOSIS — M79605 Pain in left leg: Secondary | ICD-10-CM | POA: Diagnosis not present

## 2020-04-07 DIAGNOSIS — N183 Chronic kidney disease, stage 3 unspecified: Secondary | ICD-10-CM

## 2020-04-07 LAB — POCT GLYCOSYLATED HEMOGLOBIN (HGB A1C): Hemoglobin A1C: 8.5 % — AB (ref 4.0–5.6)

## 2020-04-07 MED ORDER — METFORMIN HCL ER 750 MG PO TB24: 750.0000 mg | ORAL_TABLET | Freq: Every day | ORAL | 3 refills | Status: DC

## 2020-04-07 MED ORDER — TRULICITY 0.75 MG/0.5ML ~~LOC~~ SOAJ: 0.7500 mg | SUBCUTANEOUS | 11 refills | Status: DC

## 2020-04-07 MED ORDER — SHINGRIX 50 MCG/0.5ML IM SUSR: 0.5000 mL | Freq: Once | INTRAMUSCULAR | 0 refills | Status: AC

## 2020-04-07 NOTE — Progress Notes (Signed)
Subjective  CC:  Chief Complaint  Patient presents with  . Diabetes  . Leg Pain    Right leg, on-going issue. Will have to stop while walking sometimes because the pain is so bad  . loose bowel movements    Sometimes unable to control bowels, will have accidents on herself. Has been going on for the past several months. Has seen blood in the past during these episodes   . Health Maintenance    Agreeable to Shingrix vaccine prescription     HPI: Amy Bartlett is a 70 y.o. female who presents to the office today for follow up of diabetes and problems listed above in the chief complaint.   Diabetes follow up: This is 4017-month follow-up for worsening diabetic control.  Last A1c was 7.7.  She reports her diet is unchanged although she admits to eating a lot of fruit daily.  She likes grapes, apples, bananas, oranges etc.  Sometimes she will skip meals.  She has been to diabetic nutrition counseling in the past.  She feels like she knows what she should. Her diabetic control is reported as Unchanged.  She admits to occasional blurred vision but denies polyuria, polydipsia or weight loss. She denies exertional CP or SOB or symptomatic hypoglycemia. She denies foot sores or paresthesias.  She takes Metformin XR 1500 mg daily.  She complains of mild fecal incontinence: She describes a sensation of leakage of stool.  When she go to the bathroom there will be smears of [place] stool on her undergarments.  She denies abdominal pain, cramping, loose stools, diarrhea, mucus or blood in the stool or gross fecal incontinence.  She is on high-dose Metformin.  She had a normal colonoscopy in the recent past.  No fevers or chills.  Symptoms do not seem to be related to food.  Started about 2 months ago.  Happens intermittently.  Low back and right leg pain.  This was first noticed about a year ago.  She was treated with anti-inflammatory which helped only mildly.  Now complains of persistent mild dull  aching but worse with walking.  At times she will have right leg pain and back pain without urinary incontinence, numbness or weakness in the leg.  She tends not to take medications for it.  She denies back or leg pain at night, no hip pain at night.  Osteoporosis: Due for bone density scan.  We started Fosamax last year however she stopped taking it inadvertently.  She denies adverse effects to it.  She is taking calcium and vitamin D.  Health maintenance: Eligible for Shingrix vaccinations. Wt Readings from Last 3 Encounters:  04/07/20 197 lb 12.8 oz (89.7 kg)  01/02/20 198 lb 6.4 oz (90 kg)  07/23/19 191 lb 6.4 oz (86.8 kg)    BP Readings from Last 3 Encounters:  04/07/20 122/78  01/02/20 130/78  07/23/19 124/72    Assessment  1. Controlled type 2 diabetes mellitus with stage 3 chronic kidney disease, without long-term current use of insulin (HCC)   2. Benign essential hypertension   3. Age-related osteoporosis without fracture   4. Chronic midline low back pain with right-sided sciatica   5. Fecal smearing   6. Vaccine counseling   7. Class 2 severe obesity due to excess calories with serious comorbidity and body mass index (BMI) of 35.0 to 35.9 in adult [facility](HCC)      Plan   Diabetes is currently poorly controlled.  Education given.  Progressive disease.  Offered  nutrition counseling, she defers for now.  Discussed decreasing foods in the diet.  Increasing grains, fibers and proteins.  Due to GI symptoms, see below, decrease Metformin XR to 750 mg daily.  And Trulicity 0.75 mg weekly titrate up if tolerated.  Continue Jardiance 25 mg daily.  Recheck 3 months.  Hypertension is well controlled.  Continue current medications.  Osteoporosis: Counseling done.  Recheck bone density and reinstitute Fosamax if indicated.  Continue calcium vitamin D and weightbearing activity.  Low back pain with radicular symptoms: Check x-rays.  Depending on results, offered physical therapy versus  orthopedics.  Patient agrees.  Fecal smearing: Could be secondary to high-dose Metformin.  Decrease dose of Metformin and monitor.  If persist, needs follow-up with GI.  Current symptomatology does not indicate colitis, infectious or inflammatory or food sensitivity.  Will follow  Vaccine counseling: Discussed Shingrix vaccination.  Prescription given and patient to get at pharmacy.  Obesity: Discussed diet changes and recommend weight loss.  Follow up: No follow-ups on file.. Orders Placed This Encounter  Procedures  . DG Bone Density  . DG Lumbar Spine Complete  . DG HIPS BILAT W OR W/O PELVIS 2V  . POCT HgB A1C   Meds ordered this encounter  Medications  . Zoster Vaccine Adjuvanted [facility]) injection    Sig: Inject 0.5 mLs into the muscle once for 1 dose. Please give 2nd dose 2-6 months after first dose    Dispense:  2 each    Refill:  0  . TRULICITY 0.75 MG/0.5ML SOPN    Sig: Inject 0.75 mg into the skin once a week.    Dispense:  2 mL    Refill:  11  . metFORMIN (GLUCOPHAGE-XR) 750 MG 24 hr tablet    Sig: Take 1 tablet (750 mg total) by mouth daily with breakfast.    Dispense:  180 tablet    Refill:  3      Immunization History  Administered Date(s) Administered  . Fluad Quad(high Dose 65+) 09/08/2018, 11/12/2019  . PFIZER(Purple Top)SARS-COV-2 Vaccination 02/11/2019, 03/04/2019, 10/15/2019  . Tdap 04/14/2011  . Zoster 04/14/2011    Diabetes Related Lab Review: Lab Results  Component Value Date   HGBA1C 8.5 (A) 04/07/2020   HGBA1C 7.7 (A) 01/02/2020   HGBA1C 6.9 (A) 07/23/2019    Lab Results  Component Value Date   MICROALBUR <0.7 01/02/2020   Lab Results  Component Value Date   CREATININE 1.52 (H) 01/02/2020   BUN 30 (H) 01/02/2020   NA 132 (L) 01/02/2020   K 3.8 01/02/2020   CL 96 01/02/2020   CO2 29 01/02/2020   Lab Results  Component Value Date   CHOL 219 (H) 01/02/2020   CHOL 158 12/21/2018   CHOL 158 09/29/2017   Lab Results  Component  Value Date   HDL 55.70 01/02/2020   HDL 47.00 12/21/2018   HDL 54.80 09/29/2017   Lab Results  Component Value Date   LDLCALC 77 12/21/2018   LDLCALC 75 09/29/2017   Lab Results  Component Value Date   TRIG 259.0 (H) 01/02/2020   TRIG 172.0 (H) 12/21/2018   TRIG 139.0 09/29/2017   Lab Results  Component Value Date   CHOLHDL 4 01/02/2020   CHOLHDL 3 12/21/2018   CHOLHDL 3 09/29/2017   Lab Results  Component Value Date   LDLDIRECT 134.0 01/02/2020   The 10-year ASCVD risk score Denman George DC Jr., et al., 2013) is: 24.8%   Values used to calculate the score:  Age: 73 years     Sex: Female     Is Non-Hispanic African American: Yes     Diabetic: Yes     Tobacco smoker: No     Systolic Blood Pressure: 122 mmHg     Is BP treated: Yes     HDL Cholesterol: 55.7 mg/dL     Total Cholesterol: 219 mg/dL I have reviewed the PMH, Fam and Soc history. Patient Active Problem List   Diagnosis Date Noted  . Cardiac pacemaker in situ 03/19/2019    Priority: High    Added automatically from request for surgery [id number]   . CKD (chronic kidney disease) stage 3, GFR 30-59 ml/min (HCC) 10/04/2016    Priority: High  . History of complete heart block 10/04/2012    Priority: High    Overview:  Dr. Mayme Genta, S/p pacemaker, stable 2014/cla No ischemia by stress testing 2011   . Combined hyperlipidemia associated with type 2 diabetes mellitus (HCC) 11/04/2011    Priority: High  . Controlled type 2 diabetes mellitus with stage 3 chronic kidney disease, without long-term current use of insulin (HCC) 11/04/2011    Priority: High  . Benign essential hypertension 01/11/2011    Priority: High  . Obesity (BMI 30-39.9) 01/12/2016    Priority: Medium  . Primary localized osteoarthrosis of left lower leg 07/16/2014    Priority: Medium  . Age-related osteoporosis without fracture 06/01/2011    Priority: Medium    T = -2.6 04/2017 in L hip. T = -1.2 05/2011; recheck at age 89.   . Encounter for  screening colonoscopy 02/21/2017    Priority: Low    2/19 colon - no polyps Recommend screening exam in 2/29 - [facility]   . Vitamin D deficiency 04/16/2011    Priority: Low    Social History: Patient  reports that she has never smoked. She has never used smokeless tobacco. She reports that she does not drink alcohol and does not use drugs.  Review of Systems: Ophthalmic: negative for eye pain, loss of vision or double vision Cardiovascular: negative for chest pain Respiratory: negative for SOB or persistent cough Gastrointestinal: negative for abdominal pain Genitourinary: negative for dysuria or gross hematuria MSK: negative for foot lesions Neurologic: negative for weakness or gait disturbance  Objective  Vitals: BP 122/78   Pulse 86   Temp 97.8 F (36.6 C) (Temporal)   Resp 16   Ht 5\' 3"  (1.6 m)   Wt 197 lb 12.8 oz (89.7 kg)   SpO2 98%   BMI 35.04 kg/m  General: well appearing, no acute distress  Psych:  Alert and oriented, normal mood and affect HEENT:  Normocephalic, atraumatic, moist mucous membranes, supple neck  Cardiovascular:  Nl S1 and S2, RRR without murmur, gallop or rub. no edema Respiratory:  Good breath sounds bilaterally, CTAB with normal effort, no rales Gastrointestinal: normal BS, soft, nontender, no masses, no hepatosplenomegaly Skin:  Warm, no rashes Neurologic:   Mental status is normal. normal gait Musculoskeletal: Full range of motion back, negative straight legs bilaterally, negative hip tenderness, full range of motion of hips  Today's visit was 45 minutes long. Greater than 50% of this time was devoted to face to face counseling with the patient and coordination of care. We discussed her diagnosis, prognosis, treatment options and treatment plan is documented above. '     Diabetic education: ongoing education regarding chronic disease management for diabetes was given today. We continue to reinforce the ABC's of diabetic management: A1c (<7  or 8 dependent upon patient), tight blood pressure control, and cholesterol management with goal LDL < 100 minimally. We discuss diet strategies, exercise recommendations, medication options and possible side effects. At each visit, we review recommended immunizations and preventive care recommendations for diabetics and stress that good diabetic control can prevent other problems. See below for this patient's data.    Commons side effects, risks, benefits, and alternatives for medications and treatment plan prescribed today were discussed, and the patient expressed understanding of the given instructions. Patient is instructed to call or message via MyChart if he/she has any questions or concerns regarding our treatment plan. No barriers to understanding were identified. We discussed Red Flag symptoms and signs in detail. Patient expressed understanding regarding what to do in case of urgent or emergency type symptoms.   Medication list was reconciled, printed and provided to the patient in AVS. Patient instructions and summary information was reviewed with the patient as documented in the AVS. This note was prepared with assistance of Dragon voice recognition software. Occasional wrong-word or sound-a-like substitutions may have occurred due to the inherent limitations of voice recognition software  This visit occurred during the SARS-CoV-2 public health emergency.  Safety protocols were in place, including screening questions prior to the visit, additional usage of staff PPE, and extensive cleaning of exam room while observing appropriate contact time as indicated for disinfecting solutions.

## 2020-04-07 NOTE — Patient Instructions (Signed)
Please return in 3 months for diabetes follow up I will let you know next steps for your back pain.   Please decrease your metformin to 750mg  daily and start the trulicity injections weekly. We can increase to 1.5mg  weekly in the next month if you are tolerating it. Please let me know.   I have ordered a mammogram and/or bone density for you as we discussed today: []   Mammogram  [x]   Bone Density  Please call the office checked below to schedule your appointment:  [x]   [facility]      [address] [place],                []   [facility]  [address] [place], [phone]   Please take the prescription for Shingrix to the pharmacy so they may administer the vaccinations. Your insurance will then cover the injections.   If you have any questions or concerns, please don't hesitate to send me a message via MyChart or call the office at [phone]. Thank you for visiting with [address] today! It's our pleasure caring for you.

## 2020-04-08 ENCOUNTER — Other Ambulatory Visit: Payer: Self-pay

## 2020-04-08 DIAGNOSIS — M1990 Unspecified osteoarthritis, unspecified site: Secondary | ICD-10-CM

## 2020-04-21 ENCOUNTER — Telehealth: Payer: Self-pay

## 2020-04-21 NOTE — Telephone Encounter (Signed)
Called pt to schedule PT appt. Pt states she is confused about her x-ray results. Pt states the x-rays were taken on her left side but it is her right side that is hurting. Please advise.

## 2020-04-22 NOTE — Telephone Encounter (Signed)
Clarified with patient that Dr. Mardelle Matte said hips looked good. Again advised to scheduled PT for lower back arthritic changes. Patient states she will call back

## 2020-05-07 ENCOUNTER — Other Ambulatory Visit: Payer: Self-pay | Admitting: Family Medicine

## 2020-06-09 ENCOUNTER — Other Ambulatory Visit: Payer: Self-pay | Admitting: Family Medicine

## 2020-06-26 ENCOUNTER — Other Ambulatory Visit: Payer: Self-pay | Admitting: Family Medicine

## 2020-07-09 DIAGNOSIS — I442 Atrioventricular block, complete: Secondary | ICD-10-CM | POA: Diagnosis not present

## 2020-07-10 ENCOUNTER — Ambulatory Visit: Payer: TRICARE For Life (TFL) | Admitting: Family Medicine

## 2020-07-14 ENCOUNTER — Encounter: Payer: Self-pay | Admitting: Family Medicine

## 2020-07-14 ENCOUNTER — Other Ambulatory Visit: Payer: Self-pay

## 2020-07-14 ENCOUNTER — Other Ambulatory Visit: Payer: Self-pay | Admitting: Physician Assistant

## 2020-07-14 ENCOUNTER — Ambulatory Visit (INDEPENDENT_AMBULATORY_CARE_PROVIDER_SITE_OTHER): Payer: Medicare PPO | Admitting: Family Medicine

## 2020-07-14 VITALS — BP 116/80 | HR 93 | Temp 98.2°F | Wt 192.4 lb

## 2020-07-14 DIAGNOSIS — M47816 Spondylosis without myelopathy or radiculopathy, lumbar region: Secondary | ICD-10-CM | POA: Diagnosis not present

## 2020-07-14 DIAGNOSIS — E1169 Type 2 diabetes mellitus with other specified complication: Secondary | ICD-10-CM

## 2020-07-14 DIAGNOSIS — N1831 Chronic kidney disease, stage 3a: Secondary | ICD-10-CM | POA: Diagnosis not present

## 2020-07-14 DIAGNOSIS — R151 Fecal smearing: Secondary | ICD-10-CM

## 2020-07-14 DIAGNOSIS — I10 Essential (primary) hypertension: Secondary | ICD-10-CM

## 2020-07-14 DIAGNOSIS — E782 Mixed hyperlipidemia: Secondary | ICD-10-CM | POA: Diagnosis not present

## 2020-07-14 DIAGNOSIS — E1122 Type 2 diabetes mellitus with diabetic chronic kidney disease: Secondary | ICD-10-CM

## 2020-07-14 DIAGNOSIS — N183 Chronic kidney disease, stage 3 unspecified: Secondary | ICD-10-CM

## 2020-07-14 LAB — POCT GLYCOSYLATED HEMOGLOBIN (HGB A1C): Hemoglobin A1C: 6.5 % — AB (ref 4.0–5.6)

## 2020-07-14 MED ORDER — LISINOPRIL-HYDROCHLOROTHIAZIDE 20-25 MG PO TABS: 1.0000 | ORAL_TABLET | Freq: Every day | ORAL | 3 refills | Status: DC

## 2020-07-14 NOTE — Patient Instructions (Signed)
Please return in 3 months for diabetes follow up  I will release your lab results to you on your MyChart account with further instructions. Please reply with any questions.   If you have any questions or concerns, please don't hesitate to send me a message via MyChart or call the office at [phone]. Thank you for visiting with us today! It's our pleasure caring for you.   

## 2020-07-14 NOTE — Progress Notes (Signed)
Subjective  CC:  Chief Complaint  Patient presents with   Diabetes    HPI: Amy Bartlett is a 70 y.o. female who presents to the office today for follow up of diabetes and problems listed above in the chief complaint.  Diabetes follow up: Her diabetic control is reported as Improved.  We added Trulicity to her Jardiance and metformin, we decreased her metformin due to GI sxs. She had some nausea when she started Trulicity but is tolerating it well now.  She has lost a few pounds.      She denies exertional CP or SOB or symptomatic hypoglycemia. She denies foot sores or paresthesias.  Chronic low back pain due to arthritis remains active without radicular symptoms now.  She was unable to go to physical therapy due to having to travel to [place] to help take care of her sister who has cervical cancer.  She returned home last week.  She would be open to physical therapy now.  No new symptoms. Fecal smearing: Minimal now.  On lower dose metformin.  Will follow. Hyperlipidemia: We increased dose of statin in December.  She is tolerating this well.  Due for recheck.  Goal LDL is less than 70. Chronic kidney disease due for recheck.  No lower extremity edema Hypertension remains well controlled.  No chest pain or shortness of breath Wt Readings from Last 3 Encounters:  07/14/20 192 lb 6.4 oz (87.3 kg)  04/07/20 197 lb 12.8 oz (89.7 kg)  01/02/20 198 lb 6.4 oz (90 kg)    BP Readings from Last 3 Encounters:  07/14/20 116/80  04/07/20 122/78  01/02/20 130/78    Assessment  1. Controlled type 2 diabetes mellitus with stage 3 chronic kidney disease, without long-term current use of insulin (HCC)   2. Spondylosis of lumbar region without myelopathy or radiculopathy   3. Fecal smearing   4. Combined hyperlipidemia associated with type 2 diabetes mellitus (HCC)   5. Stage 3a chronic kidney disease (HCC)   6. Benign essential hypertension      Plan  Diabetes is currently very  well controlled.  She is doing well now on Trulicity, SGLT2 inhibitor and metformin XR 750 daily.  Continue diabetic diet. Hypertension hyperlipidemia are well controlled.  Recheck lipids today.  Nonfasting. Chronic kidney disease: Monitor GFR.  Remains asymptomatic Low back pain due to osteoarthritis: Refer for physical therapy.  Education given.  Follow up: 3 months to recheck diabetes. Orders Placed This Encounter  Procedures   Comprehensive metabolic panel   Lipid panel   POCT HgB A1C   No orders of the defined types were placed in this encounter.     Immunization History  Administered Date(s) Administered   Fluad Quad(high Dose 65+) 09/08/2018, 11/12/2019   PFIZER(Purple Top)SARS-COV-2 Vaccination 02/11/2019, 03/04/2019, 10/15/2019   Tdap 04/14/2011   Zoster, Live 04/14/2011    Diabetes Related Lab Review: Lab Results  Component Value Date   HGBA1C 6.5 (A) 07/14/2020   HGBA1C 8.5 (A) 04/07/2020   HGBA1C 7.7 (A) 01/02/2020    Lab Results  Component Value Date   MICROALBUR <0.7 01/02/2020   Lab Results  Component Value Date   CREATININE 1.52 (H) 01/02/2020   BUN 30 (H) 01/02/2020   NA 132 (L) 01/02/2020   K 3.8 01/02/2020   CL 96 01/02/2020   CO2 29 01/02/2020   Lab Results  Component Value Date   CHOL 219 (H) 01/02/2020   CHOL 158 12/21/2018   CHOL 158  09/29/2017   Lab Results  Component Value Date   HDL 55.70 01/02/2020   HDL 47.00 12/21/2018   HDL 54.80 09/29/2017   Lab Results  Component Value Date   LDLCALC 77 12/21/2018   LDLCALC 75 09/29/2017   Lab Results  Component Value Date   TRIG 259.0 (H) 01/02/2020   TRIG 172.0 (H) 12/21/2018   TRIG 139.0 09/29/2017   Lab Results  Component Value Date   CHOLHDL 4 01/02/2020   CHOLHDL 3 12/21/2018   CHOLHDL 3 09/29/2017   Lab Results  Component Value Date   LDLDIRECT 134.0 01/02/2020   The 10-year ASCVD risk score Denman George DC Jr., et al., 2013) is: 22.7%   Values used to calculate the score:      Age: 39 years     Sex: Female     Is Non-Hispanic African American: Yes     Diabetic: Yes     Tobacco smoker: No     Systolic Blood Pressure: 116 mmHg     Is BP treated: Yes     HDL Cholesterol: 55.7 mg/dL     Total Cholesterol: 219 mg/dL I have reviewed the PMH, Fam and Soc history. Patient Active Problem List   Diagnosis Date Noted   Cardiac pacemaker in situ 03/19/2019    Priority: High    Added automatically from request for surgery [id number]     CKD (chronic kidney disease) stage 3, GFR 30-59 ml/min (HCC) 10/04/2016    Priority: High   History of complete heart block 10/04/2012    Priority: High    Overview:  Dr. Mayme Genta, S/p pacemaker, stable 2014/cla No ischemia by stress testing 2011     Combined hyperlipidemia associated with type 2 diabetes mellitus (HCC) 11/04/2011    Priority: High   Controlled type 2 diabetes mellitus with stage 3 chronic kidney disease, without long-term current use of insulin (HCC) 11/04/2011    Priority: High   Benign essential hypertension 01/11/2011    Priority: High   Spondylosis of lumbar region without myelopathy or radiculopathy 07/14/2020    Priority: Medium   Obesity (BMI 30-39.9) 01/12/2016    Priority: Medium   Primary localized osteoarthrosis of left lower leg 07/16/2014    Priority: Medium   Age-related osteoporosis without fracture 06/01/2011    Priority: Medium    T = -2.6 04/2017 in L hip. T = -1.2 05/2011; recheck at age 9.     Encounter for screening colonoscopy 02/21/2017    Priority: Low    2/19 colon - no polyps Recommend screening exam in 2/29 - [facility]     Vitamin D deficiency 04/16/2011    Priority: Low    Social History: Patient  reports that she has never smoked. She has never used smokeless tobacco. She reports that she does not drink alcohol and does not use drugs.  Review of Systems: Ophthalmic: negative for eye pain, loss of vision or double vision Cardiovascular: negative for chest  pain Respiratory: negative for SOB or persistent cough Gastrointestinal: negative for abdominal pain Genitourinary: negative for dysuria or gross hematuria MSK: negative for foot lesions Neurologic: negative for weakness or gait disturbance  Objective  Vitals: BP 116/80   Pulse 93   Temp 98.2 F (36.8 C) (Temporal)   Wt 192 lb 6.4 oz (87.3 kg)   SpO2 97%   BMI 34.08 kg/m  General: well appearing, no acute distress  Psych:  Alert and oriented, normal mood and affect HEENT:  Normocephalic, atraumatic, moist mucous membranes, supple  neck  Cardiovascular:  Nl S1 and S2, RRR without murmur, gallop or rub. no edema Respiratory:  Good breath sounds bilaterally, CTAB with normal effort, no rales     Diabetic education: ongoing education regarding chronic disease management for diabetes was given today. We continue to reinforce the ABC's of diabetic management: A1c (<7 or 8 dependent upon patient), tight blood pressure control, and cholesterol management with goal LDL < 100 minimally. We discuss diet strategies, exercise recommendations, medication options and possible side effects. At each visit, we review recommended immunizations and preventive care recommendations for diabetics and stress that good diabetic control can prevent other problems. See below for this patient's data.   Commons side effects, risks, benefits, and alternatives for medications and treatment plan prescribed today were discussed, and the patient expressed understanding of the given instructions. Patient is instructed to call or message via MyChart if he/she has any questions or concerns regarding our treatment plan. No barriers to understanding were identified. We discussed Red Flag symptoms and signs in detail. Patient expressed understanding regarding what to do in case of urgent or emergency type symptoms.  Medication list was reconciled, printed and provided to the patient in AVS. Patient instructions and summary  information was reviewed with the patient as documented in the AVS. This note was prepared with assistance of Dragon voice recognition software. Occasional wrong-word or sound-a-like substitutions may have occurred due to the inherent limitations of voice recognition software  This visit occurred during the SARS-CoV-2 public health emergency.  Safety protocols were in place, including screening questions prior to the visit, additional usage of staff PPE, and extensive cleaning of exam room while observing appropriate contact time as indicated for disinfecting solutions.

## 2020-07-15 ENCOUNTER — Encounter: Payer: Self-pay | Admitting: Family Medicine

## 2020-07-15 DIAGNOSIS — E871 Hypo-osmolality and hyponatremia: Secondary | ICD-10-CM

## 2020-07-15 HISTORY — DX: Hypo-osmolality and hyponatremia: E87.1

## 2020-07-15 LAB — COMPREHENSIVE METABOLIC PANEL
ALT: 16 U/L (ref 0–35)
AST: 17 U/L (ref 0–37)
Albumin: 4.3 g/dL (ref 3.5–5.2)
Alkaline Phosphatase: 55 U/L (ref 39–117)
BUN: 22 mg/dL (ref 6–23)
CO2: 26 mEq/L (ref 19–32)
Calcium: 9.1 mg/dL (ref 8.4–10.5)
Chloride: 99 mEq/L (ref 96–112)
Creatinine, Ser: 1.25 mg/dL — ABNORMAL HIGH (ref 0.40–1.20)
GFR: 44 mL/min — ABNORMAL LOW (ref >60.00)
Glucose, Bld: 90 mg/dL (ref 70–99)
Potassium: 4.4 mEq/L (ref 3.5–5.1)
Sodium: 133 mEq/L — ABNORMAL LOW (ref 135–145)
Total Bilirubin: 0.5 mg/dL (ref 0.2–1.2)
Total Protein: 7.7 g/dL (ref 6.0–8.3)

## 2020-07-15 LAB — LIPID PANEL
Cholesterol: 149 mg/dL (ref 0–200)
HDL: 54.3 mg/dL (ref >39.00)
LDL Cholesterol: 70 mg/dL (ref 0–99)
NonHDL: 94.21
Total CHOL/HDL Ratio: 3
Triglycerides: 122 mg/dL (ref 0.0–149.0)
VLDL: 24.4 mg/dL (ref 0.0–40.0)

## 2020-07-15 NOTE — Telephone Encounter (Signed)
Dr. Mardelle Matte, okay to fill Diclofenac 75 mg?

## 2020-08-04 ENCOUNTER — Ambulatory Visit: Payer: Medicare PPO | Attending: Family Medicine

## 2020-08-04 ENCOUNTER — Other Ambulatory Visit: Payer: Self-pay

## 2020-08-04 ENCOUNTER — Ambulatory Visit: Payer: Medicare PPO

## 2020-08-04 DIAGNOSIS — R293 Abnormal posture: Secondary | ICD-10-CM | POA: Diagnosis not present

## 2020-08-04 DIAGNOSIS — M25551 Pain in right hip: Secondary | ICD-10-CM | POA: Diagnosis not present

## 2020-08-04 DIAGNOSIS — R262 Difficulty in walking, not elsewhere classified: Secondary | ICD-10-CM | POA: Diagnosis not present

## 2020-08-04 DIAGNOSIS — M25552 Pain in left hip: Secondary | ICD-10-CM | POA: Insufficient documentation

## 2020-08-04 NOTE — Therapy (Signed)
[facility] Outpatient Rehabilitation [facility] [address] [place], [place], [address] Phone: [phone]   Fax:  [phone]  Physical Therapy Evaluation  Patient Details  Name: Amy Bartlett MRN: [id number] Date of Birth: May 20, 1950 Referring Provider (PT): Amy Ora, MD   Encounter Date: 08/04/2020   PT End of Session - 08/04/20 0935     Visit Number 1    Number of Visits 16    Date for PT Re-Evaluation 09/29/20    Authorization Type [employer]/ [employer]    Progress Note Due on Visit 10    PT Start Time [phone]    PT Stop Time 0938    PT Time Calculation (min) 55 min    Equipment Utilized During Treatment Other (comment)   SEC   Activity Tolerance Patient tolerated treatment well    Behavior During Therapy [facility] for tasks assessed/performed             Past Medical History:  Diagnosis Date   Arthritis    Chicken pox    Diabetes mellitus (HCC)    Hyperlipidemia    Hypertension    Hyponatremia - HCTZ 07/15/2020   Osteopenia     Past Surgical History:  Procedure Laterality Date   CARDIAC PACEMAKER PLACEMENT     CESAREAN SECTION  1974, 1977, 1979   REDUCTION MAMMAPLASTY Bilateral 2009   TUBAL LIGATION      There were no vitals filed for this visit.    Subjective Assessment - 08/04/20 0842     Subjective The patient reports that she has been problems with her legs hurting when she walks.  Amy Bartlett reports lateral hip pain with walking and it can go down the right side of the leg to the lower leg.  The patient reports that she has diabetic neuropathy in the feet.  She is limited with walking to 1-2 blocks.  She does not currently has a cane.  She does use a grocery cart at the store and she can walk further.  She reports for the past three weeks it has not been that bad as before.  She was taking care of her sister that has cancer and she was on her feet a lot.  They did an x-ray of the back and the found arthritis.  She reports feeling  tight muscles in the back that is constant.    Limitations Standing;Walking;Lifting;House hold activities    How long can you sit comfortably? unlimited    How long can you stand comfortably? 20-30 minutes    How long can you walk comfortably? 1-2 blocks.    Diagnostic tests CLINICAL DATA:  Left leg and back pain.     EXAM:  DG HIP (WITH OR WITHOUT PELVIS) 2V BILAT     COMPARISON:  None.     FINDINGS:  Enthesopathic changes along the iliac crests. The hips are normal in  appearance without significant degenerative change. No fracture or  dislocation. No other significant abnormalities.     IMPRESSION:  No acute abnormalities. No definite cause for left leg pain  identified.EXAM:  LUMBAR SPINE - COMPLETE 4+ VIEW     COMPARISON:  None.     FINDINGS:  Grade 1 anterolisthesis of L4 versus L5. No other malalignment. No  fractures. Multilevel degenerative disc disease with small anterior  osteophytes. Lower lumbar facet degenerative changes.     IMPRESSION:  Degenerative disc disease and lower lumbar facet degenerative  changes.     Grade 1 anterolisthesis of L4 versus  L5.    Patient Stated Goals To be able to walk over 2 blocks.    Currently in Pain? Yes    Pain Score 2    At worse 7/10 with walking 2 blocks   Pain Location Hip    Pain Orientation Lateral;Left;Right    Pain Radiating Towards Right lower leg.    Aggravating Factors  walking and standing    Pain Relieving Factors Medication                [facility] PT Assessment - 08/04/20 0001       Assessment   Medical Diagnosis M47.816 (ICD-10-CM) - Spondylosis of lumbar region without myelopathy or radiculopathy    Referring Provider (PT) Amy OraAndy, Amy L, MD    Onset Date/Surgical Date --   gradual onset for 3 years.  Progressed 4 weeks ago with taking care of her sister.   Hand Dominance Right    Prior Therapy Yes about 3 years ago      Precautions   Precautions None      Restrictions   Weight Bearing Restrictions No      Balance  Screen   Has the patient fallen in the past 6 months No    Has the patient had a decrease in activity level because of a fear of falling?  No    Is the patient reluctant to leave their home because of a fear of falling?  No      Home [employer] managernvironment   Living Environment Private residence    Living Arrangements Spouse/significant other    Type of Home House    Home Layout One level      Prior Function   Level of Independence Independent    Vocation Retired    Leisure walking, pool exercises      Cognition   Overall Cognitive Status Within Functional Limits for tasks assessed      Squat   Comments 1/3 depth with posterior hip tightness      Single Leg Stance   Comments Right: 3 seconds  Left: 3 seconds      Posture/Postural Control   Posture/Postural Control Postural limitations    Postural Limitations Increased lumbar lordosis      AROM   AROM Assessment Site --   pain increase in hips with lumbar AROM assessment   Lumbar Flexion 75%    Lumbar Extension 50%    Lumbar - Right Side Bend To Knee    Lumbar - Left Side Bend To knee    Lumbar - Right Rotation 50%    Lumbar - Left Rotation 25%      Strength   Right Hip Flexion 5/5    Right Hip Extension 4+/5    Right Hip External Rotation  5/5    Right Hip Internal Rotation 5/5    Right Hip ABduction 4+/5    Left Hip Flexion 5/5    Left Hip Extension 4+/5    Left Hip External Rotation 5/5    Left Hip Internal Rotation 5/5    Left Hip ABduction 4+/5    Lumbar Flexion 3+/5    Lumbar Extension 4-/5                        Objective measurements completed on examination: See above findings.       [facility] - 08/04/20 0001       Ambulation/Gait   Ambulation/Gait Yes    Ambulation/Gait Assistance 7:  Independent    Gait Pattern Step-through pattern;Decreased trunk rotation;Trunk flexed      Exercises   Exercises Lumbar      Lumbar Exercises: Stretches   Lower Trunk Rotation 30  seconds;3 reps    Pelvic Tilt 15 reps;5 seconds    ITB Stretch 30 seconds;Left;Right;3 reps    Piriformis Stretch Left;Right;3 reps;30 seconds      Lumbar Exercises: Supine   Ab Set 10 reps;5 seconds                    PT Education - 08/04/20 0936     Education Details POC, use of SEC for ambulation, HEP    Person(s) Educated Patient    Methods Explanation;Demonstration;Handout    Comprehension Verbalized understanding              PT Short Term Goals - 08/04/20 0936       PT SHORT TERM GOAL #1   Title The patient will be independent in a basic HEP while out of town    Baseline no HEP    Time 2    Period Weeks    Status New    Target Date 08/18/20      PT SHORT TERM GOAL #2   Title The patient will be independent in use of SEC for community ambulation to manage hip pain.    Baseline 2 weeks    Time 2    Period Weeks    Status New    Target Date 08/18/20               PT Long Term Goals - 08/04/20 0937       PT LONG TERM GOAL #1   Title The patient will report improvement of walking distance to 4 blocks with pain under 4/10.    Baseline 2 blocks with pain 7/10    Time 8    Period Weeks    Status New    Target Date 09/29/20      PT LONG TERM GOAL #2   Title The patient will be able to stand for 20-30 minutes with low back pain under 4/10    Baseline 7/10 pain    Time 8    Period Weeks    Status New    Target Date 09/29/20      PT LONG TERM GOAL #3   Title The patient will present with improvement of core flexor strength to 4/5 for improvement of posture and walking distance.    Baseline 3+/5    Time 8    Period Weeks    Status New    Target Date 09/29/20      PT LONG TERM GOAL #4   Title The patient will be able to squat to 1/2 depth for proper lifting mechanics.    Baseline 1/3 depth    Time 8    Period Weeks    Status New    Target Date 09/29/20                    Plan - 08/04/20 0919     Clinical Impression  Statement Wyndi is a 70 y/o female who reports a gradual onset of bilateral lateral hip pain.  She reports that her symptoms increased with taking care of her sister who was diagnosised with cancer.  The patient reports that standing and walking will increase her pain.  She is limited with walking to 2 blocks.  The patient did have  x-rays that revealed degenerative disc and Grade 1 anterolisthesis of L4 versus L5.  The patient presents with abnormal standing posture with increase of lumbar lordosis and weakness of the core musculature.  Her lower extremity strength is within functional limits. The patient will be out of town for the next 2 weeks to help take care of her sistier again.  The patient was provided education on use of cane for walking and a home exercise program.  We will resume physical therapy when she returns for improvement of her standing and walking tolerance.    Personal Factors and Comorbidities Comorbidity 1    Comorbidities DM with neuropathy    Examination-Activity Limitations Locomotion Level;Caring for Others;Carry;Stand;Lift;Squat    Examination-Participation Restrictions Cleaning;Meal Prep;Shop;Laundry    Stability/Clinical Decision Making Stable/Uncomplicated    Rehab Potential Good    PT Frequency 2x / week    PT Duration 8 weeks    PT Treatment/Interventions ADLs/Self Care Home Management;Aquatic Therapy;Electrical Stimulation;Stair training;Gait training;Functional mobility training;Therapeutic activities;Therapeutic exercise;Balance training;Neuromuscular re-education;Manual techniques;Patient/family education;Taping;Spinal Manipulations;Joint Manipulations    PT Next Visit Plan Review HEP, progress core stabilization, hip strengthening, gait training, possible aquatic therapy    PT Home Exercise Plan Access Code: [email or web address]    Consulted and Agree with Plan of Care Patient             Patient will benefit from skilled therapeutic intervention in order to improve  the following deficits and impairments:  Abnormal gait, Difficulty walking, Pain, Decreased balance, Impaired flexibility, Improper body mechanics, Decreased strength, Postural dysfunction  Visit Diagnosis: Bilateral hip pain  Difficulty in walking, not elsewhere classified  Abnormal posture     Problem List Patient Active Problem List   Diagnosis Date Noted   Hyponatremia - HCTZ 07/15/2020   Spondylosis of lumbar region without myelopathy or radiculopathy 07/14/2020   Cardiac pacemaker in situ 03/19/2019   Encounter for screening colonoscopy 02/21/2017   CKD (chronic kidney disease) stage 3, GFR 30-59 ml/min (HCC) 10/04/2016   Obesity (BMI 30-39.9) 01/12/2016   Primary localized osteoarthrosis of left lower leg 07/16/2014   History of complete heart block 10/04/2012   Combined hyperlipidemia associated with type 2 diabetes mellitus (HCC) 11/04/2011   Controlled type 2 diabetes mellitus with stage 3 chronic kidney disease, without long-term current use of insulin (HCC) 11/04/2011   Age-related osteoporosis without fracture 06/01/2011   Vitamin D deficiency 04/16/2011   Benign essential hypertension 01/11/2011   Sharol Roussel, PT, DPT, OCS, Crt. DN Robet Leu 08/04/2020, 9:45 AM  Referring diagnosis? Lumbar spondylosis Treatment diagnosis? (if different than referring diagnosis) Bilateral hip pain, difficulty walking, abnormal posture What was this (referring dx) caused by? []  Surgery []  Fall [x]  Ongoing issue [x]  Arthritis []  Other: ____________  Laterality: []  Rt []  Lt [x]  Both  Check all possible CPT codes:      [x]  97110 (Therapeutic Exercise)  []  92507 (SLP Treatment)  [x]  97112 (Neuro Re-ed)   []  92526 (Swallowing Treatment)   [x]  97116 (Gait Training)   []  (Cognitive Training, 1st 15 minutes) [x]  97140 (Manual Therapy)   []  97130 (Cognitive Training, each add'Bartlett 15 minutes)  [x]  97530 (Therapeutic Activities)  []  Other, List CPT Code ____________    [x]   97535 (Self Care)       []  All codes above (97110 - 97535)  []  97012 (Mechanical Traction)  [x]  97014 (E-stim Unattended)  []  97032 (E-stim manual)  []  97033 (Ionto)  []  97035 (Ultrasound)  []  97760 (Orthotic Fit) []  (Physical  Performance Training)  [id number] (Aquatic Therapy)  [id number] (Contrast Bath)  [phone] (Paraffin)  97597 (Wound Care 1st 20 sq cm)  97598 (Wound Care each add'Bartlett 20 sq cm)  97016 (Vasopneumatic Device)  [phone] Public affairs consultant)  [phone] (Prosthetic Training)   [facility] [address] [place], [place], [address] Phone: [phone]   Fax:  [phone]  Name: Amy Bartlett MRN: [id number] Date of Birth: 06/20/1950

## 2020-08-04 NOTE — Patient Instructions (Signed)
Access Code: [email or web address] URL: [email or web address] Date: 08/04/2020 Prepared by: Sharol Roussel  Exercises Supine Posterior Pelvic Tilt - 1 x daily - 7 x weekly - 1 sets - 15 reps - 5 hold Supine Piriformis Stretch with Foot on Ground - 1 x daily - 7 x weekly - 1 sets - 3 reps - 30 hold Supine Lower Trunk Rotation - 1 x daily - 7 x weekly - 1 sets - 3 reps - 30 hold Supine Figure 4 Piriformis Stretch - 1 x daily - 7 x weekly - 1 sets - 3 reps - 30 hold Supine Posterior Pelvic Tilt - 1 x daily - 7 x weekly - 1 sets - 10 reps - 10 hold  Patient Education Reciprocal Gait with [employer]

## 2020-08-19 ENCOUNTER — Other Ambulatory Visit: Payer: Self-pay

## 2020-08-19 ENCOUNTER — Ambulatory Visit: Payer: Medicare PPO

## 2020-08-19 DIAGNOSIS — R293 Abnormal posture: Secondary | ICD-10-CM

## 2020-08-19 DIAGNOSIS — R262 Difficulty in walking, not elsewhere classified: Secondary | ICD-10-CM | POA: Diagnosis not present

## 2020-08-19 DIAGNOSIS — M25551 Pain in right hip: Secondary | ICD-10-CM

## 2020-08-19 DIAGNOSIS — M25552 Pain in left hip: Secondary | ICD-10-CM | POA: Diagnosis not present

## 2020-08-19 NOTE — Therapy (Signed)
[facility] Outpatient Rehabilitation [facility] [address] [place], [place], [address] Phone: [phone]   Fax:  [phone]  Physical Therapy Treatment  Patient Details  Name: Abra Lingenfelter MRN: [id number] Date of Birth: Nov 27, 1950 Referring Provider (PT): Willow Ora, MD   Encounter Date: 08/19/2020   PT End of Session - 08/19/20 0857     Visit Number 2    Number of Visits 16    Date for PT Re-Evaluation 09/29/20    Authorization Type [employer]/ [employer]    Progress Note Due on Visit 10    PT Start Time 0850    PT Stop Time 0934    PT Time Calculation (min) 44 min    Activity Tolerance Patient tolerated treatment well    Behavior During Therapy [facility] for tasks assessed/performed             Past Medical History:  Diagnosis Date   Arthritis    Chicken pox    Diabetes mellitus (HCC)    Hyperlipidemia    Hypertension    Hyponatremia - HCTZ 07/15/2020   Osteopenia     Past Surgical History:  Procedure Laterality Date   CARDIAC PACEMAKER PLACEMENT     CESAREAN SECTION  1974, 1977, 1979   REDUCTION MAMMAPLASTY Bilateral 2009   TUBAL LIGATION      There were no vitals filed for this visit.   Subjective Assessment - 08/19/20 0856     Subjective The patient reports that she was gone for the last two weeks taking care of her sister again.  She did not use a cane or do her exercises.  She reports soreness in the back and hips/    Limitations Standing;Walking;Lifting;House hold activities    How long can you stand comfortably? 20-30 minutes    How long can you walk comfortably? 1-2 blocks.    Patient Stated Goals To be able to walk over 2 blocks.    Currently in Pain? Yes    Pain Score 2     Pain Location Hip    Pain Orientation Left;Right;Lateral                [facility] PT Assessment - 08/19/20 0001       Assessment   Medical Diagnosis M47.816 (ICD-10-CM) - Spondylosis of lumbar region without myelopathy or radiculopathy     Referring Provider (PT) Willow Ora, MD                           [facility] Adult PT Treatment/Exercise - 08/19/20 0001       Posture/Postural Control   Posture/Postural Control Postural limitations    Postural Limitations Increased lumbar lordosis      Lumbar Exercises: Stretches   Lower Trunk Rotation 30 seconds;3 reps    Pelvic Tilt 15 reps;5 seconds    ITB Stretch 30 seconds;Left;Right;2 reps    Piriformis Stretch Left;Right;3 reps;30 seconds    Figure 4 Stretch 30 seconds;2 reps;Supine;With overpressure      Lumbar Exercises: Supine   Ab Set 10 reps;5 seconds    AB Set Limitations with hip adduction    Clam 5 seconds;10 reps    Clam Limitations blue TB      Manual Therapy   Manual Therapy Soft tissue mobilization;Manual Traction    Soft tissue mobilization STM TFL bilaterally    Manual Traction Supine manual LE traction grade II  PT Short Term Goals - 08/04/20 0936       PT SHORT TERM GOAL #1   Title The patient will be independent in a basic HEP while out of town    Baseline no HEP    Time 2    Period Weeks    Status New    Target Date 08/18/20      PT SHORT TERM GOAL #2   Title The patient will be independent in use of SEC for community ambulation to manage hip pain.    Baseline 2 weeks    Time 2    Period Weeks    Status New    Target Date 08/18/20               PT Long Term Goals - 08/04/20 0937       PT LONG TERM GOAL #1   Title The patient will report improvement of walking distance to 4 blocks with pain under 4/10.    Baseline 2 blocks with pain 7/10    Time 8    Period Weeks    Status New    Target Date 09/29/20      PT LONG TERM GOAL #2   Title The patient will be able to stand for 20-30 minutes with low back pain under 4/10    Baseline 7/10 pain    Time 8    Period Weeks    Status New    Target Date 09/29/20      PT LONG TERM GOAL #3   Title The patient will present with  improvement of core flexor strength to 4/5 for improvement of posture and walking distance.    Baseline 3+/5    Time 8    Period Weeks    Status New    Target Date 09/29/20      PT LONG TERM GOAL #4   Title The patient will be able to squat to 1/2 depth for proper lifting mechanics.    Baseline 1/3 depth    Time 8    Period Weeks    Status New    Target Date 09/29/20                   Plan - 08/19/20 0858     Clinical Impression Statement Natash reports that she has not been doing the exercises as she as taking care of her sister again for the last 2 weeks.  She reports that she will hopefully not be having to take care of her anymore as they are able to stop treatment.  Reviewed the patient's HEP today and advised her to perform at home, now that she is not having to take care of her sister.  The patient did have improved control with posterior pelvic tilts today.  She reports that she is still limited with walking to 1-2 blocks.  Recommend continued therapy for core /hip stabilization to improve standing/ walking tolerance and ADLs.    Personal Factors and Comorbidities Comorbidity 1    Comorbidities DM with neuropathy    Examination-Activity Limitations Locomotion Level;Caring for Others;Carry;Stand;Lift;Squat    Examination-Participation Restrictions Cleaning;Meal Prep;Shop;Laundry    PT Treatment/Interventions ADLs/Self Care Home Management;Aquatic Therapy;Electrical Stimulation;Stair training;Gait training;Functional mobility training;Therapeutic activities;Therapeutic exercise;Balance training;Neuromuscular re-education;Manual techniques;Patient/family education;Taping;Spinal Manipulations;Joint Manipulations    PT Next Visit Plan Review HEP, progress core stabilization, hip strengthening, gait training, possible aquatic therapy    PT Home Exercise Plan Access Code: [email or web address]    Consulted and Agree with Plan of  Care Patient             Patient will benefit from  skilled therapeutic intervention in order to improve the following deficits and impairments:  Abnormal gait, Difficulty walking, Pain, Decreased balance, Impaired flexibility, Improper body mechanics, Decreased strength, Postural dysfunction  Visit Diagnosis: Bilateral hip pain  Difficulty in walking, not elsewhere classified  Abnormal posture     Problem List Patient Active Problem List   Diagnosis Date Noted   Hyponatremia - HCTZ 07/15/2020   Spondylosis of lumbar region without myelopathy or radiculopathy 07/14/2020   Cardiac pacemaker in situ 03/19/2019   Encounter for screening colonoscopy 02/21/2017   CKD (chronic kidney disease) stage 3, GFR 30-59 ml/min (HCC) 10/04/2016   Obesity (BMI 30-39.9) 01/12/2016   Primary localized osteoarthrosis of left lower leg 07/16/2014   History of complete heart block 10/04/2012   Combined hyperlipidemia associated with type 2 diabetes mellitus (HCC) 11/04/2011   Controlled type 2 diabetes mellitus with stage 3 chronic kidney disease, without long-term current use of insulin (HCC) 11/04/2011   Age-related osteoporosis without fracture 06/01/2011   Vitamin D deficiency 04/16/2011   Benign essential hypertension 01/11/2011   Sharol Roussel, PT, DPT, OCS, Crt. DN  Robet Leu 08/19/2020, 9:37 AM  [facility] [address] [place], [place], [address] Phone: [phone]   Fax:  [phone]  Name: Joyous Gleghorn MRN: [id number] Date of Birth: May 24, 1950

## 2020-08-25 ENCOUNTER — Ambulatory Visit: Payer: Medicare PPO | Admitting: Physical Therapy

## 2020-08-25 ENCOUNTER — Other Ambulatory Visit: Payer: Self-pay

## 2020-08-25 ENCOUNTER — Encounter: Payer: Self-pay | Admitting: Physical Therapy

## 2020-08-25 DIAGNOSIS — M25552 Pain in left hip: Secondary | ICD-10-CM

## 2020-08-25 DIAGNOSIS — M25551 Pain in right hip: Secondary | ICD-10-CM | POA: Diagnosis not present

## 2020-08-25 DIAGNOSIS — R293 Abnormal posture: Secondary | ICD-10-CM

## 2020-08-25 DIAGNOSIS — R262 Difficulty in walking, not elsewhere classified: Secondary | ICD-10-CM

## 2020-08-25 NOTE — Therapy (Signed)
[facility] [address] [place], [place], [address] Phone: [phone]   Fax:  [phone]  Physical Therapy Treatment  Patient Details  Name: Amy Bartlett MRN: [id number] Date of Birth: February 13, 1950 Referring Provider (PT): Willow Ora, MD   Encounter Date: 08/25/2020   PT End of Session - 08/25/20 0944     Visit Number 3    Number of Visits 16    Date for PT Re-Evaluation 09/29/20    Authorization Type Tricare/ [employer]    PT Start Time [phone]    PT Stop Time 1017    PT Time Calculation (min) 41 min    Activity Tolerance Patient tolerated treatment well    Behavior During Therapy [facility] for tasks assessed/performed             Past Medical History:  Diagnosis Date   Arthritis    Chicken pox    Diabetes mellitus (HCC)    Hyperlipidemia    Hypertension    Hyponatremia - HCTZ 07/15/2020   Osteopenia     Past Surgical History:  Procedure Laterality Date   CARDIAC PACEMAKER PLACEMENT     CESAREAN SECTION  1974, 1977, 1979   REDUCTION MAMMAPLASTY Bilateral 2009   TUBAL LIGATION      There were no vitals filed for this visit.   Subjective Assessment - 08/25/20 0940     Subjective Its not bad when I sit.  Going to [place] tomorrow.  Its a soreness.  She does not walk with a cane, does not have one. She recalls a few yrs ago an instance of pulling her hip and needed PT after that.  She was exercising regularly at that time. Did the pool.    Currently in Pain? No/denies    Pain Score 0-No pain    Pain Location Hip    Pain Orientation Right;Left   R>L   Pain Descriptors / Indicators Sore    Pain Type Chronic pain    Pain Onset More than a month ago    Pain Frequency Intermittent    Aggravating Factors  walking, standing    Pain Relieving Factors medication, sitting, rest              [facility] Adult PT Treatment/Exercise:  Therapeutic Exercise: - NuStep L5 UE and LE Seated piriformis stretch 2 x 30 sec  , cues  ITB and Hamstring , 30 sec x 2 each supine   Ab set with post pelvic tilt x 10  Core press with ball x 10  Lumbar stabilization  Heel slide x 10   Clam x 10   Bent knee raise x 10  Bridge x 10 green band partial ROM   Self-care/Home Management: - HEP regular routine and walking      PT Short Term Goals - 08/25/20 0945       PT SHORT TERM GOAL #1   Title The patient will be independent in a basic HEP while out of town    Status On-going      PT SHORT TERM GOAL #2   Title The patient will be independent in use of SEC for community ambulation to manage hip pain.    Baseline does not use this or own a cane    Status On-going               PT Long Term Goals - 08/04/20 1638       PT LONG TERM GOAL #1   Title The  patient will report improvement of walking distance to 4 blocks with pain under 4/10.    Baseline 2 blocks with pain 7/10    Time 8    Period Weeks    Status New    Target Date 09/29/20      PT LONG TERM GOAL #2   Title The patient will be able to stand for 20-30 minutes with low back pain under 4/10    Baseline 7/10 pain    Time 8    Period Weeks    Status New    Target Date 09/29/20      PT LONG TERM GOAL #3   Title The patient will present with improvement of core flexor strength to 4/5 for improvement of posture and walking distance.    Baseline 3+/5    Time 8    Period Weeks    Status New    Target Date 09/29/20      PT LONG TERM GOAL #4   Title The patient will be able to squat to 1/2 depth for proper lifting mechanics.    Baseline 1/3 depth    Time 8    Period Weeks    Status New    Target Date 09/29/20                   Plan - 08/25/20 0951     Clinical Impression Statement Patient cont to have min discomfort overall in R >L hip unless walking > 20 min.  She does not push this with walking program, only in community/retail/home.  She has a steady gait, walked 400 feet in 2 min with no increase in hip pain.  Encouraged her to perform HEP daily, even if it is only 2-3 moves. No increased pain today. OK to schedule 2 x per week    PT Treatment/Interventions ADLs/Self Care Home Management;Aquatic Therapy;Electrical Stimulation;Stair training;Gait training;Functional mobility training;Therapeutic activities;Therapeutic exercise;Balance training;Neuromuscular re-education;Manual techniques;Patient/family education;Taping;Spinal Manipulations;Joint Manipulations;Moist Heat    PT Next Visit Plan Review HEP, progress core stabilization, hip strengthening, gait training, possible aquatic therapy    PT Home Exercise Plan Access Code: [email or web address]    Consulted and Agree with Plan of Care Patient             Patient will benefit from skilled therapeutic intervention in order to improve the following deficits and impairments:  Abnormal gait, Difficulty walking, Pain, Decreased balance, Impaired flexibility, Improper body mechanics, Decreased strength, Postural dysfunction  Visit Diagnosis: Bilateral hip pain  Abnormal posture  Difficulty in walking, not elsewhere classified     Problem List Patient Active Problem List   Diagnosis Date Noted   Hyponatremia - HCTZ 07/15/2020   Spondylosis of lumbar region without myelopathy or radiculopathy 07/14/2020   Cardiac pacemaker in situ 03/19/2019   Encounter for screening colonoscopy 02/21/2017   CKD (chronic kidney disease) stage 3, GFR 30-59 ml/min (HCC) 10/04/2016   Obesity (BMI 30-39.9) 01/12/2016   Primary localized osteoarthrosis of left lower leg 07/16/2014   History of complete heart block 10/04/2012   Combined hyperlipidemia associated with type 2 diabetes mellitus (HCC) 11/04/2011   Controlled type 2 diabetes mellitus with stage 3 chronic kidney disease, without long-term current use of insulin (HCC) 11/04/2011   Age-related osteoporosis without fracture 06/01/2011   Vitamin D deficiency 04/16/2011   Benign essential hypertension 01/11/2011     Shaley Leavens 08/25/2020, 10:50 AM  [facility] [address] [place], [place], [address] Phone: [phone]   Fax:  [phone]  Name: Keltie Labell MRN: [id number] Date of Birth: 06/14/1950   [name], PT 08/25/20 10:50 AM Phone: [phone] Fax: [phone]

## 2020-09-02 ENCOUNTER — Encounter: Payer: Self-pay | Admitting: Physical Therapy

## 2020-09-02 ENCOUNTER — Ambulatory Visit: Payer: Medicare PPO | Admitting: Physical Therapy

## 2020-09-02 ENCOUNTER — Other Ambulatory Visit: Payer: Self-pay

## 2020-09-02 DIAGNOSIS — R293 Abnormal posture: Secondary | ICD-10-CM | POA: Diagnosis not present

## 2020-09-02 DIAGNOSIS — M25551 Pain in right hip: Secondary | ICD-10-CM

## 2020-09-02 DIAGNOSIS — M25552 Pain in left hip: Secondary | ICD-10-CM | POA: Diagnosis not present

## 2020-09-02 DIAGNOSIS — R262 Difficulty in walking, not elsewhere classified: Secondary | ICD-10-CM | POA: Diagnosis not present

## 2020-09-02 NOTE — Therapy (Signed)
[place], [place], [address] Phone: [phone]   Fax:  [phone]  Physical Therapy Treatment  Patient Details  Name: Amy Bartlett MRN: [id number] Date of Birth: February 11, 1950 Referring Provider (PT): Leamon Arnt, MD   Encounter Date: 09/02/2020   PT End of Session - 09/02/20 0855     Visit Number 4    Number of Visits 16    Date for PT Re-Evaluation 09/29/20    Authorization Type T[employer]/ [employer]    PT Start Time [phone]    PT Stop Time 0930    PT Time Calculation (min) 38 min    Activity Tolerance Patient tolerated treatment well    Behavior During Therapy [facility] for tasks assessed/performed             Past Medical History:  Diagnosis Date   Arthritis    Chicken pox    Diabetes mellitus ([place])    Hyperlipidemia    Hypertension    Hyponatremia - HCTZ 07/15/2020   Osteopenia     Past Surgical History:  Procedure Laterality Date   [place] Bilateral 2009   TUBAL LIGATION      There were no vitals filed for this visit.   Subjective Assessment - 09/02/20 0853     Subjective Its sore today, Rt LE.    Currently in Pain? Yes    Pain Score 4     Pain Location Hip    Pain Orientation Right;Lateral    Pain Descriptors / Indicators Sore    Pain Type Chronic pain    Pain Radiating Towards Rt LE    Pain Onset More than a month ago    Pain Frequency Intermittent    Aggravating Factors  walking, standing    Pain Relieving Factors meds, rest               [facility] Adult PT Treatment/Exercise:  Therapeutic Exercise: - Supine mat stretching  Piriformis 30 sec x 5   Lateral hip knees crossed add rotation  Hamstring 30 sec x 3 strap   ITB 30 sec x 3 strap Supine core/hips  Ball squeeze x 10 add bridge x 10   Bridge with band (green) x 15   March with band x 15   Clam with band x 15   Nustep  L6 UE and LE for 8 min   Manual Therapy: - LAD x 3 Rt LE no change in sx   Self-care/Home Management: - HEP and routine       PT Education - 09/02/20 0913     Education Details HEP    Person(s) Educated Patient    Methods Explanation    Comprehension Verbalized understanding;Returned demonstration              PT Short Term Goals - 09/02/20 0909       PT SHORT TERM GOAL #1   Title The patient will be independent in a basic HEP while out of town    Baseline done "a little bit"    Status Partially Met      PT SHORT TERM GOAL #2   Title The patient will be independent in use of [place] for community ambulation to manage hip pain.    Status On-going               PT Long Term Goals - 08/04/20 3159  PT LONG TERM GOAL #1   Title The patient will report improvement of walking distance to 4 blocks with pain under 4/10.    Baseline 2 blocks with pain 7/10    Time 8    Period Weeks    Status New    Target Date 09/29/20      PT LONG TERM GOAL #2   Title The patient will be able to stand for 20-30 minutes with low back pain under 4/10    Baseline 7/10 pain    Time 8    Period Weeks    Status New    Target Date 09/29/20      PT LONG TERM GOAL #3   Title The patient will present with improvement of core flexor strength to 4/5 for improvement of posture and walking distance.    Baseline 3+/5    Time 8    Period Weeks    Status New    Target Date 09/29/20      PT LONG TERM GOAL #4   Title The patient will be able to squat to 1/2 depth for proper lifting mechanics.    Baseline 1/3 depth    Time 8    Period Weeks    Status New    Target Date 09/29/20                   Plan - 09/02/20 0857     Clinical Impression Statement Pt does find her pain reduced with simple stretching.  She did some stretching on her trip last week.  Encouraged her to do daily routine to make a lasting impact on her health and mobility. She used to go the [facility] for classes  and independent exercise as well.    PT Treatment/Interventions ADLs/Self Care Home Management;Aquatic Therapy;Electrical Stimulation;Stair training;Gait training;Functional mobility training;Therapeutic activities;Therapeutic exercise;Balance training;Neuromuscular re-education;Manual techniques;Patient/family education;Taping;Spinal Manipulations;Joint Manipulations;Moist Heat    PT Next Visit Plan Review HEP, progress core stabilization, hip strengthening, gait training, she does pool on her own    PT Home Exercise Plan Access Code: [email or web address]    Consulted and Agree with Plan of Care Patient             Patient will benefit from skilled therapeutic intervention in order to improve the following deficits and impairments:  Abnormal gait, Difficulty walking, Pain, Decreased balance, Impaired flexibility, Improper body mechanics, Decreased strength, Postural dysfunction  Visit Diagnosis: Bilateral hip pain  Abnormal posture  Difficulty in walking, not elsewhere classified     Problem List Patient Active Problem List   Diagnosis Date Noted   Hyponatremia - HCTZ 07/15/2020   Spondylosis of lumbar region without myelopathy or radiculopathy 07/14/2020   Cardiac pacemaker in situ 03/19/2019   Encounter for screening colonoscopy 02/21/2017   CKD (chronic kidney disease) stage 3, GFR 30-59 ml/min (HCC) 10/04/2016   Obesity (BMI 30-39.9) 01/12/2016   Primary localized osteoarthrosis of left lower leg 07/16/2014   History of complete heart block 10/04/2012   Combined hyperlipidemia associated with type 2 diabetes mellitus ([place]) 11/04/2011   Controlled type 2 diabetes mellitus with stage 3 chronic kidney disease, without long-term current use of insulin ([place]) 11/04/2011   Age-related osteoporosis without fracture 06/01/2011   Vitamin D deficiency 04/16/2011   Benign essential hypertension 01/11/2011    Judie Hollick 09/02/2020, 9:28 AM  [place]  [facility] [address] [place], [place], [address] Phone: [phone]   Fax:  [phone]  Name: Amy Bartlett MRN: [id number] Date  of Birth: 18-Dec-1950  Raeford Razor, PT 09/02/20 9:28 AM Phone: [phone] Fax: [phone]

## 2020-09-04 ENCOUNTER — Other Ambulatory Visit: Payer: Self-pay | Admitting: Family Medicine

## 2020-09-04 ENCOUNTER — Other Ambulatory Visit: Payer: Self-pay

## 2020-09-04 ENCOUNTER — Ambulatory Visit: Payer: Medicare PPO | Attending: Family Medicine | Admitting: Physical Therapy

## 2020-09-04 DIAGNOSIS — M25552 Pain in left hip: Secondary | ICD-10-CM | POA: Insufficient documentation

## 2020-09-04 DIAGNOSIS — R262 Difficulty in walking, not elsewhere classified: Secondary | ICD-10-CM | POA: Insufficient documentation

## 2020-09-04 DIAGNOSIS — R293 Abnormal posture: Secondary | ICD-10-CM | POA: Diagnosis not present

## 2020-09-04 DIAGNOSIS — M25551 Pain in right hip: Secondary | ICD-10-CM | POA: Insufficient documentation

## 2020-09-04 NOTE — Therapy (Signed)
[place], [place], [address] Phone: [phone]   Fax:  [phone]  Physical Therapy Treatment  Patient Details  Name: Amy Bartlett MRN: [id number] Date of Birth: 11/21/1950 Referring Provider (PT): Leamon Arnt, MD   Encounter Date: 09/04/2020   PT End of Session - 09/04/20 1033     Visit Number 5    Number of Visits 16    Date for PT Re-Evaluation 09/29/20    Authorization Type Tricare/ [employer]    PT Start Time 1030    PT Stop Time 1100    PT Time Calculation (min) 30 min    Activity Tolerance Patient tolerated treatment well    Behavior During Therapy [facility] for tasks assessed/performed             Past Medical History:  Diagnosis Date   Arthritis    Chicken pox    Diabetes mellitus ([place])    Hyperlipidemia    Hypertension    Hyponatremia - HCTZ 07/15/2020   Osteopenia     Past Surgical History:  Procedure Laterality Date   [place] Bilateral 2009   TUBAL LIGATION      There were no vitals filed for this visit.   Subjective Assessment - 09/04/20 1031     Subjective Aching in Rt hip and Rt leg, min. Arrives late    Diagnostic tests CLINICAL DATA:  Left leg and back pain.     EXAM:  DG HIP (WITH OR WITHOUT PELVIS) 2V BILAT     COMPARISON:  None.     FINDINGS:  Enthesopathic changes along the iliac crests. The hips are normal in  appearance without significant degenerative change. No fracture or  dislocation. No other significant abnormalities.     IMPRESSION:  No acute abnormalities. No definite cause for left leg pain  identified.EXAM:  LUMBAR SPINE - COMPLETE 4+ VIEW     COMPARISON:  None.     FINDINGS:  Grade 1 anterolisthesis of L4 versus L5. No other malalignment. No  fractures. Multilevel degenerative disc disease with small anterior  osteophytes. Lower lumbar facet degenerative changes.      IMPRESSION:  Degenerative disc disease and lower lumbar facet degenerative  changes.     Grade 1 anterolisthesis of L4 versus L5.    Currently in Pain? Yes                     [facility] Adult PT Treatment/Exercise:  Therapeutic Exercise: - Seated piriformis stretch x 30 sec x 2  Seated hamstring Airex eyes closed x 15 sec  Squats at counter top  x 15  Standing SLR abduction, extension on AIREX x 10 each  Airex marching 1 UE x 10 each slow pace Step ups 6 inch x 15 reverse step down  Supine ball press UEs x 10 added legs for a few sets x 5  Opp arm /Opp LE reverse dead bug for core x 10 (SLR), much easier on LLE  Sidelying hip abd circles x 20   Clam blue band x 15   Supine knee to chest x 30 sec x 2     PT Short Term Goals - 09/02/20 0909       PT SHORT TERM GOAL #1   Title The patient will be independent in a basic HEP while out of town    Baseline done "a  little bit"    Status Partially Met      PT SHORT TERM GOAL #2   Title The patient will be independent in use of [place] for community ambulation to manage hip pain.    Status On-going               PT Long Term Goals - 08/04/20 9518       PT LONG TERM GOAL #1   Title The patient will report improvement of walking distance to 4 blocks with pain under 4/10.    Baseline 2 blocks with pain 7/10    Time 8    Period Weeks    Status New    Target Date 09/29/20      PT LONG TERM GOAL #2   Title The patient will be able to stand for 20-30 minutes with low back pain under 4/10    Baseline 7/10 pain    Time 8    Period Weeks    Status New    Target Date 09/29/20      PT LONG TERM GOAL #3   Title The patient will present with improvement of core flexor strength to 4/5 for improvement of posture and walking distance.    Baseline 3+/5    Time 8    Period Weeks    Status New    Target Date 09/29/20      PT LONG TERM GOAL #4   Title The patient will be able to squat to 1/2 depth for proper lifting  mechanics.    Baseline 1/3 depth    Time 8    Period Weeks    Status New    Target Date 09/29/20                   Plan - 09/04/20 1034     Clinical Impression Statement Pt late today, cont to have steady pain in Rt leg when active. Focused on hip and core strengthening mostly in standing with appropriate muscle fatigue.  Reviewed possible causes of her pain, but basically her lumbar pain may contribute to the pain in her legs as it is mainly when she is standing that she feels the pain .  She did have a previous injury that may be now exacerbated. She was given info on seeing an Ortho MD or Sports Med as an alternative for further treatment.    PT Treatment/Interventions ADLs/Self Care Home Management;Aquatic Therapy;Electrical Stimulation;Stair training;Gait training;Functional mobility training;Therapeutic activities;Therapeutic exercise;Balance training;Neuromuscular re-education;Manual techniques;Patient/family education;Taping;Spinal Manipulations;Joint Manipulations;Moist Heat    PT Next Visit Plan Review HEP, progress core stabilization, hip strengthening, gait training, she does pool on her own    PT Home Exercise Plan Access Code: [email or web address]    Consulted and Agree with Plan of Care Patient             Patient will benefit from skilled therapeutic intervention in order to improve the following deficits and impairments:  Abnormal gait, Difficulty walking, Pain, Decreased balance, Impaired flexibility, Improper body mechanics, Decreased strength, Postural dysfunction  Visit Diagnosis: Bilateral hip pain  Abnormal posture  Difficulty in walking, not elsewhere classified     Problem List Patient Active Problem List   Diagnosis Date Noted   Hyponatremia - HCTZ 07/15/2020   Spondylosis of lumbar region without myelopathy or radiculopathy 07/14/2020   Cardiac pacemaker in situ 03/19/2019   Encounter for screening colonoscopy 02/21/2017   CKD (chronic kidney disease)  stage 3, GFR 30-59 ml/min (HCC) 10/04/2016  Obesity (BMI 30-39.9) 01/12/2016   Primary localized osteoarthrosis of left lower leg 07/16/2014   History of complete heart block 10/04/2012   Combined hyperlipidemia associated with type 2 diabetes mellitus ([place]) 11/04/2011   Controlled type 2 diabetes mellitus with stage 3 chronic kidney disease, without long-term current use of insulin ([place]) 11/04/2011   Age-related osteoporosis without fracture 06/01/2011   Vitamin D deficiency 04/16/2011   Benign essential hypertension 01/11/2011    Tamir Wallman 09/04/2020, 11:17 AM  [place], [place], [address] Phone: [phone]   Fax:  [phone]  Name: Amy Bartlett MRN: [id number] Date of Birth: 1950/06/05  Raeford Razor, PT 09/04/20 11:17 AM Phone: [phone] Fax: [phone]

## 2020-09-10 ENCOUNTER — Other Ambulatory Visit: Payer: Self-pay

## 2020-09-10 ENCOUNTER — Encounter: Payer: Self-pay | Admitting: Physical Therapy

## 2020-09-10 ENCOUNTER — Ambulatory Visit: Payer: Medicare PPO | Admitting: Physical Therapy

## 2020-09-10 DIAGNOSIS — M25552 Pain in left hip: Secondary | ICD-10-CM | POA: Diagnosis not present

## 2020-09-10 DIAGNOSIS — M25551 Pain in right hip: Secondary | ICD-10-CM | POA: Diagnosis not present

## 2020-09-10 DIAGNOSIS — R262 Difficulty in walking, not elsewhere classified: Secondary | ICD-10-CM | POA: Diagnosis not present

## 2020-09-10 DIAGNOSIS — R293 Abnormal posture: Secondary | ICD-10-CM

## 2020-09-10 NOTE — Therapy (Signed)
[facility] Outpatient Rehabilitation [facility] [address] [place], [place], [address] Phone: [phone]   Fax:  [phone]  Physical Therapy Treatment  Patient Details  Name: Amy Bartlett MRN: [id number] Date of Birth: March 23, 1950 Referring Provider (PT): Willow Ora, MD   Encounter Date: 09/10/2020   PT End of Session - 09/10/20 0853     Visit Number 6    Number of Visits 16    Date for PT Re-Evaluation 09/29/20    Authorization Type T[employer]/ [employer]    PT Start Time [id number]    PT Stop Time 0930    PT Time Calculation (min) 40 min    Activity Tolerance Patient tolerated treatment well    Behavior During Therapy [facility] for tasks assessed/performed             Past Medical History:  Diagnosis Date   Arthritis    Chicken pox    Diabetes mellitus (HCC)    Hyperlipidemia    Hypertension    Hyponatremia - HCTZ 07/15/2020   Osteopenia     Past Surgical History:  Procedure Laterality Date   CARDIAC PACEMAKER PLACEMENT     CESAREAN SECTION  1974, 1977, 1979   REDUCTION MAMMAPLASTY Bilateral 2009   TUBAL LIGATION      There were no vitals filed for this visit.   Subjective Assessment - 09/10/20 0851     Subjective I had really bad pain in my Rt foot after last session.  Soreness today in Rt hip ("butt cheek) and lower leg and calf.    Currently in Pain? Yes    Pain Score 5     Pain Location Hip    Pain Orientation Right    Pain Descriptors / Indicators Aching;Sore    Pain Type Chronic pain    Pain Radiating Towards Rt LE to foot and instep    Pain Onset More than a month ago    Pain Frequency Constant    Aggravating Factors  walking, standing, poor shoes    Pain Relieving Factors not moving    Multiple Pain Sites No               [facility] Adult PT Treatment/Exercise:  Therapeutic Exercise: - Stretching to calves bilateral  Slantboard x 1 min x 2 and wall each LE x 2  -Wall squat 1/2 x 10 slow -Seated calf, hamstring 30 sec  x 2 each side  -Figure 4 seated  2 x 30 sec -Bridge with band x 10 2 sets  -SLR x 10 each LE  -Core press into circle x 10 each UE  -Hamstring and ITB 30 sec x 3   Manual Therapy: - LAD x 5 Rt LE   PT Education - 09/10/20 0855     Education Details stretching vs strength    Person(s) Educated Patient    Methods Explanation;Handout    Comprehension Verbalized understanding;Returned demonstration              PT Short Term Goals - 09/10/20 0855       PT SHORT TERM GOAL #1   Title The patient will be independent in a basic HEP while out of town    Status Achieved      PT SHORT TERM GOAL #2   Title The patient will be independent in use of SEC for community ambulation to manage hip pain.    Status On-going               PT  Long Term Goals - 09/10/20 0916       PT LONG TERM GOAL #1   Title The patient will report improvement of walking distance to 4 blocks with pain under 4/10.    Baseline Patient walks only about 10-15 min and pain causes her to stop. She walks 2 blocks.  Does not really do this recently. Needs to hold to something in the store.    Status On-going      PT LONG TERM GOAL #2   Title The patient will be able to stand for 20-30 minutes with low back pain under 4/10    Baseline standing for home tasks is not as limited as walking    Status On-going      PT LONG TERM GOAL #3   Title The patient will present with improvement of core flexor strength to 4/5 for improvement of posture and walking distance.      PT LONG TERM GOAL #4   Title The patient will be able to squat to 1/2 depth for proper lifting mechanics.    Status Unable to assess                   Plan - 09/10/20 0901     Clinical Impression Statement Pt with increased distal symptoms following her last session. Given calf stretch today.  She cont to have pain with activity ,described more as soreness yet fairly independent of level of activity. No improvement seen at this  point.    PT Treatment/Interventions ADLs/Self Care Home Management;Aquatic Therapy;Electrical Stimulation;Stair training;Gait training;Functional mobility training;Therapeutic activities;Therapeutic exercise;Balance training;Neuromuscular re-education;Manual techniques;Patient/family education;Taping;Spinal Manipulations;Joint Manipulations;Moist Heat    PT Next Visit Plan Walk vs Nustep for warm up. Core and hip strength, calf stretch. Manual, tennis ball    PT Home Exercise Plan Access Code: [email or web address]    Consulted and Agree with Plan of Care Patient             Patient will benefit from skilled therapeutic intervention in order to improve the following deficits and impairments:  Abnormal gait, Difficulty walking, Pain, Decreased balance, Impaired flexibility, Improper body mechanics, Decreased strength, Postural dysfunction  Visit Diagnosis: Bilateral hip pain  Abnormal posture  Difficulty in walking, not elsewhere classified     Problem List Patient Active Problem List   Diagnosis Date Noted   Hyponatremia - HCTZ 07/15/2020   Spondylosis of lumbar region without myelopathy or radiculopathy 07/14/2020   Cardiac pacemaker in situ 03/19/2019   Encounter for screening colonoscopy 02/21/2017   CKD (chronic kidney disease) stage 3, GFR 30-59 ml/min (HCC) 10/04/2016   Obesity (BMI 30-39.9) 01/12/2016   Primary localized osteoarthrosis of left lower leg 07/16/2014   History of complete heart block 10/04/2012   Combined hyperlipidemia associated with type 2 diabetes mellitus (HCC) 11/04/2011   Controlled type 2 diabetes mellitus with stage 3 chronic kidney disease, without long-term current use of insulin (HCC) 11/04/2011   Age-related osteoporosis without fracture 06/01/2011   Vitamin D deficiency 04/16/2011   Benign essential hypertension 01/11/2011    Amy Bartlett, PT 09/10/2020, 9:32 AM  [facility] [address] [place], [place], [address] Phone: [phone]   Fax:  [phone]  Name: Amy Bartlett MRN: [id number] Date of Birth: July 02, 1950   [name], PT 09/10/20 9:32 AM Phone: [phone] Fax: [phone]

## 2020-09-12 ENCOUNTER — Other Ambulatory Visit: Payer: Self-pay

## 2020-09-12 ENCOUNTER — Encounter: Payer: Self-pay | Admitting: Physical Therapy

## 2020-09-12 ENCOUNTER — Ambulatory Visit: Payer: Medicare PPO | Admitting: Physical Therapy

## 2020-09-12 DIAGNOSIS — M25551 Pain in right hip: Secondary | ICD-10-CM

## 2020-09-12 DIAGNOSIS — R293 Abnormal posture: Secondary | ICD-10-CM

## 2020-09-12 DIAGNOSIS — M25552 Pain in left hip: Secondary | ICD-10-CM | POA: Diagnosis not present

## 2020-09-12 DIAGNOSIS — R262 Difficulty in walking, not elsewhere classified: Secondary | ICD-10-CM

## 2020-09-12 NOTE — Therapy (Addendum)
[facility] Outpatient Rehabilitation [facility] [address] [place], [place], [address] Phone: [phone]   Fax:  [phone]  Physical Therapy Treatment  Patient Details  Name: Amy Bartlett MRN: [id number] Date of Birth: 1950/07/12 Referring Provider (PT): Willow Ora, MD   Encounter Date: 09/12/2020   PT End of Session - 09/12/20 0943     Visit Number 7    Number of Visits 16    Date for PT Re-Evaluation 09/29/20    Authorization Type [employer]/ [employer]    Progress Note Due on Visit 10    PT Start Time 0940   10 minutes late   PT Stop Time 1013    PT Time Calculation (min) 33 min             Past Medical History:  Diagnosis Date   Arthritis    Chicken pox    Diabetes mellitus (HCC)    Hyperlipidemia    Hypertension    Hyponatremia - HCTZ 07/15/2020   Osteopenia     Past Surgical History:  Procedure Laterality Date   CARDIAC PACEMAKER PLACEMENT     CESAREAN SECTION  1974, 1977, 1979   REDUCTION MAMMAPLASTY Bilateral 2009   TUBAL LIGATION      There were no vitals filed for this visit.   Subjective Assessment - 09/12/20 0942     Subjective Pt reports less pain in her right hip and leg today except her medial foot is painful today.    Diagnostic tests CLINICAL DATA:  Left leg and back pain.     EXAM:  DG HIP (WITH OR WITHOUT PELVIS) 2V BILAT     COMPARISON:  None.     FINDINGS:  Enthesopathic changes along the iliac crests. The hips are normal in  appearance without significant degenerative change. No fracture or  dislocation. No other significant abnormalities.     IMPRESSION:  No acute abnormalities. No definite cause for left leg pain  identified.EXAM:  LUMBAR SPINE - COMPLETE 4+ VIEW     COMPARISON:  None.     FINDINGS:  Grade 1 anterolisthesis of L4 versus L5. No other malalignment. No  fractures. Multilevel degenerative disc disease with small anterior  osteophytes. Lower lumbar facet degenerative changes.     IMPRESSION:   Degenerative disc disease and lower lumbar facet degenerative  changes.     Grade 1 anterolisthesis of L4 versus L5.    Patient Stated Goals To be able to walk over 2 blocks.    Currently in Pain? Yes    Pain Score 4     Pain Location Foot    Pain Orientation Right    Pain Descriptors / Indicators Aching    Pain Type Chronic pain    Pain Radiating Towards instep    Aggravating Factors  walking , also can feel it in sitting    Pain Relieving Factors stretching                               [facility] Adult PT Treatment/Exercise - 09/12/20 0001       Transfers   Five time sit to stand comments  13.5 without UE      Lumbar Exercises: Stretches   Active Hamstring Stretch 3 reps;30 seconds    Active Hamstring Stretch Limitations supine with strap    ITB Stretch 2 reps;30 seconds    ITB Stretch Limitations supine with strap on right    Figure  4 Stretch 30 seconds;2 reps;Supine;With overpressure    Figure 4 Stretch Limitations push and pull bilateral    Gastroc Stretch Limitations runners stretch 2 x each      Lumbar Exercises: Seated   Sit to Stand 5 reps   2 sets     Lumbar Exercises: Supine   Pelvic Tilt 10 reps    Pelvic Tilt Limitations 5 sec holds, min cues    Clam 20 reps    Clam Limitations Blue band    Bridge 10 reps    Bridge Limitations with initial PPT    Bridge with clamshell 10 reps    Bridge with Ball Squeeze Limitations blue band                       PT Short Term Goals - 09/12/20 0944       PT SHORT TERM GOAL #1   Title The patient will be independent in a basic HEP while out of town    Baseline done "a little bit"    Time 2    Period Weeks    Status Achieved    Target Date 08/18/20      PT SHORT TERM GOAL #2   Title The patient will be independent in use of SEC for community ambulation to manage hip pain.    Baseline does not use this or own a cane; was instructed how to use in clininc - doesnt think she needs it.     Time 2    Period Weeks    Status On-going    Target Date 08/18/20               PT Long Term Goals - 09/10/20 0916       PT LONG TERM GOAL #1   Title The patient will report improvement of walking distance to 4 blocks with pain under 4/10.    Baseline Patient walks only about 10-15 min and pain causes her to stop. She walks 2 blocks.  Does not really do this recently. Needs to hold to something in the store.    Status On-going      PT LONG TERM GOAL #2   Title The patient will be able to stand for 20-30 minutes with low back pain under 4/10    Baseline standing for home tasks is not as limited as walking    Status On-going      PT LONG TERM GOAL #3   Title The patient will present with improvement of core flexor strength to 4/5 for improvement of posture and walking distance.      PT LONG TERM GOAL #4   Title The patient will be able to squat to 1/2 depth for proper lifting mechanics.    Status Unable to assess                   Plan - 09/12/20 0958     Clinical Impression Statement Pt reports she feels stretches from last session were beneficial. She notes decreased pain today in hip and leg but does have medial foot pain today that is present in standing and rest positions. Her 5 x STS is 13 ses from mat. Worked on controlled descent and she was able to retrun demonstrate with cues. Continued with core and hip stabilization with intermittent c/o left lateral knee soreness. Completed LE and hip stretches and pt reported feeling soreness in right buttock at end of session. Pt was given tennis  ball today for self massage- she could not find hers.    PT Next Visit Plan Walk vs Nustep for warm up. Core and hip strength, calf stretch. Manual, tennis ball    PT Home Exercise Plan Access Code: [email or web address]             Patient will benefit from skilled therapeutic intervention in order to improve the following deficits and impairments:  Abnormal gait, Difficulty walking,  Pain, Decreased balance, Impaired flexibility, Improper body mechanics, Decreased strength, Postural dysfunction  Visit Diagnosis: Bilateral hip pain  Abnormal posture  Difficulty in walking, not elsewhere classified     Problem List Patient Active Problem List   Diagnosis Date Noted   Hyponatremia - HCTZ 07/15/2020   Spondylosis of lumbar region without myelopathy or radiculopathy 07/14/2020   Cardiac pacemaker in situ 03/19/2019   Encounter for screening colonoscopy 02/21/2017   CKD (chronic kidney disease) stage 3, GFR 30-59 ml/min (HCC) 10/04/2016   Obesity (BMI 30-39.9) 01/12/2016   Primary localized osteoarthrosis of left lower leg 07/16/2014   History of complete heart block 10/04/2012   Combined hyperlipidemia associated with type 2 diabetes mellitus (HCC) 11/04/2011   Controlled type 2 diabetes mellitus with stage 3 chronic kidney disease, without long-term current use of insulin (HCC) 11/04/2011   Age-related osteoporosis without fracture 06/01/2011   Vitamin D deficiency 04/16/2011   Benign essential hypertension 01/11/2011    Sherrie Mustache, PTA 09/12/2020, 10:13 AM  [facility] Outpatient Rehabilitation [facility] [address] [place], [place], [address] Phone: [phone]   Fax:  [phone]  Name: Kenlie Seki MRN: [id number] Date of Birth: 11/09/1950

## 2020-09-16 ENCOUNTER — Ambulatory Visit: Payer: Medicare PPO | Admitting: Physical Therapy

## 2020-09-16 ENCOUNTER — Encounter: Payer: Self-pay | Admitting: Physical Therapy

## 2020-09-16 ENCOUNTER — Other Ambulatory Visit: Payer: Self-pay

## 2020-09-16 DIAGNOSIS — R293 Abnormal posture: Secondary | ICD-10-CM | POA: Diagnosis not present

## 2020-09-16 DIAGNOSIS — R262 Difficulty in walking, not elsewhere classified: Secondary | ICD-10-CM

## 2020-09-16 DIAGNOSIS — M25552 Pain in left hip: Secondary | ICD-10-CM | POA: Diagnosis not present

## 2020-09-16 DIAGNOSIS — M25551 Pain in right hip: Secondary | ICD-10-CM | POA: Diagnosis not present

## 2020-09-16 NOTE — Therapy (Signed)
[facility] [place], [place], [address] Phone: [phone]   Fax:  [phone]  Physical Therapy Treatment  Patient Details  Name: Amy Bartlett MRN: [id number] Date of Birth: 06-11-1950 Referring Provider (PT): Leamon Arnt, MD   Encounter Date: 09/16/2020   PT End of Session - 09/16/20 1337     Visit Number 8    Number of Visits 16    Date for PT Re-Evaluation 09/29/20    Authorization Type T[employer]/ [employer]    Progress Note Due on Visit 10    PT Start Time 1323    PT Stop Time 1401    PT Time Calculation (min) 38 min             Past Medical History:  Diagnosis Date   Arthritis    Chicken pox    Diabetes mellitus ([place])    Hyperlipidemia    Hypertension    Hyponatremia - HCTZ 07/15/2020   Osteopenia     Past Surgical History:  Procedure Laterality Date   [place] Bilateral 2009   TUBAL LIGATION      There were no vitals filed for this visit.   Subjective Assessment - 09/16/20 1324     Subjective Pt reports no pain on arrival and she has been compliant with HEP.    Currently in Pain? No/denies                [facility] PT Assessment - 09/16/20 0001       Strength   Lumbar Flexion 4/5   can lift head and shoulder blades in hooklying     Ambulation/Gait   Gait Comments 2 min walk test 415 feet      6 minute walk test results    Aerobic Endurance Distance Walked 1236                           [facility] Adult PT Treatment/Exercise - 09/16/20 0001       Lumbar Exercises: Stretches   Pelvic Tilt 15 reps;5 seconds    Figure 4 Stretch 30 seconds;2 reps;Supine;With overpressure    Figure 4 Stretch Limitations push and pull bilateral    Gastroc Stretch Limitations runners stretch 2 x each      Lumbar Exercises: Seated   Sit to Stand 10 reps    Sit to Stand Limitations x 5 with  5# KB lift from 4 inch step- cues for hip hinge/weight shift forward      Lumbar Exercises: Supine   Pelvic Tilt 10 reps    Pelvic Tilt Limitations 5 sec holds, min cues    Clam 20 reps    Clam Limitations Blue band    Bridge 10 reps    Bridge Limitations with initial PPT    Bridge with clamshell 10 reps    Bridge with Ball Squeeze Limitations blue band                       PT Short Term Goals - 09/16/20 1349       PT SHORT TERM GOAL #1   Title The patient will be independent in a basic HEP while out of town    Time 2    Period Weeks    Status Achieved    Target Date 08/18/20      PT  SHORT TERM GOAL #2   Title The patient will be independent in use of [place] for community ambulation to manage hip pain.    Baseline does not use this or own a cane; was instructed how to use in clininc - doesnt think she needs it.    Time 2    Period Weeks    Status Deferred    Target Date 08/18/20               PT Long Term Goals - 09/16/20 1326       PT LONG TERM GOAL #1   Title The patient will report improvement of walking distance to 4 blocks with pain under 4/10.    Baseline is not currenty walking, used to walk in the mall; has pain when walking in the store- needs to have a cart. Pain starts in buttock and runs down leg.    Time 8    Period Weeks    Status On-going      PT LONG TERM GOAL #2   Title The patient will be able to stand for 20-30 minutes with low back pain under 4/10    Baseline standing for home tasks is not as limited as walking- can stand as long as needed for home tasks    Time 8    Period Weeks    Status Achieved      PT LONG TERM GOAL #3   Title The patient will present with improvement of core flexor strength to 4/5 for improvement of posture and walking distance.    Baseline 4/5    Time 8    Period Weeks    Status Achieved      PT LONG TERM GOAL #4   Title The patient will be able to squat to 1/2 depth for proper lifting mechanics.     Baseline able to perform squat to lift 5# KB from 4 inch step    Time 8    Period Weeks    Status On-going                   Plan - 09/16/20 1351     Clinical Impression Statement Pt reports no pain on arrival and believes the stretches are helping alot. She reports no issue with standing tasks but is limited with walking. She requires a cart in store to hold onto for shopping. In clinic she is able to complete 6MWT at 1236 feet without pain. Her core flexor strength has improved. She has met LTG# 2,3. Worked on Economist with sit-stands with 5# KB to progress toward LTG# 4.    PT Next Visit Plan Walk vs Nustep for warm up. Core and hip strength, calf stretch. Manual, tennis ball; continue chair squat with weights    PT Home Exercise Plan Access Code: [email or web address]    Consulted and Agree with Plan of Care Patient             Patient will benefit from skilled therapeutic intervention in order to improve the following deficits and impairments:  Abnormal gait, Difficulty walking, Pain, Decreased balance, Impaired flexibility, Improper body mechanics, Decreased strength, Postural dysfunction  Visit Diagnosis: Bilateral hip pain  Difficulty in walking, not elsewhere classified  Abnormal posture     Problem List Patient Active Problem List   Diagnosis Date Noted   Hyponatremia - HCTZ 07/15/2020   Spondylosis of lumbar region without myelopathy or radiculopathy 07/14/2020   Cardiac pacemaker in situ 03/19/2019   Encounter  for screening colonoscopy 02/21/2017   CKD (chronic kidney disease) stage 3, GFR 30-59 ml/min (HCC) 10/04/2016   Obesity (BMI 30-39.9) 01/12/2016   Primary localized osteoarthrosis of left lower leg 07/16/2014   History of complete heart block 10/04/2012   Combined hyperlipidemia associated with type 2 diabetes mellitus ([place]) 11/04/2011   Controlled type 2 diabetes mellitus with stage 3 chronic kidney disease, without long-term current use of insulin  ([place]) 11/04/2011   Age-related osteoporosis without fracture 06/01/2011   Vitamin D deficiency 04/16/2011   Benign essential hypertension 01/11/2011    Dorene Ar, PTA 09/16/2020, 2:01 PM  [facility] [address] [place], [place], [address] Phone: [phone]   Fax:  [phone]  Name: Amy Bartlett MRN: [id number] Date of Birth: Mar 21, 1950

## 2020-09-17 ENCOUNTER — Other Ambulatory Visit: Payer: Self-pay | Admitting: Family Medicine

## 2020-09-18 ENCOUNTER — Encounter: Payer: Self-pay | Admitting: Family Medicine

## 2020-09-18 ENCOUNTER — Other Ambulatory Visit: Payer: Self-pay

## 2020-09-18 ENCOUNTER — Ambulatory Visit
Admission: RE | Admit: 2020-09-18 | Discharge: 2020-09-18 | Disposition: A | Discharge status: Home / Self Care | Payer: Medicare PPO | Source: Ambulatory Visit | Attending: Family Medicine | Admitting: Family Medicine

## 2020-09-18 DIAGNOSIS — M818 Other osteoporosis without current pathological fracture: Secondary | ICD-10-CM

## 2020-09-18 DIAGNOSIS — Z13820 Encounter for screening for osteoporosis: Secondary | ICD-10-CM | POA: Insufficient documentation

## 2020-09-18 DIAGNOSIS — Z780 Asymptomatic menopausal state: Secondary | ICD-10-CM | POA: Diagnosis not present

## 2020-09-18 DIAGNOSIS — M8589 Other specified disorders of bone density and structure, multiple sites: Secondary | ICD-10-CM | POA: Diagnosis not present

## 2020-09-23 ENCOUNTER — Ambulatory Visit: Payer: Medicare PPO | Admitting: Physical Therapy

## 2020-09-24 ENCOUNTER — Other Ambulatory Visit: Payer: Self-pay | Admitting: Family Medicine

## 2020-09-25 ENCOUNTER — Ambulatory Visit: Payer: Medicare PPO | Admitting: Physical Therapy

## 2020-09-25 ENCOUNTER — Encounter: Payer: Self-pay | Admitting: Physical Therapy

## 2020-09-25 ENCOUNTER — Other Ambulatory Visit: Payer: Self-pay

## 2020-09-25 DIAGNOSIS — R293 Abnormal posture: Secondary | ICD-10-CM | POA: Diagnosis not present

## 2020-09-25 DIAGNOSIS — M25552 Pain in left hip: Secondary | ICD-10-CM | POA: Diagnosis not present

## 2020-09-25 DIAGNOSIS — R262 Difficulty in walking, not elsewhere classified: Secondary | ICD-10-CM | POA: Diagnosis not present

## 2020-09-25 DIAGNOSIS — M25551 Pain in right hip: Secondary | ICD-10-CM

## 2020-09-25 NOTE — Therapy (Signed)
[facility] [address] [place], [place], [address] Phone: [phone]   Fax:  [phone]  Physical Therapy Treatment  Patient Details  Name: Amy Bartlett MRN: [id number] Date of Birth: [birth year] Referring Provider (PT): Willow Ora, MD   Encounter Date: 09/25/2020   PT End of Session - 09/25/20 1156     Visit Number 9    Number of Visits 16    Date for PT Re-Evaluation 09/29/20    Authorization Type [employer]/ [employer]    Progress Note Due on Visit 10    PT Start Time 1153    PT Stop Time 1231    PT Time Calculation (min) 38 min    Activity Tolerance Patient tolerated treatment well    Behavior During Therapy [facility] for tasks assessed/performed             Past Medical History:  Diagnosis Date   Arthritis    Chicken pox    Diabetes mellitus (HCC)    Hyperlipidemia    Hypertension    Hyponatremia - HCTZ 07/15/2020   Osteopenia     Past Surgical History:  Procedure Laterality Date   CARDIAC PACEMAKER PLACEMENT     CESAREAN SECTION  1974, 1977, 1979   REDUCTION MAMMAPLASTY Bilateral 2009   TUBAL LIGATION      There were no vitals filed for this visit.   Subjective Assessment - 09/25/20 1155     Subjective I am not feeling pain right now. I do have have pain every day with sitting, getting up, walking. Sometimes I dont have any pain. I never know when it will ache. Pain reaches 7/10 with walking.    Currently in Pain? No/denies                               [facility] Adult PT Treatment/Exercise - 09/25/20 0001       Lumbar Exercises: Stretches   Lower Trunk Rotation 10 seconds    Lower Trunk Rotation Limitations 10 reps    Standing Extension 10 reps;5 seconds    Figure 4 Stretch 30 seconds;2 reps;Supine;With overpressure    Figure 4 Stretch Limitations push and pull bilateral    Gastroc Stretch Limitations runners stretch 2 x each    Other Lumbar Stretch Exercise seated  stool rollouts for lumbar flexion stretch      Lumbar Exercises: Seated   Sit to Stand 10 reps    Sit to Stand Limitations AROM  x 10, with 10# KB x 10 (held at chest )    Other Seated Lumbar Exercises Deadlift AROM x 10 touching KB on 4 inch step, then lifting 10# KB from 4inch step- min cues- began to have low back pain with last reps      Lumbar Exercises: Supine   Bridge 10 reps    Bridge Limitations with initial PPT    Bridge with clamshell 10 reps    Bridge with [employer] Limitations blue band    Other Supine Lumbar Exercises Seated neural tensioner                       PT Short Term Goals - 09/16/20 1349       PT SHORT TERM GOAL #1   Title The patient will be independent in a basic HEP while out of town    Time 2    Period Weeks    Status  Achieved    Target Date 08/18/20      PT SHORT TERM GOAL #2   Title The patient will be independent in use of [place] for community ambulation to manage hip pain.    Baseline does not use this or own a cane; was instructed how to use in clininc - doesnt think she needs it.    Time 2    Period Weeks    Status Deferred    Target Date 08/18/20               PT Long Term Goals - 09/16/20 1326       PT LONG TERM GOAL #1   Title The patient will report improvement of walking distance to 4 blocks with pain under 4/10.    Baseline is not currenty walking, used to walk in the mall; has pain when walking in the store- needs to have a cart. Pain starts in buttock and runs down leg.    Time 8    Period Weeks    Status On-going      PT LONG TERM GOAL #2   Title The patient will be able to stand for 20-30 minutes with low back pain under 4/10    Baseline standing for home tasks is not as limited as walking- can stand as long as needed for home tasks    Time 8    Period Weeks    Status Achieved      PT LONG TERM GOAL #3   Title The patient will present with improvement of core flexor strength to 4/5 for improvement of  posture and walking distance.    Baseline 4/5    Time 8    Period Weeks    Status Achieved      PT LONG TERM GOAL #4   Title The patient will be able to squat to 1/2 depth for proper lifting mechanics.    Baseline able to perform squat to lift 5# KB from 4 inch step    Time 8    Period Weeks    Status On-going                   Plan - 09/25/20 1214     Clinical Impression Statement Pt reports no pain on arrival. She walked alot this weekend and the hip was painful. She reports 25% overall imrprovement in pain. She is limited to 10 minutes of walking prior to increased pain in right hip to lateral calf. Continued with lifting and squatting. Deadlift was painful in lower after multiple reps which was relieved by stretching.    PT Treatment/Interventions ADLs/Self Care Home Management;Aquatic Therapy;Electrical Stimulation;Stair training;Gait training;Functional mobility training;Therapeutic activities;Therapeutic exercise;Balance training;Neuromuscular re-education;Manual techniques;Patient/family education;Taping;Spinal Manipulations;Joint Manipulations;Moist Heat    PT Next Visit Plan Walk vs Nustep for warm up. Core and hip strength, calf stretch. Manual, tennis ball; continue chair squat with weights    PT Home Exercise Plan Access Code: [email or web address]    Consulted and Agree with Plan of Care Patient             Patient will benefit from skilled therapeutic intervention in order to improve the following deficits and impairments:  Abnormal gait, Difficulty walking, Pain, Decreased balance, Impaired flexibility, Improper body mechanics, Decreased strength, Postural dysfunction  Visit Diagnosis: Abnormal posture  Difficulty in walking, not elsewhere classified  Bilateral hip pain     Problem List Patient Active Problem List   Diagnosis Date Noted   Screening for osteoporosis  09/18/2020   Hyponatremia - HCTZ 07/15/2020   Spondylosis of lumbar region without myelopathy  or radiculopathy 07/14/2020   Cardiac pacemaker in situ 03/19/2019   Encounter for screening colonoscopy 02/21/2017   CKD (chronic kidney disease) stage 3, GFR 30-59 ml/min (HCC) 10/04/2016   Obesity (BMI 30-39.9) 01/12/2016   Primary localized osteoarthrosis of left lower leg 07/16/2014   History of complete heart block 10/04/2012   Combined hyperlipidemia associated with type 2 diabetes mellitus (HCC) 11/04/2011   Controlled type 2 diabetes mellitus with stage 3 chronic kidney disease, without long-term current use of insulin (HCC) 11/04/2011   Age-related osteoporosis without fracture 06/01/2011   Vitamin D deficiency 04/16/2011   Benign essential hypertension 01/11/2011    Sherrie Mustache, PTA 09/25/2020, 12:47 PM  [facility] [address] [place], [place], [address] Phone: [phone]   Fax:  [phone]  Name: Amy Bartlett MRN: [id number] Date of Birth: 01/14/1950

## 2020-09-30 ENCOUNTER — Other Ambulatory Visit: Payer: Self-pay

## 2020-09-30 ENCOUNTER — Encounter: Payer: Self-pay | Admitting: Physical Therapy

## 2020-09-30 ENCOUNTER — Ambulatory Visit: Payer: Medicare PPO | Admitting: Physical Therapy

## 2020-09-30 DIAGNOSIS — M25552 Pain in left hip: Secondary | ICD-10-CM | POA: Diagnosis not present

## 2020-09-30 DIAGNOSIS — R293 Abnormal posture: Secondary | ICD-10-CM | POA: Diagnosis not present

## 2020-09-30 DIAGNOSIS — M25551 Pain in right hip: Secondary | ICD-10-CM

## 2020-09-30 DIAGNOSIS — R262 Difficulty in walking, not elsewhere classified: Secondary | ICD-10-CM | POA: Diagnosis not present

## 2020-09-30 NOTE — Therapy (Addendum)
[place], [place], [address] Phone: [phone]   Fax:  [phone]  Physical Therapy Treatment/Discharge  Patient Details  Name: Amy Bartlett MRN: [id number] Date of Birth: 05-May-1950 Referring Provider (PT): Leamon Arnt, MD   Encounter Date: 09/30/2020   PT End of Session - 09/30/20 1151     Visit Number 10    Number of Visits 16    Date for PT Re-Evaluation 09/29/20    Authorization Type [employer]/ [employer]    PT Start Time 2119    PT Stop Time 1230    PT Time Calculation (min) 41 min             Past Medical History:  Diagnosis Date   Arthritis    Chicken pox    Diabetes mellitus ([place])    Hyperlipidemia    Hypertension    Hyponatremia - HCTZ 07/15/2020   Osteopenia     Past Surgical History:  Procedure Laterality Date   [place] Bilateral 2009   TUBAL LIGATION      There were no vitals filed for this visit.       [facility] PT Assessment - 09/30/20 0001       Strength   Lumbar Flexion 4/5   can lift head and shoulder blades in hooklying     Transfers   Five time sit to stand comments  13.5 without UE      6 minute walk test results    Aerobic Endurance Distance Walked 1236                           [facility] Adult PT Treatment/Exercise - 09/30/20 0001       Lumbar Exercises: Stretches   Active Hamstring Stretch 3 reps;30 seconds    Active Hamstring Stretch Limitations supine with strap    Lower Trunk Rotation 10 seconds    Lower Trunk Rotation Limitations 10 reps    Pelvic Tilt 15 reps;5 seconds    Figure 4 Stretch 30 seconds;2 reps;Supine;With overpressure    Figure 4 Stretch Limitations push and pull bilateral    Gastroc Stretch Limitations runners stretch 2 x each      Lumbar Exercises: Seated   Sit to Stand 10 reps    Other Seated Lumbar Exercises 10#  chair squat with KB x 8      Lumbar Exercises: Supine   Pelvic Tilt 10 reps    Pelvic Tilt Limitations 5 sec holds, min cues    Clam 20 reps    Clam Limitations Blue band    Bridge 10 reps    Bridge Limitations with initial PPT    Bridge with clamshell 10 reps    Bridge with Ball Squeeze Limitations blue band                       PT Short Term Goals - 09/16/20 1349       PT SHORT TERM GOAL #1   Title The patient will be independent in a basic HEP while out of town    Time 2    Period Weeks    Status Achieved    Target Date 08/18/20      PT SHORT TERM GOAL #2   Title The patient will be independent in use of [place] for community ambulation to manage  hip pain.    Baseline does not use this or own a cane; was instructed how to use in clininc - doesnt think she needs it.    Time 2    Period Weeks    Status Deferred    Target Date 08/18/20               PT Long Term Goals - 09/30/20 1214       PT LONG TERM GOAL #1   Title The patient will report improvement of walking distance to 4 blocks with pain under 4/10.    Baseline is not currenty walking, used to walk in the mall; has pain when walking in the store- needs to have a cart. Pain starts in buttock and runs down leg; max of 10 minutes without cart.    Time 8    Period Weeks    Status Not Met      PT LONG TERM GOAL #2   Title The patient will be able to stand for 20-30 minutes with low back pain under 4/10    Baseline standing for home tasks is not as limited as walking- can stand as long as needed for home tasks; restbreaks as needed    Time 8    Period Weeks    Status Achieved      PT LONG TERM GOAL #3   Title The patient will present with improvement of core flexor strength to 4/5 for improvement of posture and walking distance.    Baseline 4/5    Time 8    Period Weeks    Status Achieved      PT LONG TERM GOAL #4   Title The patient will be able to squat to 1/2 depth for proper lifting  mechanics.    Baseline can lift and demonstrate proper body mechanice to 1/2 depth    Time 8    Period Weeks    Status Achieved                   Plan - 09/30/20 1155     Clinical Impression Statement Pt reports no pain  on arrival. She reports overall 25% improvement in pan. She is limited to 10 minutes of walking prior to increased pain. Her tolerance to household chores has improved and her core strength has improved. She demonstrates proper body mechanics with lifting and can perform squat as needed. She has met LTG #2,#3, #4. She will follow up with MD regarding continued pain with prolonged walking. She is agreeable to discharge to HEP today.    PT Treatment/Interventions ADLs/Self Care Home Management;Aquatic Therapy;Electrical Stimulation;Stair training;Gait training;Functional mobility training;Therapeutic activities;Therapeutic exercise;Balance training;Neuromuscular re-education;Manual techniques;Patient/family education;Taping;Spinal Manipulations;Joint Manipulations;Moist Heat    PT Next Visit Plan discharge today    PT Home Exercise Plan Access Code: [email or web address]    Consulted and Agree with Plan of Care Patient             Patient will benefit from skilled therapeutic intervention in order to improve the following deficits and impairments:  Abnormal gait, Difficulty walking, Pain, Decreased balance, Impaired flexibility, Improper body mechanics, Decreased strength, Postural dysfunction  Visit Diagnosis: Abnormal posture  Difficulty in walking, not elsewhere classified  Bilateral hip pain     Problem List Patient Active Problem List   Diagnosis Date Noted   Screening for osteoporosis 09/18/2020   Hyponatremia - HCTZ 07/15/2020   Spondylosis of lumbar region without myelopathy or radiculopathy 07/14/2020   Cardiac pacemaker in situ 03/19/2019  Encounter for screening colonoscopy 02/21/2017   CKD (chronic kidney disease) stage 3, GFR 30-59 ml/min (HCC)  10/04/2016   Obesity (BMI 30-39.9) 01/12/2016   Primary localized osteoarthrosis of left lower leg 07/16/2014   History of complete heart block 10/04/2012   Combined hyperlipidemia associated with type 2 diabetes mellitus ([place]) 11/04/2011   Controlled type 2 diabetes mellitus with stage 3 chronic kidney disease, without long-term current use of insulin ([place]) 11/04/2011   Age-related osteoporosis without fracture 06/01/2011   Vitamin D deficiency 04/16/2011   Benign essential hypertension 01/11/2011    Dorene Ar, PTA 09/30/2020, 12:30 PM  [facility] [address] [place], [place], [address] Phone: [phone]   Fax:  [phone]  Name: Amy Bartlett MRN: [id number] Date of Birth: July 05, 1950   PHYSICAL THERAPY DISCHARGE SUMMARY  Visits from Start of Care: 10  Current functional level related to goals / functional outcomes: See above    Remaining deficits: None limiting function   Education / Equipment: HEP, activity   Patient agrees to discharge. Patient goals were met. Patient is being discharged due to being pleased with the current functional level.   Raeford Razor, PT 10/01/20 7:49 AM Phone: [phone] Fax: [phone]

## 2020-10-14 DIAGNOSIS — I442 Atrioventricular block, complete: Secondary | ICD-10-CM | POA: Diagnosis not present

## 2020-10-17 ENCOUNTER — Other Ambulatory Visit: Payer: Self-pay

## 2020-10-17 ENCOUNTER — Ambulatory Visit (INDEPENDENT_AMBULATORY_CARE_PROVIDER_SITE_OTHER): Payer: Medicare PPO

## 2020-10-17 VITALS — BP 110/78 | HR 90 | Temp 97.7°F | Wt 200.0 lb

## 2020-10-17 DIAGNOSIS — Z23 Encounter for immunization: Secondary | ICD-10-CM | POA: Diagnosis not present

## 2020-10-17 DIAGNOSIS — Z0000 Encounter for general adult medical examination without abnormal findings: Secondary | ICD-10-CM | POA: Diagnosis not present

## 2020-10-17 NOTE — Progress Notes (Signed)
Subjective:   Amy Bartlett is a 70 y.o. female who presents for Medicare Annual (Subsequent) preventive examination.  Review of Systems     Cardiac Risk Factors include: advanced age (>21mn, >>85women);diabetes mellitus;hypertension;dyslipidemia;obesity (BMI >30kg/m2)     Objective:    Today's Vitals   10/17/20 1014  BP: 110/78  Pulse: 90  Temp: 97.7 F (36.5 C)  SpO2: 94%  Weight: 200 lb (90.7 kg)  PainSc: 3    Body mass index is 35.43 kg/m.  Advanced Directives 10/17/2020 08/04/2020 10/04/2018  Does Patient Have a Medical Advance Directive? No No No  Would patient like information on creating a medical advance directive? Yes (MAU/Ambulatory/Procedural Areas - Information given) Yes (MAU/Ambulatory/Procedural Areas - Information given) Yes (MAU/Ambulatory/Procedural Areas - Information given)    Current Medications (verified) Outpatient Encounter Medications as of 10/17/2020  Medication Sig   Accu-Chek Softclix Lancets lancets Use as instructed   Alcohol Swabs (B-D SINGLE USE SWABS REGULAR) PADS 1 each by Other route as directed.   Alcohol Swabs PADS Use as directed   alendronate (FOSAMAX) 70 MG tablet TAKE 1 TABLET BY MOUTH EVERY 7 DAYS WITH A FULL GLASS OF WATER ON AN EMPTY STOMACH   aspirin 81 MG tablet Take by mouth.   atorvastatin (LIPITOR) 40 MG tablet Take 1 tablet (40 mg total) by mouth daily.   Blood Glucose Calibration (ACCU-CHEK GUIDE CONTROL) LIQD 1 each by In Vitro route as directed. ACCU-CHEK L1-L2 CTRL SOL   Blood Glucose Monitoring Suppl (ACCU-CHEK GUIDE) w/Device KIT 1 each by Does not apply route daily.   Cholecalciferol 125 MCG (5000 UT) TABS Take by mouth.   diclofenac (VOLTAREN) 75 MG EC tablet Take 1 tablet (75 mg total) by mouth 2 (two) times daily as needed.   glucose blood test strip Use as instructed   JARDIANCE 25 MG TABS tablet TAKE ONE TABLET BY MOUTH DAILY WITH BREAKFAST   lisinopril-hydrochlorothiazide (ZESTORETIC) 20-25 MG  tablet TAKE ONE TABLET BY MOUTH ONE TIME DAILY   metFORMIN (GLUCOPHAGE-XR) 750 MG 24 hr tablet TAKE TWO TABLETS BY MOUTH DAILY WITH BREAKFAST   TRULICITY 03.33MLK/5.6YBSOPN Inject 0.75 mg into the skin once a week.   No facility-administered encounter medications on file as of 10/17/2020.    Allergies (verified) Patient has no known allergies.   History: Past Medical History:  Diagnosis Date   Arthritis    Chicken pox    Diabetes mellitus ([place]    Hyperlipidemia    Hypertension    Hyponatremia - HCTZ 07/15/2020   Osteopenia    Past Surgical History:  Procedure Laterality Date   CARDIAC PACEMAKER PLACEMENT     CESAREAN SECTION  1974, 1977, 1979   REDUCTION MAMMAPLASTY Bilateral 2009   TUBAL LIGATION     Family History  Problem Relation Age of Onset   Glaucoma Mother    Heart disease Mother        Had Pacemaker, Died on CHF    Hypertension Mother    Osteoporosis Mother    Pneumonia Father    Arthritis Father    Diabetes Brother    Stroke Brother    Healthy Daughter    Healthy Daughter    Social History   Socioeconomic History   Marital status: Married    Spouse name: Not on file   Number of children: Not on file   Years of education: Not on file   Highest education level: Not on file  Occupational History   Not on  file  Tobacco Use   Smoking status: Never   Smokeless tobacco: Never  Vaping Use   Vaping Use: Never used  Substance and Sexual Activity   Alcohol use: Never   Drug use: Never   Sexual activity: Not Currently  Other Topics Concern   Not on file  Social History Narrative   Has 3 children- 2 living in [place] and 1 in [place] Strain: Low Risk    Difficulty of Paying Living Expenses: Not hard at all  Food Insecurity: No Food Insecurity   Worried About Charity fundraiser in the Last Year: Never true   Arboriculturist in the Last Year: Never true  Transportation Needs: No  Transportation Needs   Lack of Transportation (Medical): No   Lack of Transportation (Non-Medical): No  Physical Activity: Inactive   Days of Exercise per Week: 0 days   Minutes of Exercise per Session: 0 min  Stress: No Stress Concern Present   Feeling of Stress : Not at all  Social Connections: Moderately Integrated   Frequency of Communication with Friends and Family: More than three times a week   Frequency of Social Gatherings with Friends and Family: More than three times a week   Attends Religious Services: More than 4 times per year   Active Member of [employer] or Organizations: No   Attends Music therapist: Never   Marital Status: Married    Tobacco Counseling Counseling given: Not Answered   Clinical Intake:  Pre-visit preparation completed: Yes  Pain : 0-10 Pain Score: 3  Pain Type: Chronic pain Pain Location: Leg Pain Orientation: Right Pain Descriptors / Indicators: Aching Pain Onset: More than a month ago Pain Frequency: Intermittent     BMI - recorded: 35.43 Nutritional Risks: None Diabetes: Yes CBG done?: No Did pt. bring in CBG monitor from home?: No  How often do you need to have someone help you when you read instructions, pamphlets, or other written materials from your doctor or pharmacy?: 1 - Never  Diabetic?Nutrition Risk Assessment:  Has the patient had any N/V/D within the last 2 months?  No  Does the patient have any non-healing wounds?  No  Has the patient had any unintentional weight loss or weight gain?  No   Diabetes:  Is the patient diabetic?  Yes  If diabetic, was a CBG obtained today?  No  Did the patient bring in their glucometer from home?  No  How often do you monitor your CBG's? As needed.   Financial Strains and Diabetes Management:  Are you having any financial strains with the device, your supplies or your medication? No .  Does the patient want to be seen by Chronic Care Management for management of their  diabetes?  No  Would the patient like to be referred to a Nutritionist or for Diabetic Management?  No   Diabetic Exams:  Diabetic Eye Exam: Completed 02/28/20 Diabetic Foot Exam: Completed 01/02/20   Interpreter Needed?: No  Information entered by :: Charlott Rakes, LPN   Activities of Daily Living In your present state of health, do you have any difficulty performing the following activities: 10/17/2020  Hearing? N  Vision? N  Difficulty concentrating or making decisions? N  Walking or climbing stairs? N  Comment discomfort  Dressing or bathing? N  Doing errands, shopping? N  Preparing Food and eating ? N  Using the Toilet? N  In  the past six months, have you accidently leaked urine? Y  Comment at times  Do you have problems with loss of bowel control? N  Managing your Medications? N  Managing your Finances? N  Housekeeping or managing your Housekeeping? N  Some recent data might be hidden    Patient Care Team: Leamon Arnt, MD as PCP - General (Family Medicine) Clent Jacks, MD as Consulting Physician (Ophthalmology) Felicity Pellegrini Tonna Corner, MD as Referring Physician (Nephrology) Georg Ruddle Ashok Cordia, MD as Referring Physician (Cardiology) Dr. Gerlene Burdock (Dentistry)  Indicate any recent Medical Services you may have received from other than [facility] providers in the past year (date may be approximate).     Assessment:   This is a routine wellness examination for Williams.  Hearing/Vision screen Hearing Screening - Comments:: Pt denies any hearing  Vision Screening - Comments:: Pt follows up with Dr Katy Fitch for annual eye exams   Dietary issues and exercise activities discussed: Current Exercise Habits: The patient does not participate in regular exercise at present   Goals Addressed             This Visit's Progress    Patient Stated       Lose weight        Depression Screen PHQ 2/9 Scores 10/17/2020 01/02/2020 10/04/2018 05/25/2018 06/28/2017  PHQ - 2 Score 0  0 0 0 0    Fall Risk Fall Risk  10/17/2020 04/23/2019 10/04/2018 05/25/2018 06/28/2017  Falls in the past year? 0 0 0 0 Yes  Number falls in past yr: 0 0 0 0 1  Comment - - - - December 2018  Injury with Fall? 0 0 0 0 Yes  Comment - - - - Hurt left shoulder, slipped in snow  Risk for fall due to : Impaired vision - - - -  Follow up Falls prevention discussed - Falls evaluation completed;Education provided;Falls prevention discussed Falls evaluation completed -    FALL RISK PREVENTION PERTAINING TO THE HOME:  Any stairs in or around the home? Yes  If so, are there any without handrails? No  Home free of loose throw rugs in walkways, pet beds, electrical cords, etc? Yes  Adequate lighting in your home to reduce risk of falls? Yes   ASSISTIVE DEVICES UTILIZED TO PREVENT FALLS:  Life alert? Yes  Use of a cane, walker or w/c? No  Grab bars in the bathroom? No  Shower chair or bench in shower? Yes  Elevated toilet seat or a handicapped toilet? No   TIMED UP AND GO:  Was the test performed? Yes .  Length of time to ambulate 10 feet: 10 sec.   Gait slow and steady without use of assistive device  Cognitive Function: MMSE - Mini Mental State Exam 10/04/2018  Orientation to time 5  Orientation to Place 5  Registration 3  Attention/ Calculation 5  Recall 3  Language- name 2 objects 2  Language- repeat 1  Language- follow 3 step command 3  Language- read & follow direction 1  Write a sentence 1  Copy design 1  Total score 30     6CIT Screen 10/17/2020  What Year? 0 points  What month? 0 points  What time? 0 points  Count back from 20 0 points  Months in reverse 0 points  Repeat phrase 2 points  Total Score 2    Immunizations Immunization History  Administered Date(s) Administered   Fluad Quad(high Dose 65+) 09/08/2018, 11/12/2019, 10/17/2020   PFIZER(Purple Top)SARS-COV-2 Vaccination 02/11/2019,  03/04/2019, 10/15/2019   Tdap 04/14/2011   Zoster, Live 04/14/2011     TDAP status: Up to date  Flu Vaccine status: Completed at today's visit   Covid-19 vaccine status: Completed vaccines  Qualifies for Shingles Vaccine? Yes   Zostavax completed No   Shingrix Completed?: No.    Education has been provided regarding the importance of this vaccine. Patient has been advised to call insurance company to determine out of pocket expense if they have not yet received this vaccine. Advised may also receive vaccine at local pharmacy or Health Dept. Verbalized acceptance and understanding.  Screening Tests Health Maintenance  Topic Date Due   Zoster Vaccines- Shingrix (1 of 2) Never done   COVID-19 Vaccine (4 - Booster for Pfizer series) 01/07/2020   FOOT EXAM  01/01/2021   HEMOGLOBIN A1C  01/14/2021   MAMMOGRAM  02/22/2021   OPHTHALMOLOGY EXAM  02/27/2021   TETANUS/TDAP  04/13/2021   DEXA SCAN  09/19/2023   COLONOSCOPY (Pts 45-30yr Insurance coverage will need to be confirmed)  02/22/2027   INFLUENZA VACCINE  Completed   HPV VACCINES  Aged Out   Hepatitis C Screening  Discontinued    Health Maintenance  Health Maintenance Due  Topic Date Due   Zoster Vaccines- Shingrix (1 of 2) Never done   COVID-19 Vaccine (4 - Booster for Pfizer series) 01/07/2020    Colorectal cancer screening: Type of screening: Colonoscopy. Completed 02/21/17. Repeat every 10 years  Mammogram status: Completed 02/23/20. Repeat every year  Bone Density status: Completed 09/18/20. Results reflect: Bone density results: OSTEOPENIA. Repeat every 2 years.    Additional Screening:  Hepatitis C Screening: does not qualify;  Vision Screening: Recommended annual ophthalmology exams for early detection of glaucoma and other disorders of the eye. Is the patient up to date with their annual eye exam?  Yes  Who is the provider or what is the name of the office in which the patient attends annual eye exams? Dr GKaty FitchIf pt is not established with a provider, would they like to be  referred to a provider to establish care? No .   Dental Screening: Recommended annual dental exams for proper oral hygiene  Community Resource Referral / Chronic Care Management: CRR required this visit?  No   CCM required this visit?  No      Plan:     I have personally reviewed and noted the following in the patient's chart:   Medical and social history Use of alcohol, tobacco or illicit drugs  Current medications and supplements including opioid prescriptions.  Functional ability and status Nutritional status Physical activity Advanced directives List of other physicians Hospitalizations, surgeries, and ER visits in previous 12 months Vitals Screenings to include cognitive, depression, and falls Referrals and appointments  In addition, I have reviewed and discussed with patient certain preventive protocols, quality metrics, and best practice recommendations. A written personalized care plan for preventive services as well as general preventive health recommendations were provided to patient.     TWillette Brace LPN   [id number]  Nurse Notes: None

## 2020-10-17 NOTE — Patient Instructions (Signed)
Amy Bartlett , Thank you for taking time to come for your Medicare Wellness Visit. I appreciate your ongoing commitment to your health goals. Please review the following plan we discussed and let me know if I can assist you in the future.   Screening recommendations/referrals: Colonoscopy: Done 02/21/17 repeat every 10 years  Mammogram: Done 02/23/20 repeat every year Bone Density: Done 09/18/20 repeat every 2 years  Recommended yearly ophthalmology/optometry visit for glaucoma screening and checkup Recommended yearly dental visit for hygiene and checkup  Vaccinations: Influenza vaccine: Done 10/17/20 repeat every year  Pneumococcal vaccine: not a candidate  Tdap vaccine: Done 04/14/11 repeat every 10 years  Shingles vaccine: Shingrix discussed. Please contact your pharmacy for coverage information.    Covid-19:Completed 2/7, 2/28 & 10/15/19  Advanced directives: Advance directive discussed with you today. I have provided a copy for you to complete at home and have notarized. Once this is complete please bring a copy in to our office so we can scan it into your chart.  Conditions/risks identified: Lose weight   Next appointment: Follow up in one year for your annual wellness visit    Preventive Care 65 Years and Older, Female Preventive care refers to lifestyle choices and visits with your health care provider that can promote health and wellness. What does preventive care include? A yearly physical exam. This is also called an annual well check. Dental exams once or twice a year. Routine eye exams. Ask your health care provider how often you should have your eyes checked. Personal lifestyle choices, including: Daily care of your teeth and gums. Regular physical activity. Eating a healthy diet. Avoiding tobacco and drug use. Limiting alcohol use. Practicing safe sex. Taking low-dose aspirin every day. Taking vitamin and mineral supplements as recommended by your health care  provider. What happens during an annual well check? The services and screenings done by your health care provider during your annual well check will depend on your age, overall health, lifestyle risk factors, and family history of disease. Counseling  Your health care provider may ask you questions about your: Alcohol use. Tobacco use. Drug use. Emotional well-being. Home and relationship well-being. Sexual activity. Eating habits. History of falls. Memory and ability to understand (cognition). Work and work Astronomer. Reproductive health. Screening  You may have the following tests or measurements: Height, weight, and BMI. Blood pressure. Lipid and cholesterol levels. These may be checked every 5 years, or more frequently if you are over 47 years old. Skin check. Lung cancer screening. You may have this screening every year starting at age 63 if you have a 30-pack-year history of smoking and currently smoke or have quit within the past 15 years. Fecal occult blood test (FOBT) of the stool. You may have this test every year starting at age 56. Flexible sigmoidoscopy or colonoscopy. You may have a sigmoidoscopy every 5 years or a colonoscopy every 10 years starting at age 57. Hepatitis C blood test. Hepatitis B blood test. Sexually transmitted disease (STD) testing. Diabetes screening. This is done by checking your blood sugar (glucose) after you have not eaten for a while (fasting). You may have this done every 1-3 years. Bone density scan. This is done to screen for osteoporosis. You may have this done starting at age 61. Mammogram. This may be done every 1-2 years. Talk to your health care provider about how often you should have regular mammograms. Talk with your health care provider about your test results, treatment options, and if necessary, the  need for more tests. Vaccines  Your health care provider may recommend certain vaccines, such as: Influenza vaccine. This is  recommended every year. Tetanus, diphtheria, and acellular pertussis (Tdap, Td) vaccine. You may need a Td booster every 10 years. Zoster vaccine. You may need this after age 66. Pneumococcal 13-valent conjugate (PCV13) vaccine. One dose is recommended after age 71. Pneumococcal polysaccharide (PPSV23) vaccine. One dose is recommended after age 8. Talk to your health care provider about which screenings and vaccines you need and how often you need them. This information is not intended to replace advice given to you by your health care provider. Make sure you discuss any questions you have with your health care provider. Document Released: 01/17/2015 Document Revised: 09/10/2015 Document Reviewed: 10/22/2014 Elsevier Interactive Patient Education  2017 [place] Prevention in the Home Falls can cause injuries. They can happen to people of all ages. There are many things you can do to make your home safe and to help prevent falls. What can I do on the outside of my home? Regularly fix the edges of walkways and driveways and fix any cracks. Remove anything that might make you trip as you walk through a door, such as a raised step or threshold. Trim any bushes or trees on the path to your home. Use bright outdoor lighting. Clear any walking paths of anything that might make someone trip, such as rocks or tools. Regularly check to see if handrails are loose or broken. Make sure that both sides of any steps have handrails. Any raised decks and porches should have guardrails on the edges. Have any leaves, snow, or ice cleared regularly. Use sand or salt on walking paths during winter. Clean up any spills in your garage right away. This includes oil or grease spills. What can I do in the bathroom? Use night lights. Install grab bars by the toilet and in the tub and shower. Do not use towel bars as grab bars. Use non-skid mats or decals in the tub or shower. If you need to sit down in  the shower, use a plastic, non-slip stool. Keep the floor dry. Clean up any water that spills on the floor as soon as it happens. Remove soap buildup in the tub or shower regularly. Attach bath mats securely with double-sided non-slip rug tape. Do not have throw rugs and other things on the floor that can make you trip. What can I do in the bedroom? Use night lights. Make sure that you have a light by your bed that is easy to reach. Do not use any sheets or blankets that are too big for your bed. They should not hang down onto the floor. Have a firm chair that has side arms. You can use this for support while you get dressed. Do not have throw rugs and other things on the floor that can make you trip. What can I do in the kitchen? Clean up any spills right away. Avoid walking on wet floors. Keep items that you use a lot in easy-to-reach places. If you need to reach something above you, use a strong step stool that has a grab bar. Keep electrical cords out of the way. Do not use floor polish or wax that makes floors slippery. If you must use wax, use non-skid floor wax. Do not have throw rugs and other things on the floor that can make you trip. What can I do with my stairs? Do not leave any items on the  stairs. Make sure that there are handrails on both sides of the stairs and use them. Fix handrails that are broken or loose. Make sure that handrails are as long as the stairways. Check any carpeting to make sure that it is firmly attached to the stairs. Fix any carpet that is loose or worn. Avoid having throw rugs at the top or bottom of the stairs. If you do have throw rugs, attach them to the floor with carpet tape. Make sure that you have a light switch at the top of the stairs and the bottom of the stairs. If you do not have them, ask someone to add them for you. What else can I do to help prevent falls? Wear shoes that: Do not have high heels. Have rubber bottoms. Are comfortable  and fit you well. Are closed at the toe. Do not wear sandals. If you use a stepladder: Make sure that it is fully opened. Do not climb a closed stepladder. Make sure that both sides of the stepladder are locked into place. Ask someone to hold it for you, if possible. Clearly mark and make sure that you can see: Any grab bars or handrails. First and last steps. Where the edge of each step is. Use tools that help you move around (mobility aids) if they are needed. These include: Canes. Walkers. Scooters. Crutches. Turn on the lights when you go into a dark area. Replace any light bulbs as soon as they burn out. Set up your furniture so you have a clear path. Avoid moving your furniture around. If any of your floors are uneven, fix them. If there are any pets around you, be aware of where they are. Review your medicines with your doctor. Some medicines can make you feel dizzy. This can increase your chance of falling. Ask your doctor what other things that you can do to help prevent falls. This information is not intended to replace advice given to you by your health care provider. Make sure you discuss any questions you have with your health care provider. Document Released: 10/17/2008 Document Revised: 05/29/2015 Document Reviewed: 01/25/2014 Elsevier Interactive Patient Education  2017 [employer].

## 2020-12-04 DIAGNOSIS — I4729 Other ventricular tachycardia: Secondary | ICD-10-CM | POA: Diagnosis not present

## 2020-12-04 DIAGNOSIS — I10 Essential (primary) hypertension: Secondary | ICD-10-CM | POA: Diagnosis not present

## 2020-12-04 DIAGNOSIS — I442 Atrioventricular block, complete: Secondary | ICD-10-CM | POA: Diagnosis not present

## 2020-12-15 ENCOUNTER — Other Ambulatory Visit: Payer: Self-pay | Admitting: Family Medicine

## 2020-12-15 ENCOUNTER — Telehealth: Payer: Self-pay

## 2020-12-15 NOTE — Telephone Encounter (Signed)
Pt called needing Ultra Test Strips called in to [id number] - [place], [place] - [address] Please Advise.

## 2020-12-15 NOTE — Telephone Encounter (Signed)
LVM for patient to return call. 

## 2020-12-16 ENCOUNTER — Other Ambulatory Visit: Payer: Self-pay

## 2020-12-16 DIAGNOSIS — E1122 Type 2 diabetes mellitus with diabetic chronic kidney disease: Secondary | ICD-10-CM

## 2020-12-16 MED ORDER — BLOOD GLUCOSE METER KIT: PACK | 0 refills | Status: DC

## 2020-12-16 NOTE — Telephone Encounter (Signed)
Spoke with patient, is scheduled for a diabetes follow up

## 2020-12-18 ENCOUNTER — Other Ambulatory Visit: Payer: Self-pay

## 2020-12-18 ENCOUNTER — Encounter: Payer: Self-pay | Admitting: Family Medicine

## 2020-12-18 ENCOUNTER — Telehealth: Payer: Self-pay

## 2020-12-18 ENCOUNTER — Ambulatory Visit (INDEPENDENT_AMBULATORY_CARE_PROVIDER_SITE_OTHER): Payer: Medicare PPO | Admitting: Family Medicine

## 2020-12-18 VITALS — BP 124/88 | HR 88 | Temp 97.2°F | Ht 63.0 in | Wt 202.2 lb

## 2020-12-18 DIAGNOSIS — N183 Chronic kidney disease, stage 3 unspecified: Secondary | ICD-10-CM | POA: Diagnosis not present

## 2020-12-18 DIAGNOSIS — Z23 Encounter for immunization: Secondary | ICD-10-CM | POA: Diagnosis not present

## 2020-12-18 DIAGNOSIS — E669 Obesity, unspecified: Secondary | ICD-10-CM

## 2020-12-18 DIAGNOSIS — I4729 Other ventricular tachycardia: Secondary | ICD-10-CM

## 2020-12-18 DIAGNOSIS — Z0000 Encounter for general adult medical examination without abnormal findings: Secondary | ICD-10-CM | POA: Diagnosis not present

## 2020-12-18 DIAGNOSIS — E1142 Type 2 diabetes mellitus with diabetic polyneuropathy: Secondary | ICD-10-CM | POA: Insufficient documentation

## 2020-12-18 DIAGNOSIS — E559 Vitamin D deficiency, unspecified: Secondary | ICD-10-CM

## 2020-12-18 DIAGNOSIS — E782 Mixed hyperlipidemia: Secondary | ICD-10-CM

## 2020-12-18 DIAGNOSIS — I152 Hypertension secondary to endocrine disorders: Secondary | ICD-10-CM | POA: Diagnosis not present

## 2020-12-18 DIAGNOSIS — E1159 Type 2 diabetes mellitus with other circulatory complications: Secondary | ICD-10-CM

## 2020-12-18 DIAGNOSIS — E1122 Type 2 diabetes mellitus with diabetic chronic kidney disease: Secondary | ICD-10-CM

## 2020-12-18 DIAGNOSIS — E871 Hypo-osmolality and hyponatremia: Secondary | ICD-10-CM

## 2020-12-18 DIAGNOSIS — M818 Other osteoporosis without current pathological fracture: Secondary | ICD-10-CM

## 2020-12-18 DIAGNOSIS — E1169 Type 2 diabetes mellitus with other specified complication: Secondary | ICD-10-CM

## 2020-12-18 DIAGNOSIS — Z8679 Personal history of other diseases of the circulatory system: Secondary | ICD-10-CM

## 2020-12-18 DIAGNOSIS — Z950 Presence of cardiac pacemaker: Secondary | ICD-10-CM | POA: Diagnosis not present

## 2020-12-18 DIAGNOSIS — K219 Gastro-esophageal reflux disease without esophagitis: Secondary | ICD-10-CM

## 2020-12-18 LAB — CBC WITH DIFFERENTIAL/PLATELET
Basophils Absolute: 0 10*3/uL (ref 0.0–0.1)
Basophils Relative: 0.9 % (ref 0.0–3.0)
Eosinophils Absolute: 0.1 10*3/uL (ref 0.0–0.7)
Eosinophils Relative: 3.2 % (ref 0.0–5.0)
HCT: 37.9 % (ref 36.0–46.0)
Hemoglobin: 12.5 g/dL (ref 12.0–15.0)
Lymphocytes Relative: 41.7 % (ref 12.0–46.0)
Lymphs Abs: 1.9 10*3/uL (ref 0.7–4.0)
MCHC: 33 g/dL (ref 30.0–36.0)
MCV: 82.6 fl (ref 78.0–100.0)
Monocytes Absolute: 0.4 10*3/uL (ref 0.1–1.0)
Monocytes Relative: 10 % (ref 3.0–12.0)
Neutro Abs: 2 10*3/uL (ref 1.4–7.7)
Neutrophils Relative %: 44.2 % (ref 43.0–77.0)
Platelets: 235 10*3/uL (ref 150.0–400.0)
RBC: 4.59 Mil/uL (ref 3.87–5.11)
RDW: 16 % — ABNORMAL HIGH (ref 11.5–15.5)
WBC: 4.5 10*3/uL (ref 4.0–10.5)

## 2020-12-18 LAB — LIPID PANEL
Cholesterol: 124 mg/dL (ref 0–200)
HDL: 48.1 mg/dL (ref >39.00)
LDL Cholesterol: 59 mg/dL (ref 0–99)
NonHDL: 75.83
Total CHOL/HDL Ratio: 3
Triglycerides: 84 mg/dL (ref 0.0–149.0)
VLDL: 16.8 mg/dL (ref 0.0–40.0)

## 2020-12-18 LAB — COMPREHENSIVE METABOLIC PANEL
ALT: 20 U/L (ref 0–35)
AST: 18 U/L (ref 0–37)
Albumin: 4.1 g/dL (ref 3.5–5.2)
Alkaline Phosphatase: 56 U/L (ref 39–117)
BUN: 30 mg/dL — ABNORMAL HIGH (ref 6–23)
CO2: 28 mEq/L (ref 19–32)
Calcium: 9.4 mg/dL (ref 8.4–10.5)
Chloride: 100 mEq/L (ref 96–112)
Creatinine, Ser: 1.45 mg/dL — ABNORMAL HIGH (ref 0.40–1.20)
GFR: 36.71 mL/min — ABNORMAL LOW (ref >60.00)
Glucose, Bld: 122 mg/dL — ABNORMAL HIGH (ref 70–99)
Potassium: 4.6 mEq/L (ref 3.5–5.1)
Sodium: 135 mEq/L (ref 135–145)
Total Bilirubin: 0.5 mg/dL (ref 0.2–1.2)
Total Protein: 7.5 g/dL (ref 6.0–8.3)

## 2020-12-18 LAB — MICROALBUMIN / CREATININE URINE RATIO
Creatinine,U: 48.2 mg/dL
Microalb Creat Ratio: 1.5 mg/g (ref 0.0–30.0)
Microalb, Ur: <0.7 mg/dL (ref 0.0–1.9)

## 2020-12-18 LAB — POCT GLYCOSYLATED HEMOGLOBIN (HGB A1C): Hemoglobin A1C: 7.1 % — AB (ref 4.0–5.6)

## 2020-12-18 LAB — TSH: TSH: 1.46 u[IU]/mL (ref 0.35–5.50)

## 2020-12-18 LAB — VITAMIN D 25 HYDROXY (VIT D DEFICIENCY, FRACTURES): VITD: 84.16 ng/mL (ref 30.00–100.00)

## 2020-12-18 MED ORDER — OMEPRAZOLE 20 MG PO CPDR: 20.0000 mg | DELAYED_RELEASE_CAPSULE | Freq: Every day | ORAL | 2 refills | Status: DC

## 2020-12-18 MED ORDER — ALENDRONATE SODIUM 70 MG PO TABS: ORAL_TABLET | ORAL | 0 refills | Status: DC

## 2020-12-18 MED ORDER — EMPAGLIFLOZIN 25 MG PO TABS: 25.0000 mg | ORAL_TABLET | Freq: Every day | ORAL | 3 refills | Status: DC

## 2020-12-18 MED ORDER — METFORMIN HCL ER 750 MG PO TB24: ORAL_TABLET | ORAL | 3 refills | Status: DC

## 2020-12-18 MED ORDER — SHINGRIX 50 MCG/0.5ML IM SUSR: 0.5000 mL | Freq: Once | INTRAMUSCULAR | 0 refills | Status: AC

## 2020-12-18 MED ORDER — LISINOPRIL-HYDROCHLOROTHIAZIDE 20-25 MG PO TABS: 1.0000 | ORAL_TABLET | Freq: Every day | ORAL | 3 refills | Status: DC

## 2020-12-18 MED ORDER — TRULICITY 1.5 MG/0.5ML ~~LOC~~ SOAJ: 1.5000 mg | SUBCUTANEOUS | 3 refills | Status: DC

## 2020-12-18 NOTE — Telephone Encounter (Signed)
Patient was seen today by Dr. Mardelle Matte and was told to start CoQ10 but was not told what not given the dose/frequency- please contact the patient with this information so she can pick some up at the pharmacy

## 2020-12-18 NOTE — Telephone Encounter (Signed)
Please advise dosage

## 2020-12-18 NOTE — Progress Notes (Signed)
Subjective  Chief Complaint  Patient presents with   Diabetes   Gastroesophageal Reflux    Believes she is having acid reflux, has some throat burning with it at times   Hypertension   Hyperlipidemia   Annual Exam    Fasting    HPI: Amy Bartlett is a 70 y.o. female who presents to [facility] [place] today for a Female Wellness Visit. She also has the concerns and/or needs as listed above in the chief complaint. These will be addressed in addition to the Health Maintenance Visit.   Wellness Visit: annual visit with health maintenance review and exam without Pap  Health maintenance: All screens are current.  Eligible for Prevnar 20.  Eligible for Shingrix vaccinations Chronic disease f/u and/or acute problem visit: (deemed necessary to be done in addition to the wellness visit): Type 2 diabetes with kidney disease and neuropathy: Continues on metformin XR 750, 2 tablets daily, Trulicity 0.75 mg weekly, and Jardiance 25 mg daily.  She reports her diet has worsened over the last several months.  Weight is up a few pounds.  She denies symptoms of hyperglycemia.  She endorses paresthesias mostly in the great toes.  No burning pain.  No sores.  Eye exam is up-to-date without retinopathy. Hyperlipidemia on atorvastatin 40 mg nightly.  She complains of myalgias.  Pain continue to worsen and lessen her quality of life. Reviewed recent cardiology follow-up for history of complete heart block status post pacemaker history of nonsustained ventricular tachycardia.  Pacemaker is working properly.  No recent chest pain or arrhythmia symptoms. Hypertension has been well controlled on ACE inhibitor HCTZ combination.  She does run mild hyponatremia because of HCTZ. Osteoporosis: Reviewed most recent bone density which showed improved T score on Fosamax x2 years.  Now in the osteopenic range.  She is tolerating the Fosamax however she does endorse reflux symptoms over the last  month.  No daily pain.  No melena.  She takes the Fosamax as recommended.  Assessment  1. Annual physical exam   2. Controlled type 2 diabetes mellitus with stage 3 chronic kidney disease, without long-term current use of insulin (HCC)   3. Combined hyperlipidemia associated with type 2 diabetes mellitus (HCC)   4. Hypertension associated with diabetes (HCC)   5. History of complete heart block   6. Age-related osteoporosis without fracture   7. Vitamin D deficiency   8. Cardiac pacemaker in situ   9. NSVT (nonsustained ventricular tachycardia)   10. Hyponatremia - HCTZ   11. Obesity (BMI 30-39.9)   12. Gastroesophageal reflux disease, unspecified whether esophagitis present   13. Diabetic peripheral neuropathy associated with type 2 diabetes mellitus (HCC)   14. Immunization due      Plan  Female Wellness Visit: Age appropriate Health Maintenance and Prevention measures were discussed with patient. Included topics are cancer screening recommendations, ways to keep healthy (see AVS) including dietary and exercise recommendations, regular eye and dental care, use of seat belts, and avoidance of moderate alcohol use and tobacco use.  Screens are current BMI: discussed patient's BMI and encouraged positive lifestyle modifications to help get to or maintain a target BMI. HM needs and immunizations were addressed and ordered. See below for orders. See HM and immunization section for updates.  Prevnar 20 given today.  Shingrix prescriptions given after counseling. Routine labs and screening tests ordered including cmp, cbc and lipids where appropriate. Discussed recommendations regarding Vit D and calcium supplementation (see AVS)  Chronic disease management visit and/or acute problem visit: Diabetes: Control is mildly worsening.  Likely due to diet changes.  Recommend increasing Trulicity 1.5 mg weekly.  Continue metformin and Jardiance at same dose.  Recommend improving diet.  Recheck 3  months. Hypertension is controlled.  Continue lisinopril HCTZ.  Check renal function and electrolytes. Hyperlipidemia on statin would likely secondary to myalgias.  Recommend holding for the next 2 to 4 weeks.  Can rechallenge with coenzyme every 10 or change to Crestor.  Recheck fasting lipids today Osteoporosis: Continue Fosamax and recheck bone density in 2 years.  Could then consider a drug holiday.  Continue vitamin D and calcium supplementation and weightbearing exercises.  Follow up: 3 mo for diabetes f/u  Orders Placed This Encounter  Procedures   Pneumococcal conjugate vaccine 20-valent (Prevnar 20)   CBC with Differential/Platelet   Comprehensive metabolic panel   Lipid panel   TSH   VITAMIN D 25 Hydroxy (Vit-D Deficiency, Fractures)   Microalbumin / creatinine urine ratio   POCT HgB A1C   Meds ordered this encounter  Medications   alendronate (FOSAMAX) 70 MG tablet    Sig: TAKE 1 TABLET BY MOUTH EVERY 7 DAYS WITH A FULL GLASS OF WATER ON AN EMPTY STOMACH    Dispense:  12 tablet    Refill:  0   lisinopril-hydrochlorothiazide (ZESTORETIC) 20-25 MG tablet    Sig: Take 1 tablet by mouth daily.    Dispense:  90 tablet    Refill:  3   metFORMIN (GLUCOPHAGE-XR) 750 MG 24 hr tablet    Sig: TAKE TWO TABLETS BY MOUTH DAILY WITH BREAKFAST    Dispense:  180 tablet    Refill:  3   omeprazole (PRILOSEC) 20 MG capsule    Sig: Take 1 capsule (20 mg total) by mouth daily.    Dispense:  30 capsule    Refill:  2   Zoster Vaccine Adjuvanted [facility]) injection    Sig: Inject 0.5 mLs into the muscle once for 1 dose. Please give 2nd dose 2-6 months after first dose    Dispense:  2 each    Refill:  0   empagliflozin (JARDIANCE) 25 MG TABS tablet    Sig: Take 1 tablet (25 mg total) by mouth daily with breakfast.    Dispense:  90 tablet    Refill:  3   Dulaglutide (TRULICITY) 1.5 0000000 SOPN    Sig: Inject 1.5 mg into the skin once a week.    Dispense:  6 mL    Refill:  3       Body mass index is 35.82 kg/m. Wt Readings from Last 3 Encounters:  12/18/20 202 lb 3.2 oz (91.7 kg)  10/17/20 200 lb (90.7 kg)  07/14/20 192 lb 6.4 oz (87.3 kg)     Patient Active Problem List   Diagnosis Date Noted   Cardiac pacemaker in situ 03/19/2019    Priority: High    Added automatically from request for surgery [id number]    CKD (chronic kidney disease) stage 3, GFR 30-59 ml/min ([place]) 10/04/2016    Priority: High   NSVT (nonsustained ventricular tachycardia) 12/05/2015    Priority: High    Noted on PPM.  Echo on 08/2015 EF normal     History of complete heart block 10/04/2012    Priority: High    Overview:  Dr. Georg Ruddle, S/p pacemaker, stable 2014/cla No ischemia by stress testing 2011    Combined hyperlipidemia associated with type 2 diabetes mellitus ([place]) 11/04/2011  Priority: High   Controlled type 2 diabetes mellitus with stage 3 chronic kidney disease, without long-term current use of insulin ([place]) 11/04/2011    Priority: High   Hypertension associated with diabetes ([place]) 01/11/2011    Priority: High   Spondylosis of lumbar region without myelopathy or radiculopathy 07/14/2020    Priority: Medium    Obesity (BMI 30-39.9) 01/12/2016    Priority: Medium    Primary localized osteoarthrosis of left lower leg 07/16/2014    Priority: Medium    Age-related osteoporosis without fracture 06/01/2011    Priority: Medium     Dexa 2022: T = -1.6 lowest, improved on fosamax x 3 years. Continue x 2 more years and recheck dexa 2024. Drug holiday if stable at that time. T = -2.6 04/2017 in L hip. New dx: started fosamax 06/2017 T = -1.2 05/2011; recheck at age 33.    Hyponatremia - HCTZ 07/15/2020    Priority: Low   Vitamin D deficiency 04/16/2011    Priority: Low   Diabetic peripheral neuropathy associated with type 2 diabetes mellitus ([place]) 12/18/2020   Health Maintenance  Topic Date Due   Zoster Vaccines- Shingrix (1 of 2) Never done   MAMMOGRAM  02/22/2021    OPHTHALMOLOGY EXAM  02/27/2021   TETANUS/TDAP  04/13/2021   HEMOGLOBIN A1C  06/18/2021   FOOT EXAM  12/18/2021   DEXA SCAN  09/19/2022   COLONOSCOPY (Pts 45-38yrs Insurance coverage will need to be confirmed)  02/22/2027   Pneumonia Vaccine 51+ Years old  Completed   INFLUENZA VACCINE  Completed   COVID-19 Vaccine  Completed   HPV VACCINES  Aged Out   Hepatitis C Screening  Discontinued   Immunization History  Administered Date(s) Administered   Fluad Quad(high Dose 65+) 09/08/2018, 11/12/2019, 10/17/2020   PFIZER(Purple Top)SARS-COV-2 Vaccination 02/11/2019, 03/04/2019, 10/15/2019   PNEUMOCOCCAL CONJUGATE-20 12/18/2020   Pfizer Covid-19 Vaccine Bivalent Booster 80yrs & up 11/18/2020   Tdap 04/14/2011   Zoster, Live 04/14/2011   We updated and reviewed the patient's past history in detail and it is documented below. Allergies: Patient has No Known Allergies. Past Medical History Patient  has a past medical history of Arthritis, Chicken pox, Diabetes mellitus ([place]), Hyperlipidemia, Hypertension, Hyponatremia - HCTZ (07/15/2020), and Osteopenia. Past Surgical History Patient  has a past surgical history that includes Reduction mammaplasty (Bilateral, 2009); Cardiac pacemaker placement; Tubal ligation; and Cesarean section (1974, 1977, 1979). Family History: Patient family history includes Arthritis in her father; Diabetes in her brother; Glaucoma in her mother; Healthy in her daughter and daughter; Heart disease in her mother; Hypertension in her mother; Osteoporosis in her mother; Pneumonia in her father; Stroke in her brother. Social History:  Patient  reports that she has never smoked. She has never used smokeless tobacco. She reports that she does not drink alcohol and does not use drugs.  Review of Systems: Constitutional: negative for fever or malaise Ophthalmic: negative for photophobia, double vision or loss of vision Cardiovascular: negative for chest pain, dyspnea on  exertion, or new LE swelling Respiratory: negative for SOB or persistent cough Gastrointestinal: negative for abdominal pain, change in bowel habits or melena Genitourinary: negative for dysuria or gross hematuria, no abnormal uterine bleeding or disharge Musculoskeletal: negative for new gait disturbance or muscular weakness Integumentary: negative for new or persistent rashes, no breast lumps Neurological: negative for TIA or stroke symptoms Psychiatric: negative for SI or delusions Allergic/Immunologic: negative for hives  Patient Care Team    Relationship Specialty Notifications Start End  Leamon Arnt, MD PCP - General Family Medicine  06/28/17   Clent Jacks, MD Consulting Physician Ophthalmology  06/28/17   Raina Mina, MD Referring Physician Nephrology  06/28/17   Marlane Hatcher, MD Referring Physician Cardiology  06/28/17   Dr. Gerlene Burdock  Dentistry  06/28/17     Objective  Vitals: BP 124/88    Pulse 88    Temp (!) 97.2 F (36.2 C) (Temporal)    Ht 5\' 3"  (1.6 m)    Wt 202 lb 3.2 oz (91.7 kg)    SpO2 95%    BMI 35.82 kg/m  General:  Well developed, well nourished, no acute distress  Psych:  Alert and orientedx3,normal mood and affect HEENT:  Normocephalic, atraumatic, non-icteric sclera,  supple neck without adenopathy, mass or thyromegaly Cardiovascular:  Normal S1, S2, RRR without gallop, rub or murmur Respiratory:  Good breath sounds bilaterally, CTAB with normal respiratory effort Gastrointestinal: normal bowel sounds, soft, non-tender, no noted masses. No HSM MSK: no deformities, contusions. Joints are without erythema or swelling.  Skin:  Warm, no rashes or suspicious lesions noted Diabetic Foot Exam: Appearance - no lesions, ulcers or calluses Skin - no sigificant pallor or erythema Monofilament testing - sensitive bilaterally in following locations:  Right - Great toe, medial, central, lateral ball and posterior foot intact  Left - Great toe, medial, central,  lateral ball and posterior foot intact Pulses - +2 distally bilaterally Nl proprioception bilaterall    Lab Results  Component Value Date   HGBA1C 7.1 (A) 12/18/2020   HGBA1C 6.5 (A) 07/14/2020   HGBA1C 8.5 (A) 04/07/2020    Lab Results  Component Value Date   CHOL 149 07/14/2020   HDL 54.30 07/14/2020   LDLCALC 70 07/14/2020   LDLDIRECT 134.0 01/02/2020   TRIG 122.0 07/14/2020   CHOLHDL 3 07/14/2020   Lab Results  Component Value Date   CREATININE 1.25 (H) 07/14/2020   BUN 22 07/14/2020   NA 133 (L) 07/14/2020   K 4.4 07/14/2020   CL 99 07/14/2020   CO2 26 07/14/2020    Commons side effects, risks, benefits, and alternatives for medications and treatment plan prescribed today were discussed, and the patient expressed understanding of the given instructions. Patient is instructed to call or message via MyChart if he/she has any questions or concerns regarding our treatment plan. No barriers to understanding were identified. We discussed Red Flag symptoms and signs in detail. Patient expressed understanding regarding what to do in case of urgent or emergency type symptoms.  Medication list was reconciled, printed and provided to the patient in AVS. Patient instructions and summary information was reviewed with the patient as documented in the AVS. This note was prepared with assistance of Dragon voice recognition software. Occasional wrong-word or sound-a-like substitutions may have occurred due to the inherent limitations of voice recognition software  This visit occurred during the SARS-CoV-2 public health emergency.  Safety protocols were in place, including screening questions prior to the visit, additional usage of staff PPE, and extensive cleaning of exam room while observing appropriate contact time as indicated for disinfecting solutions.

## 2020-12-18 NOTE — Patient Instructions (Addendum)
Please return in 3 months for diabetes follow up   I will release your lab results to you on your MyChart account with further instructions. Please reply with any questions.    Please stop atorvastatin for 2-4 weeks; this should resolve your muscle aches.  You may then restart at 2-3x/week to see if you can tolerate this. Add coenzyme Q10 along with it to help decrease side effects.  Hold your fosamax for 1-2 months. Take the omeprazole daily for 4-6 weeks to relieve your reflux.  Please work on improving your diet again; I have increased the dose of your trulicity to 1.5mg  weekly to help better control your diabetes. Continue your jardiance and metformin as well.   Today you were given your Prevnar 20 vaccination.  This protects you from serious pneuomonia.  Please take the prescription for Shingrix to the pharmacy so they may administer the vaccinations. Your insurance will then cover the injections.   If you have any questions or concerns, please don't hesitate to send me a message via MyChart or call the office at [phone]. Thank you for visiting with [place] today! It's our pleasure caring for you.   Diabetes Mellitus and Foot Care Foot care is an important part of your health, especially when you have diabetes. Diabetes may cause you to have problems because of poor blood flow (circulation) to your feet and legs, which can cause your skin to: Become thinner and drier. Break more easily. Heal more slowly. Peel and crack. You may also have nerve damage (neuropathy) in your legs and feet, causing decreased feeling in them. This means that you may not notice minor injuries to your feet that could lead to more serious problems. Noticing and addressing any potential problems early is the best way to prevent future foot problems. How to care for your feet Foot hygiene  Wash your feet daily with warm water and mild soap. Do not use hot water. Then, pat your feet and the areas between your toes  until they are completely dry. Do not soak your feet as this can dry your skin. Trim your toenails straight across. Do not dig under them or around the cuticle. File the edges of your nails with an emery board or nail file. Apply a moisturizing lotion or petroleum jelly to the skin on your feet and to dry, brittle toenails. Use lotion that does not contain alcohol and is unscented. Do not apply lotion between your toes. Shoes and socks Wear clean socks or stockings every day. Make sure they are not too tight. Do not wear knee-high stockings since they may decrease blood flow to your legs. Wear shoes that fit properly and have enough cushioning. Always look in your shoes before you put them on to be sure there are no objects inside. To break in new shoes, wear them for just a few hours a day. This prevents injuries on your feet. Wounds, scrapes, corns, and calluses  Check your feet daily for blisters, cuts, bruises, sores, and redness. If you cannot see the bottom of your feet, use a mirror or ask someone for help. Do not cut corns or calluses or try to remove them with medicine. If you find a minor scrape, cut, or break in the skin on your feet, keep it and the skin around it clean and dry. You may clean these areas with mild soap and water. Do not clean the area with peroxide, alcohol, or iodine. If you have a wound, scrape, corn, or  callus on your foot, look at it several times a day to make sure it is healing and not infected. Check for: Redness, swelling, or pain. Fluid or blood. Warmth. Pus or a bad smell. General tips Do not cross your legs. This may decrease blood flow to your feet. Do not use heating pads or hot water bottles on your feet. They may burn your skin. If you have lost feeling in your feet or legs, you may not know this is happening until it is too late. Protect your feet from hot and cold by wearing shoes, such as at the beach or on hot pavement. Schedule a complete foot  exam at least once a year (annually) or more often if you have foot problems. Report any cuts, sores, or bruises to your health care provider immediately. Where to find more information American Diabetes Association: www.diabetes.org Association of Diabetes Care & Education Specialists: www.diabeteseducator.org Contact a health care provider if: You have a medical condition that increases your risk of infection and you have any cuts, sores, or bruises on your feet. You have an injury that is not healing. You have redness on your legs or feet. You feel burning or tingling in your legs or feet. You have pain or cramps in your legs and feet. Your legs or feet are numb. Your feet always feel cold. You have pain around any toenails. Get help right away if: You have a wound, scrape, corn, or callus on your foot and: You have pain, swelling, or redness that gets worse. You have fluid or blood coming from the wound, scrape, corn, or callus. Your wound, scrape, corn, or callus feels warm to the touch. You have pus or a bad smell coming from the wound, scrape, corn, or callus. You have a fever. You have a red line going up your leg. Summary Check your feet every day for blisters, cuts, bruises, sores, and redness. Apply a moisturizing lotion or petroleum jelly to the skin on your feet and to dry, brittle toenails. Wear shoes that fit properly and have enough cushioning. If you have foot problems, report any cuts, sores, or bruises to your health care provider immediately. Schedule a complete foot exam at least once a year (annually) or more often if you have foot problems. This information is not intended to replace advice given to you by your health care provider. Make sure you discuss any questions you have with your health care provider. Document Revised: 07/12/2019 Document Reviewed: 07/12/2019 Elsevier Patient Education  2022 [employer].

## 2020-12-19 NOTE — Telephone Encounter (Signed)
Spoke with patient gave a verbal understanding 

## 2020-12-23 ENCOUNTER — Ambulatory Visit (INDEPENDENT_AMBULATORY_CARE_PROVIDER_SITE_OTHER): Payer: Medicare PPO | Admitting: Podiatry

## 2020-12-23 ENCOUNTER — Other Ambulatory Visit: Payer: Self-pay

## 2020-12-23 DIAGNOSIS — E1142 Type 2 diabetes mellitus with diabetic polyneuropathy: Secondary | ICD-10-CM

## 2020-12-23 DIAGNOSIS — E119 Type 2 diabetes mellitus without complications: Secondary | ICD-10-CM

## 2020-12-23 DIAGNOSIS — M25572 Pain in left ankle and joints of left foot: Secondary | ICD-10-CM | POA: Diagnosis not present

## 2020-12-23 DIAGNOSIS — I739 Peripheral vascular disease, unspecified: Secondary | ICD-10-CM | POA: Diagnosis not present

## 2020-12-23 DIAGNOSIS — M25571 Pain in right ankle and joints of right foot: Secondary | ICD-10-CM

## 2020-12-23 DIAGNOSIS — M2011 Hallux valgus (acquired), right foot: Secondary | ICD-10-CM | POA: Diagnosis not present

## 2020-12-23 DIAGNOSIS — M2012 Hallux valgus (acquired), left foot: Secondary | ICD-10-CM

## 2020-12-23 NOTE — Patient Instructions (Signed)
Diabetes Mellitus and Foot Care Foot care is an important part of your health, especially when you have diabetes. Diabetes may cause you to have problems because of poor blood flow (circulation) to your feet and legs, which can cause your skin to: Become thinner and drier. Break more easily. Heal more slowly. Peel and crack. You may also have nerve damage (neuropathy) in your legs and feet, causing decreased feeling in them. This means that you may not notice minor injuries to your feet that could lead to more serious problems. Noticing and addressing any potential problems early is the best way to prevent future foot problems. How to care for your feet Foot hygiene  Wash your feet daily with warm water and mild soap. Do not use hot water. Then, pat your feet and the areas between your toes until they are completely dry. Do not soak your feet as this can dry your skin. Trim your toenails straight across. Do not dig under them or around the cuticle. File the edges of your nails with an emery board or nail file. Apply a moisturizing lotion or petroleum jelly to the skin on your feet and to dry, brittle toenails. Use lotion that does not contain alcohol and is unscented. Do not apply lotion between your toes. Shoes and socks Wear clean socks or stockings every day. Make sure they are not too tight. Do not wear knee-high stockings since they may decrease blood flow to your legs. Wear shoes that fit properly and have enough cushioning. Always look in your shoes before you put them on to be sure there are no objects inside. To break in new shoes, wear them for just a few hours a day. This prevents injuries on your feet. Wounds, scrapes, corns, and calluses  Check your feet daily for blisters, cuts, bruises, sores, and redness. If you cannot see the bottom of your feet, use a mirror or ask someone for help. Do not cut corns or calluses or try to remove them with medicine. If you find a minor scrape,  cut, or break in the skin on your feet, keep it and the skin around it clean and dry. You may clean these areas with mild soap and water. Do not clean the area with peroxide, alcohol, or iodine. If you have a wound, scrape, corn, or callus on your foot, look at it several times a day to make sure it is healing and not infected. Check for: Redness, swelling, or pain. Fluid or blood. Warmth. Pus or a bad smell. General tips Do not cross your legs. This may decrease blood flow to your feet. Do not use heating pads or hot water bottles on your feet. They may burn your skin. If you have lost feeling in your feet or legs, you may not know this is happening until it is too late. Protect your feet from hot and cold by wearing shoes, such as at the beach or on hot pavement. Schedule a complete foot exam at least once a year (annually) or more often if you have foot problems. Report any cuts, sores, or bruises to your health care provider immediately. Where to find more information American Diabetes Association: www.diabetes.org Association of Diabetes Care & Education Specialists: www.diabeteseducator.org Contact a health care provider if: You have a medical condition that increases your risk of infection and you have any cuts, sores, or bruises on your feet. You have an injury that is not healing. You have redness on your legs or feet. You   feel burning or tingling in your legs or feet. You have pain or cramps in your legs and feet. Your legs or feet are numb. Your feet always feel cold. You have pain around any toenails. Get help right away if: You have a wound, scrape, corn, or callus on your foot and: You have pain, swelling, or redness that gets worse. You have fluid or blood coming from the wound, scrape, corn, or callus. Your wound, scrape, corn, or callus feels warm to the touch. You have pus or a bad smell coming from the wound, scrape, corn, or callus. You have a fever. You have a red  line going up your leg. Summary Check your feet every day for blisters, cuts, bruises, sores, and redness. Apply a moisturizing lotion or petroleum jelly to the skin on your feet and to dry, brittle toenails. Wear shoes that fit properly and have enough cushioning. If you have foot problems, report any cuts, sores, or bruises to your health care provider immediately. Schedule a complete foot exam at least once a year (annually) or more often if you have foot problems. This information is not intended to replace advice given to you by your health care provider. Make sure you discuss any questions you have with your health care provider. Document Revised: 07/12/2019 Document Reviewed: 07/12/2019 Elsevier Patient Education  2022 Elsevier Inc.     Peripheral Neuropathy Peripheral neuropathy is a type of nerve damage. It affects nerves that carry signals between the spinal cord and the arms, legs, and the rest of the body (peripheral nerves). It does not affect nerves in the spinal cord or brain. In peripheral neuropathy, one nerve or a group of nerves may be damaged. Peripheral neuropathy is a broad category that includes many specific nerve disorders, like diabetic neuropathy, hereditary neuropathy, and carpal tunnel syndrome. What are the causes? This condition may be caused by: Diabetes. This is the most common cause of peripheral neuropathy. Nerve injury. Pressure or stress on a nerve that lasts a long time. Lack (deficiency) of B vitamins. This can result from alcoholism, poor diet, or a restricted diet. Infections. Autoimmune diseases, such as rheumatoid arthritis and systemic lupus erythematosus. Nerve diseases that are passed from parent to child (inherited). Some medicines, such as cancer medicines (chemotherapy). Poisonous (toxic) substances, such as lead and mercury. Too little blood flowing to the legs. Kidney disease. Thyroid disease. In some cases, the cause of this condition  is not known. What are the signs or symptoms? Symptoms of this condition depend on which of your nerves is damaged. Common symptoms include: Loss of feeling (numbness) in the feet, hands, or both. Tingling in the feet, hands, or both. Burning pain. Very sensitive skin. Weakness. Not being able to move a part of the body (paralysis). Muscle twitching. Clumsiness or poor coordination. Loss of balance. Not being able to control your bladder. Feeling dizzy. Sexual problems. How is this diagnosed? Diagnosing and finding the cause of peripheral neuropathy can be difficult. Your health care provider will take your medical history and do a physical exam. A neurological exam will also be done. This involves checking things that are affected by your brain, spinal cord, and nerves (nervous system). For example, your health care provider will check your reflexes, how you move, and what you can feel. You may have other tests, such as: Blood tests. Electromyogram (EMG) and nerve conduction tests. These tests check nerve function and how well the nerves are controlling the muscles. Imaging tests, such as   CT scans or MRI to rule out other causes of your symptoms. Removing a small piece of nerve to be examined in a lab (nerve biopsy). Removing and examining a small amount of the fluid that surrounds the brain and spinal cord (lumbar puncture). How is this treated? Treatment for this condition may involve: Treating the underlying cause of the neuropathy, such as diabetes, kidney disease, or vitamin deficiencies. Stopping medicines that can cause neuropathy, such as chemotherapy. Medicine to help relieve pain. Medicines may include: Prescription or over-the-counter pain medicine. Antiseizure medicine. Antidepressants. Pain-relieving patches that are applied to painful areas of skin. Surgery to relieve pressure on a nerve or to destroy a nerve that is causing pain. Physical therapy to help improve  movement and balance. Devices to help you move around (assistive devices). Follow these instructions at home: Medicines Take over-the-counter and prescription medicines only as told by your health care provider. Do not take any other medicines without first asking your health care provider. Do not drive or use heavy machinery while taking prescription pain medicine. Lifestyle  Do not use any products that contain nicotine or tobacco, such as cigarettes and e-cigarettes. Smoking keeps blood from reaching damaged nerves. If you need help quitting, ask your health care provider. Avoid or limit alcohol. Too much alcohol can cause a vitamin B deficiency, and vitamin B is needed for healthy nerves. Eat a healthy diet. This includes: Eating foods that are high in fiber, such as fresh fruits and vegetables, whole grains, and beans. Limiting foods that are high in fat and processed sugars, such as fried or sweet foods. General instructions  If you have diabetes, work closely with your health care provider to keep your blood sugar under control. If you have numbness in your feet: Check every day for signs of injury or infection. Watch for redness, warmth, and swelling. Wear padded socks and comfortable shoes. These help protect your feet. Develop a good support system. Living with peripheral neuropathy can be stressful. Consider talking with a mental health specialist or joining a support group. Use assistive devices and attend physical therapy as told by your health care provider. This may include using a walker or a cane. Keep all follow-up visits as told by your health care provider. This is important. Contact a health care provider if: You have new signs or symptoms of peripheral neuropathy. You are struggling emotionally from dealing with peripheral neuropathy. Your pain is not well-controlled. Get help right away if: You have an injury or infection that is not healing normally. You develop  new weakness in an arm or leg. You have fallen or do so frequently. Summary Peripheral neuropathy is when the nerves in the arms, or legs are damaged, resulting in numbness, weakness, or pain. There are many causes of peripheral neuropathy, including diabetes, pinched nerves, vitamin deficiencies, autoimmune disease, and hereditary conditions. Diagnosing and finding the cause of peripheral neuropathy can be difficult. Your health care provider will take your medical history, do a physical exam, and do tests, including blood tests and nerve function tests. Treatment involves treating the underlying cause of the neuropathy and taking medicines to help control pain. Physical therapy and assistive devices may also help. This information is not intended to replace advice given to you by your health care provider. Make sure you discuss any questions you have with your health care provider. Document Revised: 10/02/2019 Document Reviewed: 10/02/2019 Elsevier Patient Education  2022 Elsevier Inc.  

## 2020-12-30 ENCOUNTER — Encounter: Payer: Self-pay | Admitting: Podiatry

## 2020-12-30 NOTE — Progress Notes (Signed)
Subjective: Amy Bartlett presents today referred by Leamon Arnt, MD for diabetic foot evaluation.  Today, patient c/o for diabetic foot evaluation.  Patient relates 10 year history of diabetes.  Patient denies any history of foot wounds.  Patient admits symptoms of numbness, tingling, burning in feet.  Patient does not routinely mointor blood glucose.  PCP is Leamon Arnt, MD , and last visit was 12/08/2020.  She also relates h/o calf/thigh cramping b/l with activity describing symptoms consistent with 1 block claudication. She has lumbar spondylosis as well.   Another concern of bilateral ankle pain as well. Denies any recent episodes of trauma, but ankles are stiff and she would like to have it checked out.  Past Medical History:  Diagnosis Date   Arthritis    Chicken pox    Diabetes mellitus ([place])    Hyperlipidemia    Hypertension    Hyponatremia - HCTZ 07/15/2020   Osteopenia     Patient Active Problem List   Diagnosis Date Noted   Diabetic peripheral neuropathy associated with type 2 diabetes mellitus ([place]) 12/18/2020   Hyponatremia - HCTZ 07/15/2020   Spondylosis of lumbar region without myelopathy or radiculopathy 07/14/2020   Cardiac pacemaker in situ 03/19/2019   CKD (chronic kidney disease) stage 3, GFR 30-59 ml/min (HCC) 10/04/2016   Obesity (BMI 30-39.9) 01/12/2016   NSVT (nonsustained ventricular tachycardia) 12/05/2015   Primary localized osteoarthrosis of left lower leg 07/16/2014   History of complete heart block 10/04/2012   Combined hyperlipidemia associated with type 2 diabetes mellitus ([place]) 11/04/2011   Controlled type 2 diabetes mellitus with stage 3 chronic kidney disease, without long-term current use of insulin ([place]) 11/04/2011   Age-related osteoporosis without fracture 06/01/2011   Vitamin D deficiency 04/16/2011   Hypertension associated with diabetes ([place]) 01/11/2011    Past Surgical History:  Procedure Laterality Date    [place] Bilateral 2009   TUBAL LIGATION      Current Outpatient Medications on File Prior to Visit  Medication Sig Dispense Refill   atorvastatin (LIPITOR) 20 MG tablet Take by mouth.     metFORMIN (GLUCOPHAGE-XR) 750 MG 24 hr tablet Take by mouth.     Alcohol Swabs (B-D SINGLE USE SWABS REGULAR) PADS 1 each by Other route as directed.     alendronate (FOSAMAX) 70 MG tablet TAKE 1 TABLET BY MOUTH EVERY 7 DAYS WITH A FULL GLASS OF WATER ON AN EMPTY STOMACH 12 tablet 0   aspirin (EQ ASPIRIN ADULT LOW DOSE) 81 MG EC tablet Take by mouth.     aspirin 81 MG tablet Take by mouth.     atorvastatin (LIPITOR) 40 MG tablet Take 1 tablet (40 mg total) by mouth daily. 90 tablet 3   blood glucose meter kit and supplies Dispense based on patient and insurance preference. Use up to four times daily as directed. (FOR ICD-10 E10.9, E11.9). 1 each 0   Cholecalciferol 125 MCG (5000 UT) TABS Take by mouth.     diclofenac (VOLTAREN) 75 MG EC tablet Take 1 tablet (75 mg total) by mouth 2 (two) times daily as needed. 30 tablet 1   Dulaglutide (TRULICITY) 1.5 JI/9.6VE SOPN Inject 1.5 mg into the skin once a week. 6 mL 3   empagliflozin (JARDIANCE) 25 MG TABS tablet Take 1 tablet (25 mg total) by mouth daily with breakfast. 90 tablet 3   glucose blood test strip Use  as instructed 100 each 1   lisinopril-hydrochlorothiazide (ZESTORETIC) 20-25 MG tablet Take 1 tablet by mouth daily. 90 tablet 3   metFORMIN (GLUCOPHAGE-XR) 750 MG 24 hr tablet TAKE TWO TABLETS BY MOUTH DAILY WITH BREAKFAST 180 tablet 3   omeprazole (PRILOSEC) 20 MG capsule Take 1 capsule (20 mg total) by mouth daily. 30 capsule 2   No current facility-administered medications on file prior to visit.     No Known Allergies  Social History   Occupational History   Not on file  Tobacco Use   Smoking status: Never   Smokeless tobacco: Never  Vaping Use   Vaping  Use: Never used  Substance and Sexual Activity   Alcohol use: Never   Drug use: Never   Sexual activity: Not Currently    Family History  Problem Relation Age of Onset   Glaucoma Mother    Heart disease Mother        Had Pacemaker, Died on CHF    Hypertension Mother    Osteoporosis Mother    Pneumonia Father    Arthritis Father    Diabetes Brother    Stroke Brother    Healthy Daughter    Healthy Daughter     Immunization History  Administered Date(s) Administered   Fluad Quad(high Dose 65+) 09/08/2018, 11/12/2019, 10/17/2020   PFIZER(Purple Top)SARS-COV-2 Vaccination 02/11/2019, 03/04/2019, 10/15/2019   PNEUMOCOCCAL CONJUGATE-20 12/18/2020   Pfizer Covid-19 Vaccine Bivalent Booster 75yr & up 11/18/2020   Tdap 04/14/2011   Zoster, Live 04/14/2011    Objective: There were no vitals filed for this visit.  ECeasar LundBHeerenis a pleasant 70y.o. female WD, WN in NAD. AAO X 3.  Vascular Examination: CFT immediate b/l LE. Palpable DP/PT pulses b/l LE. Digital hair present b/l. Skin temperature gradient WNL b/l. No pain with calf compression b/l. No edema noted b/l. No cyanosis or clubbing noted b/l LE.  Dermatological Examination: Pedal skin with normal turgor, texture and tone b/l lower extremities. No open wounds b/l LE. No interdigital macerations noted b/l LE. Toenails 1-5 b/l well maintained with adequate length. No erythema, no edema, no drainage, no fluctuance.  Musculoskeletal Examination: Muscle strength 5/5 to all lower extremity muscle groups bilaterally. HAV with bunion deformity noted b/l LE. Limited joint ROM to the bilateral ankles.  Footwear Assessment: Does the patient wear appropriate shoes? Yes. Does the patient need inserts/orthotics? Yes.  Neurological Examination: Protective sensation intact 5/5 intact bilaterally with 10g monofilament b/l. Vibratory sensation intact b/l. Proprioception intact bilaterally.  A1c:   Hemoglobin A1C Latest Ref  Rng & Units 12/18/2020 07/14/2020 04/07/2020 01/02/2020  HGBA1C 4.0 - 5.6 % 7.1(A) 6.5(A) 8.5(A) 7.7(A)  Some recent data might be hidden   Assessment: 1. Encounter for diabetic foot exam ([place]   2. Bilateral ankle pain, unspecified chronicity   3. Hallux valgus, acquired, bilateral   4. Diabetic peripheral neuropathy associated with type 2 diabetes mellitus ([place]   5. Peripheral vascular disease with claudication (HCC)      ADA Risk Categorization: Low Risk:  Patient has all of the following: Intact protective sensation No prior foot ulcer  No severe deformity Pedal pulses present  Plan: -For bilateral ankle pain, referred to Dr. PMarch Rummagefor further workup. For calf/thigh cramping, order written on prescription pad for arterial dopplers USD with toe pressures. She sees Cardiologist with [facility] and will take order to her Cardiologist. -Diabetic foot examination performed today. -Continue diabetic foot care principles: inspect feet daily, monitor glucose as recommended by PCP  and/or Endocrinologist, and follow prescribed diet per PCP, Endocrinologist and/or dietician. -Patient/POA to call should there be question/concern in the interim.  Return in about 1 year (around 12/23/2021).  Marzetta Board, DPM

## 2021-01-09 ENCOUNTER — Ambulatory Visit (INDEPENDENT_AMBULATORY_CARE_PROVIDER_SITE_OTHER): Payer: Medicare PPO | Admitting: Podiatry

## 2021-01-09 ENCOUNTER — Other Ambulatory Visit: Payer: Self-pay

## 2021-01-09 DIAGNOSIS — R29898 Other symptoms and signs involving the musculoskeletal system: Secondary | ICD-10-CM

## 2021-01-09 DIAGNOSIS — M47816 Spondylosis without myelopathy or radiculopathy, lumbar region: Secondary | ICD-10-CM

## 2021-01-09 DIAGNOSIS — R269 Unspecified abnormalities of gait and mobility: Secondary | ICD-10-CM

## 2021-01-09 DIAGNOSIS — M25371 Other instability, right ankle: Secondary | ICD-10-CM | POA: Diagnosis not present

## 2021-01-12 NOTE — Progress Notes (Signed)
°  Subjective:  Patient ID: Amy Bartlett, female    DOB: 07-20-1950,  MRN: [id number]  Chief Complaint  Patient presents with   ankle instability    Right foot/ankle tends to roll or "give out" from time to time - not painful - Dr. Eloy End referral  \ 71 y.o. female presents with the above complaint. History confirmed with patient. States her right ankle rolls. Does not complain of pain in the right ankle.  Objective:  Physical Exam: warm, good capillary refill, no trophic changes or ulcerative lesions, normal DP and PT pulses, and normal sensory exam.  Right Foot: weakness DF/ev consistent with dropfoot. No POP about the ankle. Neg anterior drawer. Neg talar tilt.  Assessment:   1. Weakness of right lower extremity   2. Spondylosis of lumbar region without myelopathy or radiculopathy   3. Gait disturbance   4. Ankle instability, right      Plan:  Patient was evaluated and treated and all questions answered.  RLE Instability/weakness. -She is weak in DF and Eversion. I think she has some element of radiculopathy.  -She would benefit from lower back workup - consider MRI of lumbar spine. Will defer to PCP for this.  -Should issues persist could consider bracing.  Return in about 2 months (around 03/09/2021) for RLE instability/weakness f/u.

## 2021-01-21 ENCOUNTER — Other Ambulatory Visit: Payer: Self-pay | Admitting: Family Medicine

## 2021-01-21 DIAGNOSIS — Z1231 Encounter for screening mammogram for malignant neoplasm of breast: Secondary | ICD-10-CM

## 2021-02-02 ENCOUNTER — Encounter (HOSPITAL_COMMUNITY): Payer: Self-pay | Admitting: Emergency Medicine

## 2021-02-02 ENCOUNTER — Emergency Department (HOSPITAL_COMMUNITY)
Admission: EM | Admit: 2021-02-02 | Discharge: 2021-02-02 | Disposition: A | Discharge status: Home / Self Care | Payer: Medicare PPO | Attending: Emergency Medicine | Admitting: Emergency Medicine

## 2021-02-02 ENCOUNTER — Telehealth: Payer: Self-pay

## 2021-02-02 ENCOUNTER — Emergency Department (HOSPITAL_COMMUNITY): Payer: Medicare PPO

## 2021-02-02 ENCOUNTER — Other Ambulatory Visit: Payer: Self-pay

## 2021-02-02 DIAGNOSIS — G8929 Other chronic pain: Secondary | ICD-10-CM | POA: Insufficient documentation

## 2021-02-02 DIAGNOSIS — Z79899 Other long term (current) drug therapy: Secondary | ICD-10-CM | POA: Insufficient documentation

## 2021-02-02 DIAGNOSIS — M25571 Pain in right ankle and joints of right foot: Secondary | ICD-10-CM | POA: Diagnosis not present

## 2021-02-02 DIAGNOSIS — I10 Essential (primary) hypertension: Secondary | ICD-10-CM | POA: Diagnosis not present

## 2021-02-02 DIAGNOSIS — Z7982 Long term (current) use of aspirin: Secondary | ICD-10-CM | POA: Diagnosis not present

## 2021-02-02 DIAGNOSIS — E119 Type 2 diabetes mellitus without complications: Secondary | ICD-10-CM | POA: Diagnosis not present

## 2021-02-02 DIAGNOSIS — Z7984 Long term (current) use of oral hypoglycemic drugs: Secondary | ICD-10-CM | POA: Insufficient documentation

## 2021-02-02 DIAGNOSIS — Z794 Long term (current) use of insulin: Secondary | ICD-10-CM | POA: Insufficient documentation

## 2021-02-02 DIAGNOSIS — M7989 Other specified soft tissue disorders: Secondary | ICD-10-CM | POA: Diagnosis not present

## 2021-02-02 NOTE — Progress Notes (Signed)
Orthopedic Tech Progress Note Patient Details:  Amy Bartlett 09/03/1950 [id number]  Ortho Devices Type of Ortho Device: CAM walker Ortho Device/Splint Location: RLE Ortho Device/Splint Interventions: Ordered, Application, Adjustment   Post Interventions Patient Tolerated: Well Instructions Provided: Care of device, Adjustment of device  Trinna Post 02/02/2021, 9:22 PM

## 2021-02-02 NOTE — ED Notes (Signed)
Ortho called regarding placement of CAM boot. Per ortho, they will be down in a couple of minutes.

## 2021-02-02 NOTE — Telephone Encounter (Signed)
See note

## 2021-02-02 NOTE — Telephone Encounter (Signed)
FYI

## 2021-02-02 NOTE — ED Provider Triage Note (Signed)
Emergency Medicine Provider Triage Evaluation Note  Amy Bartlett , a 71 y.o. female  was evaluated in triage.  Pt complains of right ankle pain.  Patient states her symptoms have been going on for about 5 years, but got worse this past Thursday.  She denies any known injury.  She states that she intermittently has burning pain in that right foot, no numbness.  She has been trying diclofenac with no relief.  Review of Systems  Positive: Right ankle pain Negative: Numbness, tingling  Physical Exam  BP 108/81    Pulse 85    Temp 97.7 F (36.5 C) (Oral)    Resp 16    SpO2 97%  Gen:   Awake, no distress   Resp:  Normal effort  MSK:   Moves extremities without difficulty  Other:    Medical Decision Making  Medically screening exam initiated at 12:53 PM.  Appropriate orders placed.  Amy Bartlett was informed that the remainder of the evaluation will be completed by another provider, this initial triage assessment does not replace that evaluation, and the importance of remaining in the ED until their evaluation is complete.     Amy Fallas T, PA-C 02/02/21 1258

## 2021-02-02 NOTE — Telephone Encounter (Signed)
Pt states she is currently at ED. Will call back after she is discharged.

## 2021-02-02 NOTE — Telephone Encounter (Signed)
Patient has called stating Ventura Sellers DPM, has informed her that he was going to reach out to Dr. Mardelle Matte to ask her to order an MRI of patients foot.    Patient states she is was in a lot of pain and needed MRI completed today.  I have advised patient to seek treatment at ED if she felt she could not wait for order to be entered and date scheduled.

## 2021-02-02 NOTE — ED Notes (Signed)
Discharge instructions reviewed with patient and family. Patient and family verbalized understanding of instructions. Follow-up care and medications were reviewed. Patient ambulatory with steady gait. VSS upon discharge.  °

## 2021-02-02 NOTE — ED Triage Notes (Addendum)
Patient here for evaluation of right ankle pain that started four-to-five years ago, then got worse this passed Thursday, denies known recent injury. Patient alert, oriented, and in no apparent distress at this time.

## 2021-02-02 NOTE — Telephone Encounter (Signed)
Please schedule appointment with PCP.

## 2021-02-02 NOTE — Discharge Instructions (Signed)
Your work-up in the ER this evening was reassuring for acute abnormalities.  As we discussed, you will need to have an MRI of your foot to further evaluate causes of your pain.  However, it is sufficient that this be done outpatient and be scheduled by your primary doctor.  I recommend that you call them tomorrow to facilitate this.  In the interim, I have given you a walking boot to assist with ambulation and pain.  You may also take Tylenol as needed for pain.  I also recommend rest, ice, and elevation as you are able.  Return if development of any new or worsening symptoms.

## 2021-02-02 NOTE — ED Provider Notes (Signed)
[name] EMERGENCY DEPARTMENT Provider Note   CSN: [id number] Arrival date & time: 02/02/21  1201     History  Chief Complaint  Patient presents with   Ankle Pain    Amy Bartlett is a 71 y.o. female.  NoPatient with history of diabetes and hypertension presents today with chief complaint of right foot pain. She states that same began 5 years ago but has had increased pain since Thursday. She states that she is unsure if she injured it 5 years ago, however states that she did use to run a lot of 5ks and probably had some sort of injury associated with running all the time. She states that she remembers someone offering her surgery which she declined and states 'maybe it was for that ankle, I cant remember.' She does state that she has not had a recent injury to the foot. States that she is still able to ambulate with some pain.  Erythematous or warm, low suspicion for septic arthritis or gout at this time.  The history is provided by the patient. No language interpreter was used.  Ankle Pain Associated symptoms: no fever       Home Medications Prior to Admission medications   Medication Sig Start Date End Date Taking? Authorizing Provider  Alcohol Swabs (B-D SINGLE USE SWABS REGULAR) PADS 1 each by Other route as directed. 03/23/19   [provider]  alendronate (FOSAMAX) 70 MG tablet TAKE 1 TABLET BY MOUTH EVERY 7 DAYS WITH A FULL GLASS OF WATER ON AN EMPTY STOMACH 12/18/20   Leamon Arnt, MD  aspirin (EQ ASPIRIN ADULT LOW DOSE) 81 MG EC tablet Take by mouth.    [provider]  aspirin 81 MG tablet Take by mouth.    [provider]  atorvastatin (LIPITOR) 20 MG tablet Take by mouth. 05/25/18   [provider]  atorvastatin (LIPITOR) 40 MG tablet Take 1 tablet (40 mg total) by mouth daily. 01/10/20   Leamon Arnt, MD  blood glucose meter kit and supplies Dispense based on patient and insurance preference. Use up  to four times daily as directed. (FOR ICD-10 E10.9, E11.9). 12/16/20   Leamon Arnt, MD  Cholecalciferol 125 MCG (5000 UT) TABS Take by mouth. 11/05/11   [provider]  diclofenac (VOLTAREN) 75 MG EC tablet Take 1 tablet (75 mg total) by mouth 2 (two) times daily as needed. 07/15/20   Leamon Arnt, MD  Dulaglutide (TRULICITY) 1.5 TT/0.1XB SOPN Inject 1.5 mg into the skin once a week. 12/18/20   Leamon Arnt, MD  empagliflozin (JARDIANCE) 25 MG TABS tablet Take 1 tablet (25 mg total) by mouth daily with breakfast. 12/18/20   Leamon Arnt, MD  glucose blood test strip Use as instructed 03/22/19   Leamon Arnt, MD  lisinopril-hydrochlorothiazide (ZESTORETIC) 20-25 MG tablet Take 1 tablet by mouth daily. 12/18/20   Leamon Arnt, MD  metFORMIN (GLUCOPHAGE-XR) 750 MG 24 hr tablet TAKE TWO TABLETS BY MOUTH DAILY WITH BREAKFAST 12/18/20   Leamon Arnt, MD  metFORMIN (GLUCOPHAGE-XR) 750 MG 24 hr tablet Take by mouth. 06/26/20   [provider]  omeprazole (PRILOSEC) 20 MG capsule Take 1 capsule (20 mg total) by mouth daily. 12/18/20   Leamon Arnt, MD      Allergies    Patient has no known allergies.    Review of Systems   Review of Systems  Constitutional:  Negative for chills and fever.  Gastrointestinal:  Negative for diarrhea, nausea and vomiting.  Musculoskeletal:  Positive for arthralgias. Negative for gait problem.  All other systems reviewed and are negative.  Physical Exam Updated Vital Signs BP 125/83 (BP Location: Left Arm)    Pulse 85    Temp 97.7 F (36.5 C) (Oral)    Resp 16    Ht 5' 2"  (1.575 m)    Wt 86.2 kg    SpO2 100%    BMI 34.75 kg/m  Physical Exam Vitals and nursing note reviewed.  Constitutional:      General: She is not in acute distress.    Appearance: Normal appearance. She is normal weight. She is not ill-appearing, toxic-appearing or diaphoretic.     Comments: Patient resting comfortably in bed in no acute distress  HENT:      Head: Normocephalic and atraumatic.  Eyes:     Extraocular Movements: Extraocular movements intact.     Pupils: Pupils are equal, round, and reactive to light.  Cardiovascular:     Rate and Rhythm: Normal rate.  Pulmonary:     Effort: Pulmonary effort is normal. No respiratory distress.  Musculoskeletal:     Cervical back: Normal range of motion.     Comments: Tenderness to palpation of anterior medial malleolus and into the dorsum of the right foot with some swelling present. No erythema or warmth noted. DP and PT pulses intact and 2+  Skin:    General: Skin is warm and dry.     Capillary Refill: Capillary refill takes less than 2 seconds.  Neurological:     General: No focal deficit present.     Mental Status: She is alert.  Psychiatric:        Mood and Affect: Mood normal.        Behavior: Behavior normal.    ED Results / Procedures / Treatments   Labs (all labs ordered are listed, but only abnormal results are displayed) Labs Reviewed - No data to display  EKG None  Radiology DG Ankle Complete Right  Result Date: 02/02/2021 CLINICAL DATA:  Right ankle lateral pain. EXAM: RIGHT ANKLE - COMPLETE 3+ VIEW COMPARISON:  None. FINDINGS: Soft tissue swelling in the right ankle particularly along the medial malleolus. The right ankle is located without acute fracture. Irregularity along the medial malleolus is compatible with chronic changes. Focal lucency along the medial aspect the talar dome. Prominent calcaneal spurring. IMPRESSION: 1. Focal lucency along the medial aspect of the talus near the dome. This is concerning for an underlying osteochondral lesion. This could be better characterized with MRI. 2. Negative for acute fracture or dislocation. 3. Evidence for mild soft tissue swelling. 4. Calcaneal spurring. Electronically Signed   By: Markus Daft M.D.   On: 02/02/2021 13:39    Procedures Procedures    Medications Ordered in ED Medications - No data to display  ED  Course/ Medical Decision Making/ A&P                           Medical Decision Making Amount and/or Complexity of Data Reviewed Radiology: ordered.  Patient presents today with 5 years of right foot pain that has worsened since Thursday with ability to ambulate with some pain.  I, Lavonna Rua, PA-C, personally reviewed and evaluated these images supported by medical decision making  DG right ankle reveals 1. Focal lucency along the medial aspect of the talus near the dome. This is concerning for an underlying  osteochondral lesion. This could be better characterized with MRI. 2. Negative for acute fracture or dislocation. 3. Evidence for mild soft tissue swelling. 4. Calcaneal spurring.  Patient X-Ray negative for obvious fracture or dislocation.  She is afebrile, nontoxic-appearing, and in no acute distress with reassuring vital signs.  The joint is mildly edematous but not erythematous or warm, low suspicion for septic arthritis or gout at this time. No emergent needs for MRI at this time, patient advised to follow up with PCP to schedule this. Patient given walking boot while in ED, conservative therapy recommended and discussed. Patient will be dc home & is agreeable with above plan.  Educated on red flag symptoms of prompt immediate return.  Discharged in stable condition.   This is a shared visit with supervising physician Dr. Alvino Chapel who has independently evaluated patient & provided guidance in evaluation/management/disposition, in agreement with care    Final Clinical Impression(s) / ED Diagnoses Final diagnoses:  Chronic pain of right ankle    Rx / DC Orders ED Discharge Orders     None     An After Visit Summary was printed and given to the patient.     Nestor Lewandowsky 02/02/21 2100    Davonna Belling, MD 02/03/21 [phone]

## 2021-02-03 ENCOUNTER — Telehealth: Payer: Self-pay | Admitting: Family Medicine

## 2021-02-03 NOTE — Telephone Encounter (Signed)
Pt has an appt with Dr Jonni Sanger on 02/04/21 at 11:30am.  Client [place] [place] [facility] [place] Night Provider Billey Chang- MD Contact Type Call Who Is Calling Patient / Member / Family / Caregiver Caller Name [place] Phone Number [phone] Patient Name Amy Bartlett Patient DOB [birth year] Call Type Message Only Information Provided Reason for Call Request to Schedule Office Appointment Initial Comment Caller states they need to schedule an appt. Patient request to speak to RN No Additional Comment Office hours provided. Disp. Time Disposition Final User 02/02/2021 6:47:30 PM General Information Provided Yes Benetta Spar Call Closed By: Benetta Spar Transaction Date/Time: 02/02/2021 6:44:37 PM (ET)

## 2021-02-04 ENCOUNTER — Encounter: Payer: Self-pay | Admitting: Family Medicine

## 2021-02-04 ENCOUNTER — Other Ambulatory Visit: Payer: Self-pay

## 2021-02-04 ENCOUNTER — Ambulatory Visit (INDEPENDENT_AMBULATORY_CARE_PROVIDER_SITE_OTHER): Payer: Medicare PPO | Admitting: Family Medicine

## 2021-02-04 VITALS — BP 113/79 | HR 96 | Temp 97.0°F | Ht 62.0 in | Wt 192.6 lb

## 2021-02-04 DIAGNOSIS — G8929 Other chronic pain: Secondary | ICD-10-CM | POA: Diagnosis not present

## 2021-02-04 DIAGNOSIS — M25571 Pain in right ankle and joints of right foot: Secondary | ICD-10-CM

## 2021-02-04 DIAGNOSIS — M5441 Lumbago with sciatica, right side: Secondary | ICD-10-CM | POA: Diagnosis not present

## 2021-02-04 NOTE — Patient Instructions (Signed)
Please follow up as scheduled for your next visit with me: 03/10/2021   I have ordered an MRI of your right ankle: we will call you to get you scheduled.  I have referred your back to [facility] to further evaluate and treat your ankle pain. Also, they can assess anything further that is needed for your back pain and ankle weakness.    If you have any questions or concerns, please don't hesitate to send me a message via MyChart or call the office at [phone]. Thank you for visiting with [place] today! It's our pleasure caring for you.

## 2021-02-04 NOTE — Progress Notes (Signed)
Subjective  CC:  Chief Complaint  Patient presents with   Ankle Injury    Needs  MRI for right ankle    HPI: Amy Bartlett is a 71 y.o. female who presents to the office today to address the problems listed above in the chief complaint. I reviewed recent podiatry and ER notes: reviewed my notes and xrays from April 2022.  Patient reports acute onset of medial right ankle pain 4 to 5 days ago.  No injury.  Pain with weightbearing.  Unable to walk normally.  Emergency room visit revealed a tender medial malleolus.  X-ray revealed soft tissue swelling and a bony lesion of the medial ankle concerning for osteochondral mass.  She was placed in a fracture boot and given diclofenac.  Fortunately these things have helped her pain.  She continues to have pain with weightbearing if she is not wearing the boot. Chronic low back pain and feeling of ankle weakness.  Podiatry notes showed weakness with dorsiflexion and eversion.  Patient has intermittent low back pain.  X-rays from last year revealed DJD changes.  She did have physical therapy however feels it was not very helpful in changing her pain.  Occasionally she will feel like her ankle is weak but will easily roll.  She does not have significant back pain at this time.  Podiatry was worried about a radiculopathy.  Assessment  1. Acute right ankle pain   2. Chronic midline low back pain with right-sided sciatica      Plan  Acute ankle pain with abnormal ankle x-ray: Need ankle MRI to further define bony mass of medial malleolus.  Patient is tender in that area.  Continue fracture boot and as needed NSAIDs/Tylenol.  Refer to orthopedics for further treatment options. Chronic midline low back pain with intermittent sciatica: Current exam does not show significant weakness in the foot.  Needs further evaluation.  Refer to orthopedics for further evaluation and to identify need to look for radiculopathy.  Follow up: As scheduled for  diabetes and hypertension 03/10/2021  Orders Placed This Encounter  Procedures   MR ANKLE RIGHT WO CONTRAST   Ambulatory referral to Orthopedic Surgery   No orders of the defined types were placed in this encounter.     I reviewed the patients updated PMH, FH, and SocHx.    Patient Active Problem List   Diagnosis Date Noted   Cardiac pacemaker in situ 03/19/2019    Priority: High   CKD (chronic kidney disease) stage 3, GFR 30-59 ml/min (HCC) 10/04/2016    Priority: High   NSVT (nonsustained ventricular tachycardia) 12/05/2015    Priority: High   History of complete heart block 10/04/2012    Priority: High   Combined hyperlipidemia associated with type 2 diabetes mellitus ([place]) 11/04/2011    Priority: High   Controlled type 2 diabetes mellitus with stage 3 chronic kidney disease, without long-term current use of insulin ([place]) 11/04/2011    Priority: High   Hypertension associated with diabetes ([place]) 01/11/2011    Priority: High   Spondylosis of lumbar region without myelopathy or radiculopathy 07/14/2020    Priority: Medium    Obesity (BMI 30-39.9) 01/12/2016    Priority: Medium    Primary localized osteoarthrosis of left lower leg 07/16/2014    Priority: Medium    Age-related osteoporosis without fracture 06/01/2011    Priority: Medium    Hyponatremia - HCTZ 07/15/2020    Priority: Low   Vitamin D deficiency 04/16/2011  Priority: Low   Diabetic peripheral neuropathy associated with type 2 diabetes mellitus ([place]) 12/18/2020   Current Meds  Medication Sig   Alcohol Swabs (B-D SINGLE USE SWABS REGULAR) PADS 1 each by Other route as directed.   aspirin 81 MG EC tablet Take by mouth.   aspirin 81 MG tablet Take by mouth.   blood glucose meter kit and supplies Dispense based on patient and insurance preference. Use up to four times daily as directed. (FOR ICD-10 E10.9, E11.9).   diclofenac (VOLTAREN) 75 MG EC tablet Take 1 tablet (75 mg total) by mouth 2 (two) times daily  as needed.   Dulaglutide (TRULICITY) 1.5 FK/8.1EX SOPN Inject 1.5 mg into the skin once a week.   empagliflozin (JARDIANCE) 25 MG TABS tablet Take 1 tablet (25 mg total) by mouth daily with breakfast.   glucose blood test strip Use as instructed   lisinopril-hydrochlorothiazide (ZESTORETIC) 20-25 MG tablet Take 1 tablet by mouth daily.   metFORMIN (GLUCOPHAGE-XR) 750 MG 24 hr tablet TAKE TWO TABLETS BY MOUTH DAILY WITH BREAKFAST   metFORMIN (GLUCOPHAGE-XR) 750 MG 24 hr tablet Take by mouth.   omeprazole (PRILOSEC) 20 MG capsule Take 1 capsule (20 mg total) by mouth daily.    Allergies: Patient has No Known Allergies. Family History: Patient family history includes Arthritis in her father; Diabetes in her brother; Glaucoma in her mother; Healthy in her daughter and daughter; Heart disease in her mother; Hypertension in her mother; Osteoporosis in her mother; Pneumonia in her father; Stroke in her brother. Social History:  Patient  reports that she has never smoked. She has never used smokeless tobacco. She reports that she does not drink alcohol and does not use drugs.  Review of Systems: Constitutional: Negative for fever malaise or anorexia Cardiovascular: negative for chest pain Respiratory: negative for SOB or persistent cough Gastrointestinal: negative for abdominal pain  Objective  Vitals: BP 113/79 (BP Location: Left Arm, Patient Position: Sitting, Cuff Size: Normal)    Pulse 96    Temp (!) 97 F (36.1 C) (Temporal)    Ht _0  (1.575 m)    Wt 192 lb 9.6 oz (87.4 kg)    SpO2 97%    BMI 35.23 kg/m  General: no acute distress , A&Ox3 Right ankle: Tender over medial malleolus, full range of motion.  No lateral ankle tenderness.  No tendon tenderness.  No midfoot tenderness.  Normal strength throughout.  Patient is able to walk on her heel. +2 DTRs on right Normal strength and right lower extremity    Commons side effects, risks, benefits, and alternatives for medications and  treatment plan prescribed today were discussed, and the patient expressed understanding of the given instructions. Patient is instructed to call or message via MyChart if he/she has any questions or concerns regarding our treatment plan. No barriers to understanding were identified. We discussed Red Flag symptoms and signs in detail. Patient expressed understanding regarding what to do in case of urgent or emergency type symptoms.  Medication list was reconciled, printed and provided to the patient in AVS. Patient instructions and summary information was reviewed with the patient as documented in the AVS. This note was prepared with assistance of Dragon voice recognition software. Occasional wrong-word or sound-a-like substitutions may have occurred due to the inherent limitations of voice recognition software  This visit occurred during the SARS-CoV-2 public health emergency.  Safety protocols were in place, including screening questions prior to the visit, additional usage of staff PPE, and extensive  cleaning of exam room while observing appropriate contact time as indicated for disinfecting solutions.  ° °

## 2021-02-04 NOTE — Telephone Encounter (Signed)
See note

## 2021-02-13 DIAGNOSIS — M25571 Pain in right ankle and joints of right foot: Secondary | ICD-10-CM | POA: Diagnosis not present

## 2021-02-16 ENCOUNTER — Telehealth: Payer: Self-pay

## 2021-02-16 NOTE — Telephone Encounter (Signed)
Patient called in and states she called [facility] imaging and the stated they cannot do her MRI since she has a pacemaker she would have to go to the hospital to get this done. Patient states they told her that she could have a CT done at there office. Patient is unsure what she needs to do. Please advise.

## 2021-02-17 ENCOUNTER — Other Ambulatory Visit: Payer: Self-pay | Admitting: Orthopedic Surgery

## 2021-02-17 DIAGNOSIS — M25571 Pain in right ankle and joints of right foot: Secondary | ICD-10-CM

## 2021-02-17 NOTE — Telephone Encounter (Signed)
Spoke with patient, gave a verbalized understanding.

## 2021-02-20 ENCOUNTER — Ambulatory Visit
Admission: RE | Admit: 2021-02-20 | Discharge: 2021-02-20 | Disposition: A | Discharge status: Home / Self Care | Payer: Medicare PPO | Source: Ambulatory Visit | Attending: Orthopedic Surgery | Admitting: Orthopedic Surgery

## 2021-02-20 DIAGNOSIS — M7731 Calcaneal spur, right foot: Secondary | ICD-10-CM | POA: Diagnosis not present

## 2021-02-20 DIAGNOSIS — M19071 Primary osteoarthritis, right ankle and foot: Secondary | ICD-10-CM | POA: Diagnosis not present

## 2021-02-20 DIAGNOSIS — M25571 Pain in right ankle and joints of right foot: Secondary | ICD-10-CM

## 2021-02-20 DIAGNOSIS — M958 Other specified acquired deformities of musculoskeletal system: Secondary | ICD-10-CM | POA: Diagnosis not present

## 2021-02-24 ENCOUNTER — Ambulatory Visit
Admission: RE | Admit: 2021-02-24 | Discharge: 2021-02-24 | Disposition: A | Discharge status: Home / Self Care | Payer: Medicare PPO | Source: Ambulatory Visit | Attending: Family Medicine | Admitting: Family Medicine

## 2021-02-24 DIAGNOSIS — Z1231 Encounter for screening mammogram for malignant neoplasm of breast: Secondary | ICD-10-CM | POA: Diagnosis not present

## 2021-03-03 DIAGNOSIS — E119 Type 2 diabetes mellitus without complications: Secondary | ICD-10-CM | POA: Diagnosis not present

## 2021-03-03 DIAGNOSIS — H04123 Dry eye syndrome of bilateral lacrimal glands: Secondary | ICD-10-CM | POA: Diagnosis not present

## 2021-03-03 DIAGNOSIS — H2513 Age-related nuclear cataract, bilateral: Secondary | ICD-10-CM | POA: Diagnosis not present

## 2021-03-03 LAB — HM DIABETES EYE EXAM

## 2021-03-06 ENCOUNTER — Encounter: Payer: Self-pay | Admitting: Family Medicine

## 2021-03-10 ENCOUNTER — Other Ambulatory Visit: Payer: Self-pay

## 2021-03-10 ENCOUNTER — Encounter: Payer: Self-pay | Admitting: Family Medicine

## 2021-03-10 ENCOUNTER — Ambulatory Visit (INDEPENDENT_AMBULATORY_CARE_PROVIDER_SITE_OTHER): Payer: Medicare PPO | Admitting: Family Medicine

## 2021-03-10 VITALS — BP 108/72 | HR 85 | Temp 98.0°F | Ht 62.0 in | Wt 193.6 lb

## 2021-03-10 DIAGNOSIS — I152 Hypertension secondary to endocrine disorders: Secondary | ICD-10-CM

## 2021-03-10 DIAGNOSIS — I739 Peripheral vascular disease, unspecified: Secondary | ICD-10-CM | POA: Diagnosis not present

## 2021-03-10 DIAGNOSIS — Z6835 Body mass index (BMI) 35.0-35.9, adult: Secondary | ICD-10-CM | POA: Diagnosis not present

## 2021-03-10 DIAGNOSIS — N1831 Chronic kidney disease, stage 3a: Secondary | ICD-10-CM

## 2021-03-10 DIAGNOSIS — E1122 Type 2 diabetes mellitus with diabetic chronic kidney disease: Secondary | ICD-10-CM

## 2021-03-10 DIAGNOSIS — E1159 Type 2 diabetes mellitus with other circulatory complications: Secondary | ICD-10-CM | POA: Diagnosis not present

## 2021-03-10 DIAGNOSIS — N1832 Chronic kidney disease, stage 3b: Secondary | ICD-10-CM | POA: Diagnosis not present

## 2021-03-10 DIAGNOSIS — E1142 Type 2 diabetes mellitus with diabetic polyneuropathy: Secondary | ICD-10-CM | POA: Diagnosis not present

## 2021-03-10 DIAGNOSIS — I10 Essential (primary) hypertension: Secondary | ICD-10-CM

## 2021-03-10 DIAGNOSIS — I442 Atrioventricular block, complete: Secondary | ICD-10-CM | POA: Diagnosis not present

## 2021-03-10 MED ORDER — SHINGRIX 50 MCG/0.5ML IM SUSR: 0.5000 mL | Freq: Once | INTRAMUSCULAR | 0 refills | Status: DC

## 2021-03-10 MED ORDER — ROSUVASTATIN CALCIUM 10 MG PO TABS: 10.0000 mg | ORAL_TABLET | Freq: Every day | ORAL | 3 refills | Status: DC

## 2021-03-10 NOTE — Patient Instructions (Signed)
Please return in 6 months to recheck diabetes and cholesterol  ? ?Please start crestor nightly for your cholesterol. ?Please restart your fosamax weekly. ?You may stop the metformin.  ? ?If you have any questions or concerns, please don't hesitate to send me a message via MyChart or call the office at [phone]. Thank you for visiting with [place] today! It's our pleasure caring for you.  ?

## 2021-03-10 NOTE — Progress Notes (Signed)
Subjective  CC:  Chief Complaint  Patient presents with   Diabetes   Hypertension   Hyperlipidemia    HPI: Amy Bartlett is a 71 y.o. female who presents to the office today for follow up of diabetes and problems listed above in the chief complaint.  Diabetes follow up: Her diabetic control is reported as Improved. She stopped metformin for unclear reasongs. On trulicity 1.5mg  weekly (increased in dec) and jardiance 25 daily. Tolerating well. She denies exertional CP or SOB or symptomatic hypoglycemia. She denies foot sores or paresthesias.  Right ankle pain: reviewed ortho notes. Awaiting mri. Treated for gout flare with pred. CT showed talar dome arthritis.  CKD and HLD: reviewed labs from 12/2020. At goal on lipitor but stopped. Myalgias have improved off atorvastatin. On sglt2-I; sees nephrology.   Wt Readings from Last 3 Encounters:  03/10/21 193 lb 9.6 oz (87.8 kg)  02/04/21 192 lb 9.6 oz (87.4 kg)  02/02/21 190 lb (86.2 kg)    BP Readings from Last 3 Encounters:  03/10/21 108/72  02/04/21 113/79  02/02/21 115/82    Assessment  1. Type 2 diabetes mellitus with stage 3b chronic kidney disease, without long-term current use of insulin (HCC)   2. Class 2 severe obesity due to excess calories with serious comorbidity and body mass index (BMI) of 35.0 to 35.9 in adult ([place])   3. Diabetic peripheral neuropathy associated with type 2 diabetes mellitus (HCC) Chronic  4. Stage 3a chronic kidney disease ([place])   5. Peripheral vascular disease with claudication ([place])   6. Hypertension associated with diabetes ([place])      Plan  Diabetes is currently very well controlled. Continue jardiacne 25 and trulicity 1.5 weekly. D/c metformin. Continue diet.  Monitoring ckd HLD: trial of crestor instead of lipitor.  No claudication now. On asa Bp remains well controlled.    Follow up: 3 mo to recheck diabetes. No orders of the defined types were placed in this  encounter.  Meds ordered this encounter  Medications   Zoster Vaccine Adjuvanted [facility]) injection    Sig: Inject 0.5 mLs into the muscle once for 1 dose. Please give 2nd dose 2-6 months after first dose    Dispense:  2 each    Refill:  0   rosuvastatin (CRESTOR) 10 MG tablet    Sig: Take 1 tablet (10 mg total) by mouth daily.    Dispense:  90 tablet    Refill:  3      Immunization History  Administered Date(s) Administered   Fluad Quad(high Dose 65+) 09/08/2018, 11/12/2019, 10/17/2020   PFIZER(Purple Top)SARS-COV-2 Vaccination 02/11/2019, 03/04/2019, 10/15/2019   PNEUMOCOCCAL CONJUGATE-20 12/18/2020   Pfizer Covid-19 Vaccine Bivalent Booster 70yrs & up 11/18/2020   Tdap 04/14/2011   Zoster, Live 04/14/2011    Diabetes Related Lab Review: Lab Results  Component Value Date   HGBA1C 7.1 (A) 12/18/2020   HGBA1C 6.5 (A) 07/14/2020   HGBA1C 8.5 (A) 04/07/2020    Lab Results  Component Value Date   MICROALBUR <0.7 12/18/2020   Lab Results  Component Value Date   CREATININE 1.45 (H) 12/18/2020   BUN 30 (H) 12/18/2020   NA 135 12/18/2020   K 4.6 12/18/2020   CL 100 12/18/2020   CO2 28 12/18/2020   Lab Results  Component Value Date   CHOL 124 12/18/2020   CHOL 149 07/14/2020   CHOL 219 (H) 01/02/2020   Lab Results  Component Value Date   HDL 48.10 12/18/2020  HDL 54.30 07/14/2020   HDL 55.70 01/02/2020   Lab Results  Component Value Date   LDLCALC 59 12/18/2020   LDLCALC 70 07/14/2020   LDLCALC 77 12/21/2018   Lab Results  Component Value Date   TRIG 84.0 12/18/2020   TRIG 122.0 07/14/2020   TRIG 259.0 (H) 01/02/2020   Lab Results  Component Value Date   CHOLHDL 3 12/18/2020   CHOLHDL 3 07/14/2020   CHOLHDL 4 01/02/2020   Lab Results  Component Value Date   LDLDIRECT 134.0 01/02/2020   The ASCVD Risk score (Arnett DK, et al., 2019) failed to calculate for the following reasons:   The valid total cholesterol range is 130 to 320 mg/dL I have  reviewed the PMH, Fam and Soc history. Patient Active Problem List   Diagnosis Date Noted   Cardiac pacemaker in situ 03/19/2019    Priority: High    Added automatically from request for surgery [id number]    CKD (chronic kidney disease) stage 3, GFR 30-59 ml/min (HCC) 10/04/2016    Priority: High   NSVT (nonsustained ventricular tachycardia) 12/05/2015    Priority: High    Noted on PPM.  Echo on 08/2015 EF normal     History of complete heart block 10/04/2012    Priority: High    Overview:  Dr. Georg Ruddle, S/p pacemaker, stable 2014/cla No ischemia by stress testing 2011    Combined hyperlipidemia associated with type 2 diabetes mellitus ([place]) 11/04/2011    Priority: High   Type 2 diabetes mellitus with stage 3b chronic kidney disease, without long-term current use of insulin ([place]) 11/04/2011    Priority: High   Hypertension associated with diabetes ([place]) 01/11/2011    Priority: High   Spondylosis of lumbar region without myelopathy or radiculopathy 07/14/2020    Priority: Medium    Obesity (BMI 30-39.9) 01/12/2016    Priority: Medium    Primary localized osteoarthrosis of left lower leg 07/16/2014    Priority: Medium    Age-related osteoporosis without fracture 06/01/2011    Priority: Medium     Dexa 2022: T = -1.6 lowest, improved on fosamax x 3 years. Continue x 2 more years and recheck dexa 2024. Drug holiday if stable at that time. T = -2.6 04/2017 in L hip. New dx: started fosamax 06/2017 T = -1.2 05/2011; recheck at age 40.    Hyponatremia - HCTZ 07/15/2020    Priority: Low   Vitamin D deficiency 04/16/2011    Priority: Low   Peripheral vascular disease with claudication ([place]) 03/10/2021   Class 2 severe obesity due to excess calories with serious comorbidity and body mass index (BMI) of 35.0 to 35.9 in adult [facility]) 03/10/2021   Diabetic peripheral neuropathy associated with type 2 diabetes mellitus ([place]) 12/18/2020    Social History: Patient  reports that she has never  smoked. She has never used smokeless tobacco. She reports that she does not drink alcohol and does not use drugs.  Review of Systems: Ophthalmic: negative for eye pain, loss of vision or double vision Cardiovascular: negative for chest pain Respiratory: negative for SOB or persistent cough Gastrointestinal: negative for abdominal pain Genitourinary: negative for dysuria or gross hematuria MSK: negative for foot lesions Neurologic: negative for weakness or gait disturbance  Objective  Vitals: BP 108/72    Pulse 85    Temp 98 F (36.7 C) (Temporal)    Ht 5\' 2"  (1.575 m)    Wt 193 lb 9.6 oz (87.8 kg)    SpO2 100%  BMI 35.41 kg/m  General: well appearing, no acute distress  Psych:  Alert and oriented, normal mood and affect HEENT:  Normocephalic, atraumatic, moist mucous membranes, supple neck  Cardiovascular:  Nl S1 and S2, RRR without murmur, gallop or rub. no edema Respiratory:  Good breath sounds bilaterally, CTAB with normal effort, no rales     Diabetic education: ongoing education regarding chronic disease management for diabetes was given today. We continue to reinforce the ABC's of diabetic management: A1c (<7 or 8 dependent upon patient), tight blood pressure control, and cholesterol management with goal LDL < 100 minimally. We discuss diet strategies, exercise recommendations, medication options and possible side effects. At each visit, we review recommended immunizations and preventive care recommendations for diabetics and stress that good diabetic control can prevent other problems. See below for this patient's data.   Commons side effects, risks, benefits, and alternatives for medications and treatment plan prescribed today were discussed, and the patient expressed understanding of the given instructions. Patient is instructed to call or message via MyChart if he/she has any questions or concerns regarding our treatment plan. No barriers to understanding were identified. We  discussed Red Flag symptoms and signs in detail. Patient expressed understanding regarding what to do in case of urgent or emergency type symptoms.  Medication list was reconciled, printed and provided to the patient in AVS. Patient instructions and summary information was reviewed with the patient as documented in the AVS. This note was prepared with assistance of Dragon voice recognition software. Occasional wrong-word or sound-a-like substitutions may have occurred due to the inherent limitations of voice recognition software  This visit occurred during the SARS-CoV-2 public health emergency.  Safety protocols were in place, including screening questions prior to the visit, additional usage of staff PPE, and extensive cleaning of exam room while observing appropriate contact time as indicated for disinfecting solutions.

## 2021-03-13 ENCOUNTER — Ambulatory Visit: Payer: Medicare PPO | Admitting: Podiatry

## 2021-06-16 DIAGNOSIS — I442 Atrioventricular block, complete: Secondary | ICD-10-CM | POA: Diagnosis not present

## 2021-07-28 DIAGNOSIS — M79672 Pain in left foot: Secondary | ICD-10-CM | POA: Diagnosis not present

## 2021-08-11 DIAGNOSIS — M722 Plantar fascial fibromatosis: Secondary | ICD-10-CM | POA: Diagnosis not present

## 2021-08-11 DIAGNOSIS — M2022 Hallux rigidus, left foot: Secondary | ICD-10-CM | POA: Diagnosis not present

## 2021-08-11 DIAGNOSIS — M7662 Achilles tendinitis, left leg: Secondary | ICD-10-CM | POA: Diagnosis not present

## 2021-08-11 DIAGNOSIS — R29898 Other symptoms and signs involving the musculoskeletal system: Secondary | ICD-10-CM | POA: Diagnosis not present

## 2021-09-04 DIAGNOSIS — M79672 Pain in left foot: Secondary | ICD-10-CM | POA: Diagnosis not present

## 2021-09-04 DIAGNOSIS — M545 Low back pain, unspecified: Secondary | ICD-10-CM | POA: Diagnosis not present

## 2021-09-14 DIAGNOSIS — M5416 Radiculopathy, lumbar region: Secondary | ICD-10-CM | POA: Diagnosis not present

## 2021-09-14 DIAGNOSIS — Z950 Presence of cardiac pacemaker: Secondary | ICD-10-CM | POA: Diagnosis not present

## 2021-09-15 ENCOUNTER — Ambulatory Visit (INDEPENDENT_AMBULATORY_CARE_PROVIDER_SITE_OTHER): Payer: Medicare PPO | Admitting: Family Medicine

## 2021-09-15 ENCOUNTER — Encounter: Payer: Self-pay | Admitting: Family Medicine

## 2021-09-15 VITALS — BP 100/60 | HR 88 | Temp 97.9°F | Ht 62.0 in | Wt 193.0 lb

## 2021-09-15 DIAGNOSIS — E1142 Type 2 diabetes mellitus with diabetic polyneuropathy: Secondary | ICD-10-CM

## 2021-09-15 DIAGNOSIS — E1169 Type 2 diabetes mellitus with other specified complication: Secondary | ICD-10-CM | POA: Diagnosis not present

## 2021-09-15 DIAGNOSIS — M1712 Unilateral primary osteoarthritis, left knee: Secondary | ICD-10-CM | POA: Diagnosis not present

## 2021-09-15 DIAGNOSIS — N1831 Chronic kidney disease, stage 3a: Secondary | ICD-10-CM | POA: Diagnosis not present

## 2021-09-15 DIAGNOSIS — E1159 Type 2 diabetes mellitus with other circulatory complications: Secondary | ICD-10-CM

## 2021-09-15 DIAGNOSIS — M19079 Primary osteoarthritis, unspecified ankle and foot: Secondary | ICD-10-CM

## 2021-09-15 DIAGNOSIS — E1122 Type 2 diabetes mellitus with diabetic chronic kidney disease: Secondary | ICD-10-CM | POA: Diagnosis not present

## 2021-09-15 DIAGNOSIS — E782 Mixed hyperlipidemia: Secondary | ICD-10-CM

## 2021-09-15 DIAGNOSIS — N1832 Chronic kidney disease, stage 3b: Secondary | ICD-10-CM

## 2021-09-15 DIAGNOSIS — I152 Hypertension secondary to endocrine disorders: Secondary | ICD-10-CM | POA: Diagnosis not present

## 2021-09-15 LAB — POCT GLYCOSYLATED HEMOGLOBIN (HGB A1C): Hemoglobin A1C: 6.7 % — AB (ref 4.0–5.6)

## 2021-09-15 NOTE — Progress Notes (Signed)
Subjective  CC:  Chief Complaint  Patient presents with   Diabetes   Hyperlipidemia    HPI: Amy Bartlett is a 71 y.o. female who presents to the office today for follow up of diabetes and problems listed above in the chief complaint.  Diabetes follow up: Her diabetic control is reported as Unchanged. Doing well on trulicity and jardiance.  She denies exertional CP or SOB or symptomatic hypoglycemia. She denies foot sores or significant paresthesias. Rare tingling in gabapentin 300mg  daily. Eye exam current. Imms up to date. On ace and statin.  HTN: well controlled on zestoretic. No cp. Denies claudication.  HLD on statin and has been well controlled tolerates well C/o toe pain: swollen toe. Seeing ortho for right leg pain as well. For back MRI. No redness or warmth.   Wt Readings from Last 3 Encounters:  09/15/21 193 lb (87.5 kg)  03/10/21 193 lb 9.6 oz (87.8 kg)  02/04/21 192 lb 9.6 oz (87.4 kg)    BP Readings from Last 3 Encounters:  09/15/21 100/60  03/10/21 108/72  02/04/21 113/79    Assessment  1. Type 2 diabetes mellitus with stage 3b chronic kidney disease, without long-term current use of insulin ([place])   2. Hypertension associated with diabetes ([place])   3. Stage 3a chronic kidney disease ([place])   4. Combined hyperlipidemia associated with type 2 diabetes mellitus ([place])   5. Diabetic peripheral neuropathy associated with type 2 diabetes mellitus ([place])   6. Osteoarthritis of toe   7. Primary localized osteoarthrosis of left lower leg      Plan  Diabetes is currently very well controlled. Continue same.  HTN is very well controlled.  HLD is at goal Discussed DJD: spine and ankle and toe. Tylenol and appropriate foot wear discussed.  Continue gabapentin: controlling mild diabetic neuropathy well. Await lumbar MRI: h/o lumbar stenosis and djd  Follow up: 3 mo for cpe and recheck. Orders Placed This Encounter  Procedures   POCT HgB A1C   No orders of  the defined types were placed in this encounter.     Immunization History  Administered Date(s) Administered   Fluad Quad(high Dose 65+) 09/08/2018, 11/12/2019, 10/17/2020   PFIZER(Purple Top)SARS-COV-2 Vaccination 02/11/2019, 03/04/2019, 10/15/2019   PNEUMOCOCCAL CONJUGATE-20 12/18/2020   Pfizer Covid-19 Vaccine Bivalent Booster 58yrs & up 11/18/2020   Tdap 04/14/2011   Zoster Recombinat (Shingrix) 02/04/2021   Zoster, Live 04/14/2011    Diabetes Related Lab Review: Lab Results  Component Value Date   HGBA1C 6.7 (A) 09/15/2021   HGBA1C 7.1 (A) 12/18/2020   HGBA1C 6.5 (A) 07/14/2020    Lab Results  Component Value Date   MICROALBUR <0.7 12/18/2020   Lab Results  Component Value Date   CREATININE 1.45 (H) 12/18/2020   BUN 30 (H) 12/18/2020   NA 135 12/18/2020   K 4.6 12/18/2020   CL 100 12/18/2020   CO2 28 12/18/2020   Lab Results  Component Value Date   CHOL 124 12/18/2020   CHOL 149 07/14/2020   CHOL 219 (H) 01/02/2020   Lab Results  Component Value Date   HDL 48.10 12/18/2020   HDL 54.30 07/14/2020   HDL 55.70 01/02/2020   Lab Results  Component Value Date   LDLCALC 59 12/18/2020   LDLCALC 70 07/14/2020   LDLCALC 77 12/21/2018   Lab Results  Component Value Date   TRIG 84.0 12/18/2020   TRIG 122.0 07/14/2020   TRIG 259.0 (H) 01/02/2020   Lab Results  Component Value Date   CHOLHDL 3 12/18/2020   CHOLHDL 3 07/14/2020   CHOLHDL 4 01/02/2020   Lab Results  Component Value Date   LDLDIRECT 134.0 01/02/2020   The ASCVD Risk score (Arnett DK, et al., 2019) failed to calculate for the following reasons:   The valid total cholesterol range is 130 to 320 mg/dL I have reviewed the PMH, Fam and Soc history. Patient Active Problem List   Diagnosis Date Noted   Class 2 severe obesity due to excess calories with serious comorbidity and body mass index (BMI) of 35.0 to 35.9 in adult [facility]) 03/10/2021    Priority: High   Diabetic peripheral neuropathy  associated with type 2 diabetes mellitus (HCC) 12/18/2020    Priority: High   Cardiac pacemaker in situ 03/19/2019    Priority: High    Added automatically from request for surgery [id number]    CKD (chronic kidney disease) stage 3, GFR 30-59 ml/min (HCC) 10/04/2016    Priority: High   NSVT (nonsustained ventricular tachycardia) 12/05/2015    Priority: High    Noted on PPM.  Echo on 08/2015 EF normal     History of complete heart block 10/04/2012    Priority: High    Overview:  Dr. Mayme Genta, S/p pacemaker, stable 2014/cla No ischemia by stress testing 2011    Combined hyperlipidemia associated with type 2 diabetes mellitus (HCC) 11/04/2011    Priority: High   Type 2 diabetes mellitus with stage 3b chronic kidney disease, without long-term current use of insulin (HCC) 11/04/2011    Priority: High   Hypertension associated with diabetes (HCC) 01/11/2011    Priority: High   Spondylosis of lumbar region without myelopathy or radiculopathy 07/14/2020    Priority: Medium    Obesity (BMI 30-39.9) 01/12/2016    Priority: Medium    Primary localized osteoarthrosis of left lower leg 07/16/2014    Priority: Medium     Talar dome, toe    Age-related osteoporosis without fracture 06/01/2011    Priority: Medium     Dexa 2022: T = -1.6 lowest, improved on fosamax x 3 years. Continue x 2 more years and recheck dexa 2024. Drug holiday if stable at that time. T = -2.6 04/2017 in L hip. New dx: started fosamax 06/2017 T = -1.2 05/2011; recheck at age 44.    Hyponatremia - HCTZ 07/15/2020    Priority: Low   Vitamin D deficiency 04/16/2011    Priority: Low    Social History: Patient  reports that she has never smoked. She has never used smokeless tobacco. She reports that she does not drink alcohol and does not use drugs.  Review of Systems: Ophthalmic: negative for eye pain, loss of vision or double vision Cardiovascular: negative for chest pain Respiratory: negative for SOB or persistent  cough Gastrointestinal: negative for abdominal pain Genitourinary: negative for dysuria or gross hematuria MSK: negative for foot lesions Neurologic: negative for weakness or gait disturbance  Objective  Vitals: BP 100/60   Pulse 88   Temp 97.9 F (36.6 C)   Ht 5\' 2"  (1.575 m)   Wt 193 lb (87.5 kg)   SpO2 95%   BMI 35.30 kg/m  General: well appearing, no acute distress  Psych:  Alert and oriented, normal mood and affect HEENT:  Normocephalic, atraumatic, moist mucous membranes, supple neck  Cardiovascular:  Nl S1 and S2, RRR without murmur, gallop or rub. no edema Respiratory:  Good breath sounds bilaterally, CTAB with normal effort, no rales Gastrointestinal: normal  BS, soft, nontender Skin:  Warm, no rashes Neurologic:   Mental status is normal. normal gait Foot exam: no erythema, pallor, or cyanosis visible nl proprioception and sensation to monofilament testing bilaterally, +2 distal pulses bilaterally    Diabetic education: ongoing education regarding chronic disease management for diabetes was given today. We continue to reinforce the ABC's of diabetic management: A1c (<7 or 8 dependent upon patient), tight blood pressure control, and cholesterol management with goal LDL < 100 minimally. We discuss diet strategies, exercise recommendations, medication options and possible side effects. At each visit, we review recommended immunizations and preventive care recommendations for diabetics and stress that good diabetic control can prevent other problems. See below for this patient's data.   Commons side effects, risks, benefits, and alternatives for medications and treatment plan prescribed today were discussed, and the patient expressed understanding of the given instructions. Patient is instructed to call or message via MyChart if he/she has any questions or concerns regarding our treatment plan. No barriers to understanding were identified. We discussed Red Flag symptoms and signs  in detail. Patient expressed understanding regarding what to do in case of urgent or emergency type symptoms.  Medication list was reconciled, printed and provided to the patient in AVS. Patient instructions and summary information was reviewed with the patient as documented in the AVS. This note was prepared with assistance of Dragon voice recognition software. Occasional wrong-word or sound-a-like substitutions may have occurred due to the inherent limitations of voice recognition software  This visit occurred during the SARS-CoV-2 public health emergency.  Safety protocols were in place, including screening questions prior to the visit, additional usage of staff PPE, and extensive cleaning of exam room while observing appropriate contact time as indicated for disinfecting solutions.

## 2021-09-15 NOTE — Patient Instructions (Signed)
Please return in 3 months for your annual complete physical; please come fasting.   Glad you are doing well.   If you have any questions or concerns, please don't hesitate to send me a message via MyChart or call the office at [phone]. Thank you for visiting with us today! It's our pleasure caring for you.  

## 2021-09-23 ENCOUNTER — Other Ambulatory Visit (HOSPITAL_COMMUNITY): Payer: Self-pay | Admitting: Anesthesiology

## 2021-09-23 ENCOUNTER — Other Ambulatory Visit: Payer: Self-pay | Admitting: Anesthesiology

## 2021-09-23 DIAGNOSIS — M545 Low back pain, unspecified: Secondary | ICD-10-CM

## 2021-09-23 DIAGNOSIS — I442 Atrioventricular block, complete: Secondary | ICD-10-CM | POA: Diagnosis not present

## 2021-09-28 DIAGNOSIS — N183 Chronic kidney disease, stage 3 unspecified: Secondary | ICD-10-CM | POA: Diagnosis not present

## 2021-09-28 DIAGNOSIS — M5416 Radiculopathy, lumbar region: Secondary | ICD-10-CM | POA: Diagnosis not present

## 2021-10-13 ENCOUNTER — Ambulatory Visit (HOSPITAL_COMMUNITY)
Admission: RE | Admit: 2021-10-13 | Discharge: 2021-10-13 | Disposition: A | Discharge status: Home / Self Care | Payer: Medicare PPO | Source: Ambulatory Visit | Attending: Anesthesiology | Admitting: Anesthesiology

## 2021-10-13 DIAGNOSIS — M545 Low back pain, unspecified: Secondary | ICD-10-CM | POA: Diagnosis not present

## 2021-10-13 DIAGNOSIS — M4316 Spondylolisthesis, lumbar region: Secondary | ICD-10-CM | POA: Diagnosis not present

## 2021-10-22 DIAGNOSIS — M479 Spondylosis, unspecified: Secondary | ICD-10-CM | POA: Diagnosis not present

## 2021-10-22 DIAGNOSIS — M48062 Spinal stenosis, lumbar region with neurogenic claudication: Secondary | ICD-10-CM | POA: Diagnosis not present

## 2021-10-22 DIAGNOSIS — M545 Low back pain, unspecified: Secondary | ICD-10-CM | POA: Diagnosis not present

## 2021-10-30 ENCOUNTER — Ambulatory Visit (INDEPENDENT_AMBULATORY_CARE_PROVIDER_SITE_OTHER): Payer: Medicare PPO

## 2021-10-30 VITALS — BP 112/64 | HR 89 | Temp 97.7°F | Wt 194.4 lb

## 2021-10-30 DIAGNOSIS — Z0000 Encounter for general adult medical examination without abnormal findings: Secondary | ICD-10-CM

## 2021-10-30 DIAGNOSIS — Z23 Encounter for immunization: Secondary | ICD-10-CM

## 2021-10-30 NOTE — Patient Instructions (Signed)
Amy Bartlett , Thank you for taking time to come for your Medicare Wellness Visit. I appreciate your ongoing commitment to your health goals. Please review the following plan we discussed and let me know if I can assist you in the future.   These are the goals we discussed:  Goals      Patient Stated     Lose weight         This is a list of the screening recommended for you and due dates:  Health Maintenance  Topic Date Due   COVID-19 Vaccine (5 - Pfizer series) 03/18/2021   Zoster (Shingles) Vaccine (2 of 2) 04/01/2021   Flu Shot  04/04/2022*   Tetanus Vaccine  09/16/2022*   Yearly kidney function blood test for diabetes  12/18/2021   Yearly kidney health urinalysis for diabetes  12/18/2021   Mammogram  02/24/2022   Eye exam for diabetics  03/03/2022   Hemoglobin A1C  03/16/2022   Complete foot exam   09/16/2022   DEXA scan (bone density measurement)  09/19/2022   Medicare Annual Wellness Visit  10/31/2022   Colon Cancer Screening  02/22/2027   Pneumonia Vaccine  Completed   HPV Vaccine  Aged Out   Hepatitis C Screening: USPSTF Recommendation to screen - Ages 79-79 yo.  Discontinued  *Topic was postponed. The date shown is not the original due date.    Advanced directives: Advance directive discussed with you today. Even though you declined this today please call our office should you change your mind and we can give you the proper paperwork for you to fill out.  Conditions/risks identified: work on losing weight   Next appointment: Follow up in one year for your annual wellness visit    Preventive Care 69 Years and Older, Female Preventive care refers to lifestyle choices and visits with your health care provider that can promote health and wellness. What does preventive care include? A yearly physical exam. This is also called an annual well check. Dental exams once or twice a year. Routine eye exams. Ask your health care provider how often you should have your eyes  checked. Personal lifestyle choices, including: Daily care of your teeth and gums. Regular physical activity. Eating a healthy diet. Avoiding tobacco and drug use. Limiting alcohol use. Practicing safe sex. Taking low-dose aspirin every day. Taking vitamin and mineral supplements as recommended by your health care provider. What happens during an annual well check? The services and screenings done by your health care provider during your annual well check will depend on your age, overall health, lifestyle risk factors, and family history of disease. Counseling  Your health care provider may ask you questions about your: Alcohol use. Tobacco use. Drug use. Emotional well-being. Home and relationship well-being. Sexual activity. Eating habits. History of falls. Memory and ability to understand (cognition). Work and work Statistician. Reproductive health. Screening  You may have the following tests or measurements: Height, weight, and BMI. Blood pressure. Lipid and cholesterol levels. These may be checked every 5 years, or more frequently if you are over 34 years old. Skin check. Lung cancer screening. You may have this screening every year starting at age 18 if you have a 30-pack-year history of smoking and currently smoke or have quit within the past 15 years. Fecal occult blood test (FOBT) of the stool. You may have this test every year starting at age 47. Flexible sigmoidoscopy or colonoscopy. You may have a sigmoidoscopy every 5 years or a colonoscopy every  10 years starting at age 38. Hepatitis C blood test. Hepatitis B blood test. Sexually transmitted disease (STD) testing. Diabetes screening. This is done by checking your blood sugar (glucose) after you have not eaten for a while (fasting). You may have this done every 1-3 years. Bone density scan. This is done to screen for osteoporosis. You may have this done starting at age 85. Mammogram. This may be done every 1-2  years. Talk to your health care provider about how often you should have regular mammograms. Talk with your health care provider about your test results, treatment options, and if necessary, the need for more tests. Vaccines  Your health care provider may recommend certain vaccines, such as: Influenza vaccine. This is recommended every year. Tetanus, diphtheria, and acellular pertussis (Tdap, Td) vaccine. You may need a Td booster every 10 years. Zoster vaccine. You may need this after age 36. Pneumococcal 13-valent conjugate (PCV13) vaccine. One dose is recommended after age 41. Pneumococcal polysaccharide (PPSV23) vaccine. One dose is recommended after age 67. Talk to your health care provider about which screenings and vaccines you need and how often you need them. This information is not intended to replace advice given to you by your health care provider. Make sure you discuss any questions you have with your health care provider. Document Released: 01/17/2015 Document Revised: 09/10/2015 Document Reviewed: 10/22/2014 Elsevier Interactive Patient Education  2017 [place] Prevention in the Home Falls can cause injuries. They can happen to people of all ages. There are many things you can do to make your home safe and to help prevent falls. What can I do on the outside of my home? Regularly fix the edges of walkways and driveways and fix any cracks. Remove anything that might make you trip as you walk through a door, such as a raised step or threshold. Trim any bushes or trees on the path to your home. Use bright outdoor lighting. Clear any walking paths of anything that might make someone trip, such as rocks or tools. Regularly check to see if handrails are loose or broken. Make sure that both sides of any steps have handrails. Any raised decks and porches should have guardrails on the edges. Have any leaves, snow, or ice cleared regularly. Use sand or salt on walking paths  during winter. Clean up any spills in your garage right away. This includes oil or grease spills. What can I do in the bathroom? Use night lights. Install grab bars by the toilet and in the tub and shower. Do not use towel bars as grab bars. Use non-skid mats or decals in the tub or shower. If you need to sit down in the shower, use a plastic, non-slip stool. Keep the floor dry. Clean up any water that spills on the floor as soon as it happens. Remove soap buildup in the tub or shower regularly. Attach bath mats securely with double-sided non-slip rug tape. Do not have throw rugs and other things on the floor that can make you trip. What can I do in the bedroom? Use night lights. Make sure that you have a light by your bed that is easy to reach. Do not use any sheets or blankets that are too big for your bed. They should not hang down onto the floor. Have a firm chair that has side arms. You can use this for support while you get dressed. Do not have throw rugs and other things on the floor that can make you  trip. What can I do in the kitchen? Clean up any spills right away. Avoid walking on wet floors. Keep items that you use a lot in easy-to-reach places. If you need to reach something above you, use a strong step stool that has a grab bar. Keep electrical cords out of the way. Do not use floor polish or wax that makes floors slippery. If you must use wax, use non-skid floor wax. Do not have throw rugs and other things on the floor that can make you trip. What can I do with my stairs? Do not leave any items on the stairs. Make sure that there are handrails on both sides of the stairs and use them. Fix handrails that are broken or loose. Make sure that handrails are as long as the stairways. Check any carpeting to make sure that it is firmly attached to the stairs. Fix any carpet that is loose or worn. Avoid having throw rugs at the top or bottom of the stairs. If you do have throw  rugs, attach them to the floor with carpet tape. Make sure that you have a light switch at the top of the stairs and the bottom of the stairs. If you do not have them, ask someone to add them for you. What else can I do to help prevent falls? Wear shoes that: Do not have high heels. Have rubber bottoms. Are comfortable and fit you well. Are closed at the toe. Do not wear sandals. If you use a stepladder: Make sure that it is fully opened. Do not climb a closed stepladder. Make sure that both sides of the stepladder are locked into place. Ask someone to hold it for you, if possible. Clearly mark and make sure that you can see: Any grab bars or handrails. First and last steps. Where the edge of each step is. Use tools that help you move around (mobility aids) if they are needed. These include: Canes. Walkers. Scooters. Crutches. Turn on the lights when you go into a dark area. Replace any light bulbs as soon as they burn out. Set up your furniture so you have a clear path. Avoid moving your furniture around. If any of your floors are uneven, fix them. If there are any pets around you, be aware of where they are. Review your medicines with your doctor. Some medicines can make you feel dizzy. This can increase your chance of falling. Ask your doctor what other things that you can do to help prevent falls. This information is not intended to replace advice given to you by your health care provider. Make sure you discuss any questions you have with your health care provider. Document Released: 10/17/2008 Document Revised: 05/29/2015 Document Reviewed: 01/25/2014 Elsevier Interactive Patient Education  2017 [employer].

## 2021-10-30 NOTE — Progress Notes (Addendum)
 Subjective:   Amy Bartlett is a 71 y.o. female who presents for Medicare Annual (Subsequent) preventive examination.  Review of Systems     Cardiac Risk Factors include: advanced age (>55men, >65 women);hypertension;diabetes mellitus;dyslipidemia;obesity (BMI >30kg/m2)     Objective:    Today's Vitals   10/30/21 0930  BP: 112/64  Pulse: 89  Temp: 97.7 F (36.5 C)  SpO2: 96%  Weight: 194 lb 6.4 oz (88.2 kg)   Body mass index is 35.56 kg/m.     10/30/2021    9:40 AM 02/02/2021    8:20 PM 10/17/2020   10:23 AM 08/04/2020    8:56 AM 10/04/2018   12:52 PM  Advanced Directives  Does Patient Have a Medical Advance Directive? No No No No No  Would patient like information on creating a medical advance directive? No - Patient declined  Yes (MAU/Ambulatory/Procedural Areas - Information given) Yes (MAU/Ambulatory/Procedural Areas - Information given) Yes (MAU/Ambulatory/Procedural Areas - Information given)    Current Medications (verified) Outpatient Encounter Medications as of 10/30/2021  Medication Sig   alendronate (FOSAMAX) 70 MG tablet TAKE 1 TABLET BY MOUTH EVERY 7 DAYS WITH A FULL GLASS OF WATER ON AN EMPTY STOMACH   aspirin 81 MG EC tablet Take by mouth.   blood glucose meter kit and supplies Dispense based on patient and insurance preference. Use up to four times daily as directed. (FOR ICD-10 E10.9, E11.9).   Cholecalciferol 125 MCG (5000 UT) TABS Take by mouth.   diclofenac Sodium (VOLTAREN) 1 % GEL    Dulaglutide (TRULICITY) 1.5 MG/0.5ML SOPN Inject 1.5 mg into the skin once a week.   empagliflozin (JARDIANCE) 25 MG TABS tablet Take 1 tablet (25 mg total) by mouth daily with breakfast.   gabapentin (NEURONTIN) 300 MG capsule Take 300 mg by mouth as needed.   glucose blood test strip Use as instructed   lidocaine (LIDODERM) 5 % SMARTSIG:Patch(s) Topical   lisinopril-hydrochlorothiazide (ZESTORETIC) 20-25 MG tablet Take 1 tablet by mouth daily.    rosuvastatin (CRESTOR) 10 MG tablet Take 1 tablet (10 mg total) by mouth daily.   tiZANidine (ZANAFLEX) 2 MG tablet Take 2 mg by mouth as needed.   omeprazole (PRILOSEC) 20 MG capsule Take 1 capsule (20 mg total) by mouth daily. (Patient not taking: Reported on 10/30/2021)   No facility-administered encounter medications on file as of 10/30/2021.    Allergies (verified) Patient has no known allergies.   History: Past Medical History:  Diagnosis Date   Arthritis    Chicken pox    Diabetes mellitus (HCC)    Hyperlipidemia    Hypertension    Hyponatremia - HCTZ 07/15/2020   Osteopenia    Past Surgical History:  Procedure Laterality Date   CARDIAC PACEMAKER PLACEMENT     CESAREAN SECTION  1974, 1977, 1979   REDUCTION MAMMAPLASTY Bilateral 2009   TUBAL LIGATION     Family History  Problem Relation Age of Onset   Glaucoma Mother    Heart disease Mother        Had Pacemaker, Died on CHF    Hypertension Mother    Osteoporosis Mother    Pneumonia Father    Arthritis Father    Diabetes Brother    Stroke Brother    Healthy Daughter    Healthy Daughter    Social History   Socioeconomic History   Marital status: Married    Spouse name: Not on file   Number of children: Not on file   Years   of education: Not on file   Highest education level: Not on file  Occupational History   Not on file  Tobacco Use   Smoking status: Never   Smokeless tobacco: Never  Vaping Use   Vaping Use: Never used  Substance and Sexual Activity   Alcohol use: Never   Drug use: Never   Sexual activity: Not Currently  Other Topics Concern   Not on file  Social History Narrative   Has 3 children- 2 living in [place] and 1 in [place]    Social Determinants of Health   Financial Resource Strain: Low Risk  (10/30/2021)   Overall Financial Resource Strain (CARDIA)    Difficulty of Paying Living Expenses: Not hard at all  Food Insecurity: No Food Insecurity (10/30/2021)   Hunger Vital Sign     Worried About Running Out of Food in the Last Year: Never true    Ran Out of Food in the Last Year: Never true  Transportation Needs: No Transportation Needs (10/30/2021)   PRAPARE - Transportation    Lack of Transportation (Medical): No    Lack of Transportation (Non-Medical): No  Physical Activity: Inactive (10/30/2021)   Exercise Vital Sign    Days of Exercise per Week: 0 days    Minutes of Exercise per Session: 0 min  Stress: No Stress Concern Present (10/30/2021)   [employer] - Occupational Stress Questionnaire    Feeling of Stress : Not at all  Social Connections: Moderately Integrated (10/30/2021)   Social Connection and Isolation Panel [NHANES]    Frequency of Communication with Friends and Family: More than three times a week    Frequency of Social Gatherings with Friends and Family: More than three times a week    Attends Religious Services: More than 4 times per year    Active Member of Clubs or Organizations: No    Attends Club or Organization Meetings: Never    Marital Status: Married    Tobacco Counseling Counseling given: Not Answered   Clinical Intake:  Pre-visit preparation completed: Yes  Pain : No/denies pain     BMI - recorded: 35.56 Nutritional Status: BMI > 30  Obese Nutritional Risks: None Diabetes: Yes CBG done?: No Did pt. bring in CBG monitor from home?: No  How often do you need to have someone help you when you read instructions, pamphlets, or other written materials from your doctor or pharmacy?: 1 - Never  Diabetic?Nutrition Risk Assessment:  Has the patient had any N/V/D within the last 2 months?  No  Does the patient have any non-healing wounds?  No  Has the patient had any unintentional weight loss or weight gain?  No   Diabetes:  Is the patient diabetic?  Yes  If diabetic, was a CBG obtained today?  No  Did the patient bring in their glucometer from home?  No  How often do you monitor your  CBG's? N/A.   Financial Strains and Diabetes Management:  Are you having any financial strains with the device, your supplies or your medication? No .  Does the patient want to be seen by Chronic Care Management for management of their diabetes?  No  Would the patient like to be referred to a Nutritionist or for Diabetic Management?  No   Diabetic Exams:  Diabetic Eye Exam: Completed 03/03/21 Diabetic Foot Exam: Completed 09/15/21   Interpreter Needed?: No  Information entered by :: Tina Betterson, LPN   Activities of Daily Living      10/30/2021    9:42 AM  In your present state of health, do you have any difficulty performing the following activities:  Hearing? 0  Vision? 0  Difficulty concentrating or making decisions? 0  Walking or climbing stairs? 0  Dressing or bathing? 0  Doing errands, shopping? 0  Preparing Food and eating ? N  Using the Toilet? N  In the past six months, have you accidently leaked urine? Y  Comment at times  Do you have problems with loss of bowel control? N  Managing your Medications? N  Managing your Finances? N  Housekeeping or managing your Housekeeping? N    Patient Care Team: Leamon Arnt, MD as PCP - General (Family Medicine) Clent Jacks, MD as Consulting Physician (Ophthalmology) Felicity Pellegrini Tonna Corner, MD as Referring Physician (Nephrology) Georg Ruddle Ashok Cordia, MD as Referring Physician (Cardiology) Dr. Gerlene Burdock (Dentistry)  Indicate any recent Medical Services you may have received from other than [facility] providers in the past year (date may be approximate).     Assessment:   This is a routine wellness examination for [place].  Hearing/Vision screen Hearing Screening - Comments:: Pt denies any hearing issues  Vision Screening - Comments:: Pt follows up with Dr Katy Fitch for annul eye exams   Dietary issues and exercise activities discussed: Current Exercise Habits: The patient does not participate in regular exercise at present    Goals Addressed             This Visit's Progress    Patient Stated       Work on losing weight        Depression Screen    10/30/2021    9:39 AM 09/15/2021   10:01 AM 10/17/2020   10:22 AM 01/02/2020    2:16 PM 10/04/2018   12:52 PM 05/25/2018    1:09 PM 06/28/2017    9:58 AM  PHQ 2/9 Scores  PHQ - 2 Score 0 0 0 0 0 0 0    Fall Risk    10/30/2021    9:41 AM 09/15/2021   10:00 AM 10/17/2020   10:24 AM 04/23/2019    9:03 AM 10/04/2018   12:52 PM  Fall Risk   Falls in the past year? 0 0 0 0 0  Number falls in past yr: 0 0 0 0 0  Injury with Fall? 0 0 0 0 0  Risk for fall due to : Impaired vision;Impaired balance/gait;Impaired mobility No Fall Risks Impaired vision    Follow up Falls prevention discussed Falls evaluation completed Falls prevention discussed  Falls evaluation completed;Education provided;Falls prevention discussed    FALL RISK PREVENTION PERTAINING TO THE HOME:  Any stairs in or around the home? Yes  If so, are there any without handrails? No  Home free of loose throw rugs in walkways, pet beds, electrical cords, etc? Yes  Adequate lighting in your home to reduce risk of falls? Yes   ASSISTIVE DEVICES UTILIZED TO PREVENT FALLS:  Life alert? No  Use of a cane, walker or w/c? No  Grab bars in the bathroom? No  Shower chair or bench in shower? No  Elevated toilet seat or a handicapped toilet? No   TIMED UP AND GO:  Was the test performed? Yes .  Length of time to ambulate 10 feet: 10 sec.   Gait steady and fast without use of assistive device  Cognitive Function:    10/04/2018   12:53 PM  MMSE - Mini Mental State Exam  Orientation to time  5  Orientation to Place 5  Registration 3  Attention/ Calculation 5  Recall 3  Language- name 2 objects 2  Language- repeat 1  Language- follow 3 step command 3  Language- read & follow direction 1  Write a sentence 1  Copy design 1  Total score 30        10/30/2021    9:43 AM 10/17/2020   10:26  AM  6CIT Screen  What Year? 0 points 0 points  What month? 0 points 0 points  What time? 0 points 0 points  Count back from 20 0 points 0 points  Months in reverse 0 points 0 points  Repeat phrase 0 points 2 points  Total Score 0 points 2 points    Immunizations Immunization History  Administered Date(s) Administered   Fluad Quad(high Dose 65+) 09/08/2018, 11/12/2019, 10/17/2020, 10/30/2021   PFIZER(Purple Top)SARS-COV-2 Vaccination 02/11/2019, 03/04/2019, 10/15/2019   PNEUMOCOCCAL CONJUGATE-20 12/18/2020   Pfizer Covid-19 Vaccine Bivalent Booster 12yrs & up 11/18/2020   Tdap 04/14/2011   Zoster Recombinat (Shingrix) 02/04/2021   Zoster, Live 04/14/2011    TDAP status: Due, Education has been provided regarding the importance of this vaccine. Advised may receive this vaccine at local pharmacy or Health Dept. Aware to provide a copy of the vaccination record if obtained from local pharmacy or Health Dept. Verbalized acceptance and understanding.  Flu Vaccine status: Completed at today's visit  Pneumococcal vaccine status: Up to date  Covid-19 vaccine status: Completed vaccines  Qualifies for Shingles Vaccine? Yes   Zostavax completed Yes   Shingrix Completed?: Yes  Screening Tests Health Maintenance  Topic Date Due   COVID-19 Vaccine (5 - Pfizer series) 03/18/2021   Zoster Vaccines- Shingrix (2 of 2) 04/01/2021   TETANUS/TDAP  09/16/2022 (Originally 04/13/2021)   Diabetic kidney evaluation - GFR measurement  12/18/2021   Diabetic kidney evaluation - Urine ACR  12/18/2021   MAMMOGRAM  02/24/2022   OPHTHALMOLOGY EXAM  03/03/2022   HEMOGLOBIN A1C  03/16/2022   FOOT EXAM  09/16/2022   DEXA SCAN  09/19/2022   Medicare Annual Wellness (AWV)  10/31/2022   COLONOSCOPY (Pts 45-49yrs Insurance coverage will need to be confirmed)  02/22/2027   Pneumonia Vaccine 65+ Years old  Completed   INFLUENZA VACCINE  Completed   HPV VACCINES  Aged Out   Hepatitis C Screening   Discontinued    Health Maintenance  Health Maintenance Due  Topic Date Due   COVID-19 Vaccine (5 - Pfizer series) 03/18/2021   Zoster Vaccines- Shingrix (2 of 2) 04/01/2021    Colorectal cancer screening: Type of screening: Colonoscopy. Completed 02/21/17. Repeat every 10 years  Mammogram status: Completed 02/24/21. Repeat every year  Bone Density status: Completed 09/18/20. Results reflect: Bone density results: OSTEOPOROSIS. Repeat every 2 years.   Additional Screening:  Hepatitis C Screening: does not qualify;  Vision Screening: Recommended annual ophthalmology exams for early detection of glaucoma and other disorders of the eye. Is the patient up to date with their annual eye exam?  Yes  Who is the provider or what is the name of the office in which the patient attends annual eye exams? Dr Groat  If pt is not established with a provider, would they like to be referred to a provider to establish care? No .   Dental Screening: Recommended annual dental exams for proper oral hygiene  Community Resource Referral / Chronic Care Management: CRR required this visit?  No   CCM required this visit?  No        Plan:     I have personally reviewed and noted the following in the patient's chart:   Medical and social history Use of alcohol, tobacco or illicit drugs  Current medications and supplements including opioid prescriptions. Patient is not currently taking opioid prescriptions. Functional ability and status Nutritional status Physical activity Advanced directives List of other physicians Hospitalizations, surgeries, and ER visits in previous 12 months Vitals Screenings to include cognitive, depression, and falls Referrals and appointments  In addition, I have reviewed and discussed with patient certain preventive protocols, quality metrics, and best practice recommendations. A written personalized care plan for preventive services as well as general preventive health  recommendations were provided to patient.     Tina H Betterson, LPN   10/30/2021   Nurse Notes: none       

## 2021-11-10 DIAGNOSIS — M722 Plantar fascial fibromatosis: Secondary | ICD-10-CM | POA: Diagnosis not present

## 2021-11-10 DIAGNOSIS — R29898 Other symptoms and signs involving the musculoskeletal system: Secondary | ICD-10-CM | POA: Diagnosis not present

## 2021-11-10 DIAGNOSIS — M2022 Hallux rigidus, left foot: Secondary | ICD-10-CM | POA: Diagnosis not present

## 2021-11-18 ENCOUNTER — Ambulatory Visit (HOSPITAL_BASED_OUTPATIENT_CLINIC_OR_DEPARTMENT_OTHER): Payer: Medicare PPO | Admitting: Physical Therapy

## 2021-11-19 ENCOUNTER — Encounter (HOSPITAL_BASED_OUTPATIENT_CLINIC_OR_DEPARTMENT_OTHER): Payer: Self-pay | Admitting: Physical Therapy

## 2021-11-19 ENCOUNTER — Other Ambulatory Visit: Payer: Self-pay

## 2021-11-19 ENCOUNTER — Ambulatory Visit (HOSPITAL_BASED_OUTPATIENT_CLINIC_OR_DEPARTMENT_OTHER): Payer: Medicare PPO | Attending: Anesthesiology | Admitting: Physical Therapy

## 2021-11-19 DIAGNOSIS — R293 Abnormal posture: Secondary | ICD-10-CM | POA: Insufficient documentation

## 2021-11-19 DIAGNOSIS — M5459 Other low back pain: Secondary | ICD-10-CM | POA: Diagnosis not present

## 2021-11-19 DIAGNOSIS — R262 Difficulty in walking, not elsewhere classified: Secondary | ICD-10-CM | POA: Insufficient documentation

## 2021-11-19 NOTE — Therapy (Signed)
OUTPATIENT PHYSICAL THERAPY THORACOLUMBAR EVALUATION   Patient Name: Amy Bartlett MRN: [id number] DOB:01-24-1950, 71 y.o., female Today's Date: 11/19/2021  END OF SESSION:  PT End of Session - 11/19/21 1100     Visit Number 1    Number of Visits 20    Date for PT Re-Evaluation 02/18/22    Authorization Type [employer]    PT Start Time 1056    PT Stop Time 1145    PT Time Calculation (min) 49 min    Activity Tolerance Patient tolerated treatment well    Behavior During Therapy WFL for tasks assessed/performed             Past Medical History:  Diagnosis Date   Arthritis    Chicken pox    Diabetes mellitus (HCC)    Hyperlipidemia    Hypertension    Hyponatremia - HCTZ 07/15/2020   Osteopenia    Past Surgical History:  Procedure Laterality Date   CARDIAC PACEMAKER PLACEMENT     CESAREAN SECTION  1974, 1977, 1979   REDUCTION MAMMAPLASTY Bilateral 2009   TUBAL LIGATION     Patient Active Problem List   Diagnosis Date Noted   Class 2 severe obesity due to excess calories with serious comorbidity and body mass index (BMI) of 35.0 to 35.9 in adult [facility]) 03/10/2021   Diabetic peripheral neuropathy associated with type 2 diabetes mellitus (HCC) 12/18/2020   Hyponatremia - HCTZ 07/15/2020   Spondylosis of lumbar region without myelopathy or radiculopathy 07/14/2020   Cardiac pacemaker in situ 03/19/2019   CKD (chronic kidney disease) stage 3, GFR 30-59 ml/min (HCC) 10/04/2016   Obesity (BMI 30-39.9) 01/12/2016   NSVT (nonsustained ventricular tachycardia) 12/05/2015   Primary localized osteoarthrosis of left lower leg 07/16/2014   History of complete heart block 10/04/2012   Combined hyperlipidemia associated with type 2 diabetes mellitus (HCC) 11/04/2011   Type 2 diabetes mellitus with stage 3b chronic kidney disease, without long-term current use of insulin (HCC) 11/04/2011   Age-related osteoporosis without fracture 06/01/2011   Vitamin D deficiency  04/16/2011   Hypertension associated with diabetes (HCC) 01/11/2011    PCP: Asencion Partridge, MD  REFERRING PROVIDER:  Windle Guard, MD    REFERRING DIAG: M54.50 (ICD-10-CM) - Low back pain, unspecified   Rationale for Evaluation and Treatment: Rehabilitation  THERAPY DIAG:  Other low back pain  Abnormal posture  Difficulty in walking, not elsewhere classified  ONSET DATE: Started in the 90s on and off and began again about 5 years ago  SUBJECTIVE:  SUBJECTIVE STATEMENT: Back pain started a long time ago. Back in 1994-5 I had an MRI done and they told me I had DDD- they wanted to do surgery and I said no, I did aquatic rehab and did ok. About 5 years ago I was walking one day and something popped in my right hip and then I could hardly walk. To walk comfortably I have to lean forward to manage the pain. I can walk better in a store when I can lean on a cart.  N/T bil feet due to DM.  Right lower back really bothers me when I try to lift heavy objects or reach overhead into cabinets.  Denies continuation of HEP from last episode.   PERTINENT HISTORY:  T2DM, h/o c-section, OP, *pacemaker present*  PAIN:  Are you having pain? Yes: NPRS scale: ok right now because I am sitting/10 Pain location: low back, noted worse in right Pain description: gradually grows Aggravating factors: standing, walking, morning stiffness Relieving factors: sit down  PRECAUTIONS: ICD/Pacemaker  WEIGHT BEARING RESTRICTIONS: No  FALLS:  Has patient fallen in last 6 months? No  OCCUPATION: not working  PLOF: Independent  PATIENT GOALS: Decrease back pain, improve walking tolerance  OBJECTIVE:   DIAGNOSTIC FINDINGS:  MRI 10/10: T12-L1: No significant disc bulge. No spinal canal stenosis or neural foraminal  narrowing.   L1-L2: No significant disc bulge. Mild facet arthropathy. No spinal canal stenosis or neural foraminal narrowing.   L2-L3: No significant disc bulge. Mild facet arthropathy. No spinal canal stenosis or neural foraminal narrowing.   L3-L4: Minimal disc bulge. Mild facet arthropathy. No spinal canal stenosis or neural foraminal narrowing.   L4-L5: Grade 1 anterolisthesis with disc unroofing and moderate disc bulge. Moderate to severe facet arthropathy. Ligamentum flavum hypertrophy. Moderate to severe spinal canal stenosis. Effacement of the lateral recesses. Mild left neural foraminal narrowing.   L5-S1: Disc height loss and moderate disc bulge with superimposed right subarticular protrusion, which effaces the right lateral recess and contacts the descending right S1 nerve roots. Mild facet arthropathy. Narrowing of the left lateral recess. No spinal canal stenosis or neural foraminal narrowing.   IMPRESSION: 1. L4-L5 moderate to severe spinal canal stenosis and mild left neural foraminal narrowing. Effacement of the lateral recesses at this level likely compresses the descending L5 nerve roots. 2. L5-S1 right subarticular disc protrusion effaces the right lateral recess and contacts the descending right S1 nerve roots. Narrowing of the left lateral recess at this level likely affects the descending left S1 nerve roots.    PATIENT SURVEYS:  FOTO 56  SENSATION: N/T bil feet due to DM.   MUSCLE LENGTH: Bilateral hamstring tightness limiting passive straight leg raise to approximately 50 degrees bilaterally  POSTURE: flexed at hips with incr lumbar lordosis  PALPATION: Denied tenderness to palpation in bilateral SIJ  LUMBAR ROM:   AROM eval  Flexion Proximal 1/3 shin  Extension   Right lateral flexion   Left lateral flexion   Right rotation   Left rotation    (Blank rows = not tested)  LOWER EXTREMITY MMT:    MMT Right eval Left eval  Hip flexion     Hip extension    Hip abduction 4+/5 4+/5  Hip adduction    Hip internal rotation    Hip external rotation    Knee flexion    Knee extension    Ankle dorsiflexion    Ankle plantarflexion    Ankle inversion    Ankle eversion     (  Blank rows = not tested)  GAIT: Distance walked: walking to clinic Level of assistance: Complete Independence Comments: flexed posture, short step length with flat foot gait and slow cadence.   TODAY'S TREATMENT:                                                                                                                               Treatment                            EVAL  11/19/21:  Supine pelvic tilt, +march- both with ball bw knees Seated HS & piriformis stretch Gastroc & hip flexor stretch   PATIENT EDUCATION:  Education details: Anatomy of condition, POC, HEP, exercise form/rationale Person educated: Patient Education method: Explanation, Demonstration, Tactile cues, Verbal cues, and Handouts Education comprehension: verbalized understanding, returned demonstration, verbal cues required, tactile cues required, and needs further education  HOME EXERCISE PROGRAM: [id number]   ASSESSMENT:  CLINICAL IMPRESSION: Patient is a 71 y.o. F who was seen today for physical therapy evaluation and treatment for chronic low back pain and bilateral hip pain.  Hip abduction strength is not concerning at this time but will benefit from strengthening and glutes to improve upright posture without increasing lumbar extension.  Anterior listhesis noted in MRI report and will continue to remain aware of any neural symptoms running distally.  OBJECTIVE IMPAIRMENTS: Abnormal gait, decreased activity tolerance, decreased balance, difficulty walking, decreased ROM, decreased strength, increased muscle spasms, impaired flexibility, improper body mechanics, postural dysfunction, and pain.   ACTIVITY LIMITATIONS: carrying, lifting, bending, standing, squatting,  stairs, transfers, bed mobility, reach over head, and locomotion level  PARTICIPATION LIMITATIONS: meal prep, cleaning, laundry, and community activity  PERSONAL FACTORS: Fitness and Time since onset of injury/illness/exacerbation are also affecting patient's functional outcome.   REHAB POTENTIAL: Good  CLINICAL DECISION MAKING: Stable/uncomplicated  EVALUATION COMPLEXITY: Low   GOALS: Goals reviewed with patient? Yes  SHORT TERM GOALS: Target date: 12/24/2021   Patient will verbalize performance of daily stretches upon return from traveling around the holiday Baseline: Not currently performing any daily exercise routine Goal status: INITIAL   LONG TERM GOALS: Target date: POC date  Patient will be able to reach overhead without increased back pain Baseline:  Goal status: INITIAL  2.  Patient will wake in the morning with minimal discomfort from back Baseline:  Goal status: INITIAL  3.  Patient will be able to walk through grocery store without limitation by back pain Baseline:  Goal status: INITIAL  4.  Patient will meet stated Foto goal Baseline:  Goal status: INITIAL  5.  Patient will demonstrate stacked posture in an upright stance Baseline:  Goal status: INITIAL   PLAN:  PT FREQUENCY: 1-2x/week  PT DURATION: other: 13 weeks, extended due to travel around the holidays  PLANNED INTERVENTIONS: Therapeutic exercises, Therapeutic activity, Neuromuscular re-education, Balance training, Gait training, Patient/Family education, Self Care, Joint  mobilization, Stair training, Aquatic Therapy, Dry Needling, Spinal mobilization, Cryotherapy, Moist heat, Taping, Traction, Manual therapy, and Re-evaluation.  PLAN FOR NEXT SESSION: Review daily stretches and begin aquatic therapy for core strengthening hip flexor lengthening glutes engagement  Referring diagnosis? M54.50 Treatment diagnosis? (if different than referring diagnosis) M54.59 R29.3 R26.2 What was this  (referring dx) caused by? []  Surgery []  Fall [x]  Ongoing issue []  Arthritis []  Other: ____________  Laterality: []  Rt []  Lt [x]  Both  Check all possible CPT codes:  *CHOOSE 10 OR LESS*    []  97110 (Therapeutic Exercise)  []  92507 (SLP Treatment)  []  97112 (Neuro Re-ed)   []  92526 (Swallowing Treatment)   []  97116 (Gait Training)   []  [id number] (Cognitive Training, 1st 15 minutes) []  [id number] (Manual Therapy)   []  97130 (Cognitive Training, each add'l 15 minutes)  []  97164 (Re-evaluation)                              []  Other, List CPT Code ____________  []  97530 (Therapeutic Activities)     []  97535 (Self Care)   [x]  All codes above (97110 - 97535)  [x]  97012 (Mechanical Traction)  []  97014 (E-stim Unattended)  []  97032 (E-stim manual)  []  97033 (Ionto)  []  97035 (Ultrasound) []  97750 (Physical Performance Training) [x]  [id number] (Aquatic Therapy) []  97016 (Vasopneumatic Device) []  [id number] (Paraffin) []  97034 (Contrast Bath) []  97597 (Wound Care 1st 20 sq cm) []  97598 (Wound Care each add'l 20 sq cm) []  97760 (Orthotic Fabrication, Fitting, Training Initial) []  [id number] (Prosthetic Management and Training Initial) []  [id number] (Orthotic or Prosthetic Training/ Modification Subsequent)   Ajai Harville C. Jacquita Mulhearn PT, DPT 11/19/21 1:31 PM

## 2021-11-23 ENCOUNTER — Telehealth: Payer: Self-pay | Admitting: *Deleted

## 2021-11-23 ENCOUNTER — Encounter: Payer: Self-pay | Admitting: *Deleted

## 2021-11-23 NOTE — Patient Outreach (Signed)
  Care Coordination   Initial Visit Note   11/23/2021 Name: Addilyne Backs MRN: [id number] DOB: 1950-08-18  Heywood Bene Vandekamp is a 71 y.o. year old female who sees Willow Ora, MD for primary care. I spoke with  Hessie Diener by phone today.  What matters to the patients health and wellness today?  Nothing needed today    Goals Addressed               This Visit's Progress     COMPLETED: Nothing today (pt-stated)        Care Coordination Interventions: Reviewed medications with patient and discussed adherence with all medications with no needed refills Reviewed scheduled/upcoming provider appointments including sufficient transportation Assessed social determinant of health barriers          SDOH assessments and interventions completed:  Yes  SDOH Interventions Today    Flowsheet Row Most Recent Value  SDOH Interventions   Food Insecurity Interventions Intervention Not Indicated  Housing Interventions Intervention Not Indicated  Transportation Interventions Intervention Not Indicated  Utilities Interventions Intervention Not Indicated        Care Coordination Interventions Activated:  Yes  Care Coordination Interventions:  Yes, provided   Follow up plan: No further intervention required.   Encounter Outcome:  Pt. Visit Completed   Elliot Cousin, RN Care Management Coordinator [facility] Main Office [phone]

## 2021-11-23 NOTE — Patient Instructions (Signed)
Visit Information  Thank you for taking time to visit with me today. Please don't hesitate to contact me if I can be of assistance to you.   Following are the goals we discussed today:   Goals Addressed               This Visit's Progress     COMPLETED: Nothing today (pt-stated)        Care Coordination Interventions: Reviewed medications with patient and discussed adherence with all medications with no needed refills Reviewed scheduled/upcoming provider appointments including sufficient transportation Assessed social determinant of health barriers         Please call the care guide team at [phone] if you need to cancel or reschedule your appointment.   If you are experiencing a Mental Health or Behavioral Health Crisis or need someone to talk to, please call the Suicide and Crisis Lifeline: 988  Patient verbalizes understanding of instructions and care plan provided today and agrees to view in MyChart. Active MyChart status and patient understanding of how to access instructions and care plan via MyChart confirmed with patient.     No further follow up required: No needs presented today  Elliot Cousin, RN Care Management Coordinator [facility] Main Office [phone]

## 2021-12-10 ENCOUNTER — Other Ambulatory Visit: Payer: Self-pay | Admitting: Family Medicine

## 2021-12-16 ENCOUNTER — Ambulatory Visit (HOSPITAL_BASED_OUTPATIENT_CLINIC_OR_DEPARTMENT_OTHER): Payer: Medicare PPO | Admitting: Physical Therapy

## 2021-12-17 ENCOUNTER — Other Ambulatory Visit: Payer: Self-pay | Admitting: Family Medicine

## 2021-12-17 ENCOUNTER — Encounter: Payer: Medicare PPO | Admitting: Family Medicine

## 2021-12-18 ENCOUNTER — Ambulatory Visit (HOSPITAL_BASED_OUTPATIENT_CLINIC_OR_DEPARTMENT_OTHER): Payer: Medicare PPO | Admitting: Physical Therapy

## 2021-12-22 ENCOUNTER — Ambulatory Visit (HOSPITAL_BASED_OUTPATIENT_CLINIC_OR_DEPARTMENT_OTHER): Payer: Medicare PPO | Admitting: Physical Therapy

## 2021-12-22 ENCOUNTER — Encounter (HOSPITAL_BASED_OUTPATIENT_CLINIC_OR_DEPARTMENT_OTHER): Payer: Self-pay

## 2021-12-22 ENCOUNTER — Encounter (HOSPITAL_BASED_OUTPATIENT_CLINIC_OR_DEPARTMENT_OTHER): Payer: Self-pay | Admitting: Physical Therapy

## 2021-12-22 DIAGNOSIS — R262 Difficulty in walking, not elsewhere classified: Secondary | ICD-10-CM

## 2021-12-22 DIAGNOSIS — M5459 Other low back pain: Secondary | ICD-10-CM | POA: Insufficient documentation

## 2021-12-22 DIAGNOSIS — R293 Abnormal posture: Secondary | ICD-10-CM

## 2021-12-22 DIAGNOSIS — M25552 Pain in left hip: Secondary | ICD-10-CM | POA: Insufficient documentation

## 2021-12-22 DIAGNOSIS — M25551 Pain in right hip: Secondary | ICD-10-CM | POA: Insufficient documentation

## 2021-12-22 NOTE — Therapy (Signed)
[facility] [address] [place], [place], [address] Phone: [phone]   Fax:  [phone]  Patient Details  Name: Amy Bartlett MRN: [id number] Date of Birth: 03-04-1950 Referring Provider:  Willow Ora, MD  Encounter Date: 12/22/2021  Patient arrived to the pool, but stated she thought today was just about getting tour of facility; she did not bring her swim suit with her. "I was not prepared for today. "  Patient stated she has been out of town caring for her sister (last visit in Nov).  She stated she has been doing her HEP even though it has not provided LBP relief.    Walked patient around pool area, including changing room and women's dressing room, informing her of where to place her belongings and what to expect for the first visit.    Patient verbalized understanding and confirmed next upcoming appt on Thursday.    No charge as patient was not seen for services.   Mayer Camel, PTA 12/22/21 12:26 PM [facility] [address] [place], [place], [address] Phone: [phone]   Fax:  [phone]

## 2021-12-23 ENCOUNTER — Ambulatory Visit (INDEPENDENT_AMBULATORY_CARE_PROVIDER_SITE_OTHER): Payer: Medicare PPO | Admitting: Podiatry

## 2021-12-23 DIAGNOSIS — M2042 Other hammer toe(s) (acquired), left foot: Secondary | ICD-10-CM | POA: Diagnosis not present

## 2021-12-23 DIAGNOSIS — B351 Tinea unguium: Secondary | ICD-10-CM | POA: Diagnosis not present

## 2021-12-23 DIAGNOSIS — M79675 Pain in left toe(s): Secondary | ICD-10-CM

## 2021-12-23 DIAGNOSIS — E1142 Type 2 diabetes mellitus with diabetic polyneuropathy: Secondary | ICD-10-CM

## 2021-12-23 DIAGNOSIS — I739 Peripheral vascular disease, unspecified: Secondary | ICD-10-CM

## 2021-12-23 DIAGNOSIS — M79674 Pain in right toe(s): Secondary | ICD-10-CM

## 2021-12-23 DIAGNOSIS — M2041 Other hammer toe(s) (acquired), right foot: Secondary | ICD-10-CM | POA: Diagnosis not present

## 2021-12-24 ENCOUNTER — Encounter: Payer: Self-pay | Admitting: Podiatry

## 2021-12-24 ENCOUNTER — Ambulatory Visit (HOSPITAL_BASED_OUTPATIENT_CLINIC_OR_DEPARTMENT_OTHER): Payer: Medicare PPO | Attending: Anesthesiology | Admitting: Physical Therapy

## 2021-12-24 ENCOUNTER — Encounter (HOSPITAL_BASED_OUTPATIENT_CLINIC_OR_DEPARTMENT_OTHER): Payer: Self-pay | Admitting: Physical Therapy

## 2021-12-24 DIAGNOSIS — R262 Difficulty in walking, not elsewhere classified: Secondary | ICD-10-CM | POA: Diagnosis not present

## 2021-12-24 DIAGNOSIS — M25551 Pain in right hip: Secondary | ICD-10-CM | POA: Diagnosis not present

## 2021-12-24 DIAGNOSIS — R293 Abnormal posture: Secondary | ICD-10-CM | POA: Diagnosis not present

## 2021-12-24 DIAGNOSIS — M5459 Other low back pain: Secondary | ICD-10-CM | POA: Diagnosis not present

## 2021-12-24 DIAGNOSIS — M25552 Pain in left hip: Secondary | ICD-10-CM | POA: Diagnosis not present

## 2021-12-24 NOTE — Progress Notes (Signed)
  Subjective:  Patient ID: Amy Bartlett, female    DOB: Jan 28, 1950,  MRN: [id number]  Amy Bartlett presents to clinic today for preventative diabetic foot care and painful elongated mycotic toenails 1-5 bilaterally which are tender when wearing enclosed shoe gear. Pain is relieved with periodic professional debridement.   She relates her toes are painful right foot>left.   Chief Complaint  Patient presents with   Nail Problem    DFC  BG - pt does not recall A1C - 6.7 PCP - Dr Mardelle Matte , last OV 09/2021   New problem(s): None.   PCP is Willow Ora, MD.  No Known Allergies  Review of Systems: Negative except as noted in the HPI.  Objective: No changes noted in today's physical examination. There were no vitals filed for this visit. Amy Bartlett is a pleasant 71 y.o. female WD, WN in NAD. AAO x 3.  Vascular Examination: CFT immediate b/l LE. Palpable DP/PT pulses b/l LE. Digital hair present b/l. Skin temperature gradient WNL b/l. No pain with calf compression b/l. No edema noted b/l. No cyanosis or clubbing noted b/l LE.  Dermatological Examination: Pedal skin with normal turgor, texture and tone b/l lower extremities. No open wounds b/l LE. No interdigital macerations noted b/l LE. Toenails 1-5 bilaterally elongated, discolored, dystrophic, thickened, and crumbly with subungual debris and tenderness to dorsal palpation.  Musculoskeletal Examination: Muscle strength 5/5 to all lower extremity muscle groups bilaterally. HAV with bunion deformity noted b/l LE. Hammertoes 2-5 b/l. Limited joint ROM to the bilateral ankles.  Neurological Examination: Protective sensation intact 5/5 intact bilaterally with 10g monofilament b/l. Vibratory sensation intact b/l. Proprioception intact bilaterally.  Assessment/Plan: 1. Pain due to onychomycosis of toenails of both feet   2. Peripheral vascular disease with claudication (HCC)   3. Diabetic peripheral  neuropathy associated with type 2 diabetes mellitus (HCC)     No orders of the defined types were placed in this encounter.   -Patient was evaluated and treated. All patient's and/or POA's questions/concerns answered on today's visit. -Patient to schedule appointment with Dr. Lilian Kapur for hammertoes b/l feet. -Continue foot and shoe inspections daily. Monitor blood glucose per PCP/Endocrinologist's recommendations. -Continue supportive shoe gear daily. -Toenails 1-5 b/l were debrided in length and girth with sterile nail nippers and dremel without iatrogenic bleeding.  -Patient/POA to call should there be question/concern in the interim.   Return in about 3 months (around 03/24/2022).  Freddie Breech, DPM

## 2021-12-24 NOTE — Therapy (Signed)
OUTPATIENT PHYSICAL THERAPY THORACOLUMBAR EVALUATION   Patient Name: Amy Bartlett MRN: [id number] DOB:01/01/1951, 71 y.o., female Today's Date: 12/24/2021  END OF SESSION:  PT End of Session - 12/24/21 1211     Visit Number 2    Number of Visits 20    Date for PT Re-Evaluation 02/18/22    Authorization Type [employer]    PT Start Time 1210    PT Stop Time 1253    PT Time Calculation (min) 43 min    Activity Tolerance Patient tolerated treatment well    Behavior During Therapy WFL for tasks assessed/performed             Past Medical History:  Diagnosis Date   Arthritis    Chicken pox    Diabetes mellitus ([place])    Hyperlipidemia    Hypertension    Hyponatremia - HCTZ 07/15/2020   Osteopenia    Past Surgical History:  Procedure Laterality Date   [place] Bilateral 2009   TUBAL LIGATION     Patient Active Problem List   Diagnosis Date Noted   Class 2 severe obesity due to excess calories with serious comorbidity and body mass index (BMI) of 35.0 to 35.9 in adult [facility]) 03/10/2021   Diabetic peripheral neuropathy associated with type 2 diabetes mellitus ([place]) 12/18/2020   Hyponatremia - HCTZ 07/15/2020   Spondylosis of lumbar region without myelopathy or radiculopathy 07/14/2020   Cardiac pacemaker in situ 03/19/2019   CKD (chronic kidney disease) stage 3, GFR 30-59 ml/min (HCC) 10/04/2016   Obesity (BMI 30-39.9) 01/12/2016   NSVT (nonsustained ventricular tachycardia) 12/05/2015   Primary localized osteoarthrosis of left lower leg 07/16/2014   History of complete heart block 10/04/2012   Combined hyperlipidemia associated with type 2 diabetes mellitus ([place]) 11/04/2011   Type 2 diabetes mellitus with stage 3b chronic kidney disease, without long-term current use of insulin ([place]) 11/04/2011   Age-related osteoporosis without fracture 06/01/2011   Vitamin D deficiency  04/16/2011   Hypertension associated with diabetes ([place]) 01/11/2011    PCP: Billey Chang, MD  REFERRING PROVIDER:  Bebe Shaggy, MD    REFERRING DIAG: M54.50 (ICD-10-CM) - Low back pain, unspecified   Rationale for Evaluation and Treatment: Rehabilitation  THERAPY DIAG:  Other low back pain  Abnormal posture  Difficulty in walking, not elsewhere classified  Bilateral hip pain  ONSET DATE: Started in the 90s on and off and began again about 5 years ago  SUBJECTIVE:                                                                                                                                                                                          "  LBP high walking po pool 7/10.  I love the water not afraid" SUBJECTIVE STATEMENT: Back pain started a long time ago. Back in 1994-5 I had an MRI done and they told me I had DDD- they wanted to do surgery and I said no, I did aquatic rehab and did ok. About 5 years ago I was walking one day and something popped in my right hip and then I could hardly walk. To walk comfortably I have to lean forward to manage the pain. I can walk better in a store when I can lean on a cart.  N/T bil feet due to DM.  Right lower back really bothers me when I try to lift heavy objects or reach overhead into cabinets.  Denies continuation of HEP from last episode.   PERTINENT HISTORY:  T2DM, h/o c-section, OP, *pacemaker present*  PAIN:  Are you having pain? Yes: NPRS scale: ok right now because I am sitting/10 Pain location: low back, noted worse in right Pain description: gradually grows Aggravating factors: standing, walking, morning stiffness Relieving factors: sit down  PRECAUTIONS: ICD/Pacemaker  WEIGHT BEARING RESTRICTIONS: No  FALLS:  Has patient fallen in last 6 months? No  OCCUPATION: not working  PLOF: Independent  PATIENT GOALS: Decrease back pain, improve walking tolerance  OBJECTIVE:   DIAGNOSTIC FINDINGS:  MRI  10/10: T12-L1: No significant disc bulge. No spinal canal stenosis or neural foraminal narrowing.   L1-L2: No significant disc bulge. Mild facet arthropathy. No spinal canal stenosis or neural foraminal narrowing.   L2-L3: No significant disc bulge. Mild facet arthropathy. No spinal canal stenosis or neural foraminal narrowing.   L3-L4: Minimal disc bulge. Mild facet arthropathy. No spinal canal stenosis or neural foraminal narrowing.   L4-L5: Grade 1 anterolisthesis with disc unroofing and moderate disc bulge. Moderate to severe facet arthropathy. Ligamentum flavum hypertrophy. Moderate to severe spinal canal stenosis. Effacement of the lateral recesses. Mild left neural foraminal narrowing.   L5-S1: Disc height loss and moderate disc bulge with superimposed right subarticular protrusion, which effaces the right lateral recess and contacts the descending right S1 nerve roots. Mild facet arthropathy. Narrowing of the left lateral recess. No spinal canal stenosis or neural foraminal narrowing.   IMPRESSION: 1. L4-L5 moderate to severe spinal canal stenosis and mild left neural foraminal narrowing. Effacement of the lateral recesses at this level likely compresses the descending L5 nerve roots. 2. L5-S1 right subarticular disc protrusion effaces the right lateral recess and contacts the descending right S1 nerve roots. Narrowing of the left lateral recess at this level likely affects the descending left S1 nerve roots.    PATIENT SURVEYS:  FOTO 70  SENSATION: N/T bil feet due to DM.   MUSCLE LENGTH: Bilateral hamstring tightness limiting passive straight leg raise to approximately 50 degrees bilaterally  POSTURE: flexed at hips with incr lumbar lordosis  PALPATION: Denied tenderness to palpation in bilateral SIJ  LUMBAR ROM:   AROM eval  Flexion Proximal 1/3 shin  Extension   Right lateral flexion   Left lateral flexion   Right rotation   Left rotation    (Blank  rows = not tested)  LOWER EXTREMITY MMT:    MMT Right eval Left eval  Hip flexion    Hip extension    Hip abduction 4+/5 4+/5  Hip adduction    Hip internal rotation    Hip external rotation    Knee flexion    Knee extension    Ankle dorsiflexion    Ankle  plantarflexion    Ankle inversion    Ankle eversion     (Blank rows = not tested)  GAIT: Distance walked: walking to clinic Level of assistance: Complete Independence Comments: flexed posture, short step length with flat foot gait and slow cadence.   TODAY'S TREATMENT:                                                                                                                              Treatment                            EVAL  12/24/21 Pt seen for aquatic therapy today.  Treatment took place in water 3.25-4.5 ft in depth at the [facility]. Temp of water was 91.  Pt entered/exited the pool via stairs with hand rail.  Walking chest deep forward, back and side stepping Core engagement: 1 foam hand buoy submerged forward and  backward  amb x 2 widths  UE support on wall: 1/2 diamonds; reverse 1/2 diamonds 1/2 noodle pull downs le staggered the wide stance.  Cues for abd bracing and erect posture. Hip hinging against wall for proper execution. Straddling noodle: Cycling/add/abd    Pt requires the buoyancy and hydrostatic pressure of water for support, and to offload joints by unweighting joint load by at least 50 % in navel deep water and by at least 75-80% in chest to neck deep water.  Viscosity of the water is needed for resistance of strengthening. Water current perturbations provides challenge to standing balance requiring increased core activation.    Treatment                            EVAL  11/19/21:  Supine pelvic tilt, +march- both with ball bw knees Seated HS & piriformis stretch Gastroc & hip flexor stretch   PATIENT EDUCATION:  Education details: Anatomy of condition, POC, HEP,  exercise form/rationale Person educated: Patient Education method: Explanation, Demonstration, Tactile cues, Verbal cues, and Handouts Education comprehension: verbalized understanding, returned demonstration, verbal cues required, tactile cues required, and needs further education  HOME EXERCISE PROGRAM: [id number]   ASSESSMENT:  CLINICAL IMPRESSION: Pt is safe and indep in setting with therapist instructing from deck. Focused on acclimating pt to setting and testing toleration to exercises and stretching.Continual vc required for upright posture throughout session. Initiated core engagement with focus on posterior musculature. She tolerates well without complaints of pain but "can feel it in low back and lle". She does almost instantaneously   upon submerging state decreased LBP with pool. She is an excellent candidate for aquatic therapy and will benefit from the properties of water to improve muscle length and strength , decrease pain to improve functional mobility and safety   Initial Assessment: Patient is a 71 y.o. F who was seen today for physical therapy evaluation and treatment for chronic low  back pain and bilateral hip pain.  Hip abduction strength is not concerning at this time but will benefit from strengthening and glutes to improve upright posture without increasing lumbar extension.  Anterior listhesis noted in MRI report and will continue to remain aware of any neural symptoms running distally.  OBJECTIVE IMPAIRMENTS: Abnormal gait, decreased activity tolerance, decreased balance, difficulty walking, decreased ROM, decreased strength, increased muscle spasms, impaired flexibility, improper body mechanics, postural dysfunction, and pain.   ACTIVITY LIMITATIONS: carrying, lifting, bending, standing, squatting, stairs, transfers, bed mobility, reach over head, and locomotion level  PARTICIPATION LIMITATIONS: meal prep, cleaning, laundry, and community activity  PERSONAL FACTORS:  Fitness and Time since onset of injury/illness/exacerbation are also affecting patient's functional outcome.   REHAB POTENTIAL: Good  CLINICAL DECISION MAKING: Stable/uncomplicated  EVALUATION COMPLEXITY: Low   GOALS: Goals reviewed with patient? Yes  SHORT TERM GOALS: Target date: 12/24/2021   Patient will verbalize performance of daily stretches upon return from traveling around the holiday Baseline: Not currently performing any daily exercise routine Goal status: INITIAL   LONG TERM GOALS: Target date: POC date  Patient will be able to reach overhead without increased back pain Baseline:  Goal status: INITIAL  2.  Patient will wake in the morning with minimal discomfort from back Baseline:  Goal status: INITIAL  3.  Patient will be able to walk through grocery store without limitation by back pain Baseline:  Goal status: INITIAL  4.  Patient will meet stated Foto goal Baseline:  Goal status: INITIAL  5.  Patient will demonstrate stacked posture in an upright stance Baseline:  Goal status: INITIAL   PLAN:  PT FREQUENCY: 1-2x/week  PT DURATION: other: 13 weeks, extended due to travel around the holidays  PLANNED INTERVENTIONS: Therapeutic exercises, Therapeutic activity, Neuromuscular re-education, Balance training, Gait training, Patient/Family education, Self Care, Joint mobilization, Stair training, Aquatic Therapy, Dry Needling, Spinal mobilization, Cryotherapy, Moist heat, Taping, Traction, Manual therapy, and Re-evaluation.  PLAN FOR NEXT SESSION: Review daily stretches and begin aquatic therapy for core strengthening hip flexor lengthening glutes engagement  Referring diagnosis? M54.50 Treatment diagnosis? (if different than referring diagnosis) M54.59 R29.3 R26.2 What was this (referring dx) caused by? []  Surgery []  Fall [x]  Ongoing issue []  Arthritis []  Other: ____________  Laterality: []  Rt []  Lt [x]  Both  Check all possible CPT  codes:  *CHOOSE 10 OR LESS*    []  97110 (Therapeutic Exercise)  []  60454 (SLP Treatment)  []  97112 (Neuro Re-ed)   []  92526 (Swallowing Treatment)   []  97116 (Gait Training)   []  [id number] (Cognitive Training, 1st 15 minutes) []  97140 (Manual Therapy)   []  97130 (Cognitive Training, each add'l 15 minutes)  []  97164 (Re-evaluation)                              []  Other, List CPT Code ____________  []  97530 (Therapeutic Activities)     []  97535 (Self Care)   [x]  All codes above (97110 - 97535)  [x]  97012 (Mechanical Traction)  []  97014 (E-stim Unattended)  []  97032 (E-stim manual)  []  97033 (Ionto)  []  97035 (Ultrasound) []  97750 (Physical Performance Training) [x]  [id number] (Aquatic Therapy) []  97016 (Vasopneumatic Device) []  [id number] (Paraffin) []  97034 (Contrast Bath) []  97597 (Wound Care 1st 20 sq cm) []  97598 (Wound Care each add'l 20 sq cm) []  97760 (Orthotic Fabrication, Fitting, Training Initial) []  [id number] (Prosthetic Management and Training Initial) []  [id number] (Orthotic or Prosthetic Training/  Modification Subsequent)   Stanton Kidney Tharon Aquas) Tezra Mahr MPT 12/24/21 1:26 PM

## 2021-12-25 ENCOUNTER — Ambulatory Visit: Payer: Medicare PPO | Admitting: Podiatry

## 2021-12-29 DIAGNOSIS — I4729 Other ventricular tachycardia: Secondary | ICD-10-CM | POA: Diagnosis not present

## 2021-12-29 DIAGNOSIS — I442 Atrioventricular block, complete: Secondary | ICD-10-CM | POA: Diagnosis not present

## 2021-12-29 DIAGNOSIS — I10 Essential (primary) hypertension: Secondary | ICD-10-CM | POA: Diagnosis not present

## 2021-12-31 ENCOUNTER — Ambulatory Visit (HOSPITAL_BASED_OUTPATIENT_CLINIC_OR_DEPARTMENT_OTHER): Payer: Medicare PPO | Admitting: Physical Therapy

## 2021-12-31 DIAGNOSIS — R262 Difficulty in walking, not elsewhere classified: Secondary | ICD-10-CM | POA: Diagnosis not present

## 2021-12-31 DIAGNOSIS — M5459 Other low back pain: Secondary | ICD-10-CM | POA: Diagnosis not present

## 2021-12-31 DIAGNOSIS — R293 Abnormal posture: Secondary | ICD-10-CM

## 2021-12-31 DIAGNOSIS — M25551 Pain in right hip: Secondary | ICD-10-CM | POA: Diagnosis not present

## 2021-12-31 DIAGNOSIS — M25552 Pain in left hip: Secondary | ICD-10-CM | POA: Diagnosis not present

## 2021-12-31 NOTE — Therapy (Signed)
OUTPATIENT PHYSICAL THERAPY THORACOLUMBAR TREATMENT   Patient Name: Amy Bartlett MRN: [id number] DOB:Dec 18, 1950, 71 y.o., female Today's Date: 12/31/2021  END OF SESSION:  PT End of Session - 12/31/21 0823     Visit Number 3    Number of Visits 20    Date for PT Re-Evaluation 02/18/22    Authorization Type [employer] + [employer]    Progress Note Due on Visit 10    PT Start Time 0817    PT Stop Time 0858    PT Time Calculation (min) 41 min             Past Medical History:  Diagnosis Date   Arthritis    Chicken pox    Diabetes mellitus ([place])    Hyperlipidemia    Hypertension    Hyponatremia - HCTZ 07/15/2020   Osteopenia    Past Surgical History:  Procedure Laterality Date   CARDIAC PACEMAKER [place] Bilateral 2009   TUBAL LIGATION     Patient Active Problem List   Diagnosis Date Noted   Class 2 severe obesity due to excess calories with serious comorbidity and body mass index (BMI) of 35.0 to 35.9 in adult [facility]) 03/10/2021   Diabetic peripheral neuropathy associated with type 2 diabetes mellitus ([place]) 12/18/2020   Hyponatremia - HCTZ 07/15/2020   Spondylosis of lumbar region without myelopathy or radiculopathy 07/14/2020   Cardiac pacemaker in situ 03/19/2019   CKD (chronic kidney disease) stage 3, GFR 30-59 ml/min (HCC) 10/04/2016   Obesity (BMI 30-39.9) 01/12/2016   NSVT (nonsustained ventricular tachycardia) 12/05/2015   Primary localized osteoarthrosis of left lower leg 07/16/2014   History of complete heart block 10/04/2012   Combined hyperlipidemia associated with type 2 diabetes mellitus ([place]) 11/04/2011   Type 2 diabetes mellitus with stage 3b chronic kidney disease, without long-term current use of insulin ([place]) 11/04/2011   Age-related osteoporosis without fracture 06/01/2011   Vitamin D deficiency 04/16/2011   Hypertension associated with diabetes ([place]) 01/11/2011    PCP:  Billey Chang, MD  REFERRING PROVIDER:  Bebe Shaggy, MD    REFERRING DIAG: M54.50 (ICD-10-CM) - Low back pain, unspecified   Rationale for Evaluation and Treatment: Rehabilitation  THERAPY DIAG:  Other low back pain  Abnormal posture  Difficulty in walking, not elsewhere classified  Bilateral hip pain  ONSET DATE: Started in the 90s on and off and began again about 5 years ago  SUBJECTIVE:  Pt reports she did well after last session.  She reports she had relief afterwards, "I felt really good".    PERTINENT HISTORY:  T2DM, h/o c-section, OP, *pacemaker present*  PAIN:  Are you having pain? Yes:  NPRS scale: 0/10 at rest, 5/10 when walking  Pain location: lateral portion of thighs to knees Pain description: gradually grows Aggravating factors: standing, walking, morning stiffness Relieving factors: sit down  PRECAUTIONS: ICD/Pacemaker  WEIGHT BEARING RESTRICTIONS: No  FALLS:  Has patient fallen in last 6 months? No  OCCUPATION: not working  PLOF: Independent  PATIENT GOALS: Decrease back pain, improve walking tolerance  OBJECTIVE:   DIAGNOSTIC FINDINGS:  MRI 10/10: T12-L1: No significant disc bulge. No spinal canal stenosis or neural foraminal narrowing.   L1-L2: No significant disc bulge. Mild facet arthropathy. No spinal canal stenosis or neural foraminal narrowing.   L2-L3: No significant disc bulge. Mild facet arthropathy. No spinal canal stenosis or neural foraminal narrowing.   L3-L4: Minimal disc bulge. Mild facet arthropathy. No spinal canal stenosis or neural foraminal narrowing.   L4-L5: Grade 1 anterolisthesis with disc unroofing and moderate disc bulge. Moderate to severe facet arthropathy. Ligamentum flavum hypertrophy. Moderate to severe spinal canal  stenosis. Effacement of the lateral recesses. Mild left neural foraminal narrowing.   L5-S1: Disc height loss and moderate disc bulge with superimposed right subarticular protrusion, which effaces the right lateral recess and contacts the descending right S1 nerve roots. Mild facet arthropathy. Narrowing of the left lateral recess. No spinal canal stenosis or neural foraminal narrowing.   IMPRESSION: 1. L4-L5 moderate to severe spinal canal stenosis and mild left neural foraminal narrowing. Effacement of the lateral recesses at this level likely compresses the descending L5 nerve roots. 2. L5-S1 right subarticular disc protrusion effaces the right lateral recess and contacts the descending right S1 nerve roots. Narrowing of the left lateral recess at this level likely affects the descending left S1 nerve roots.    PATIENT SURVEYS:  FOTO 50  SENSATION: N/T bil feet due to DM.   MUSCLE LENGTH: Bilateral hamstring tightness limiting passive straight leg raise to approximately 50 degrees bilaterally  POSTURE: flexed at hips with incr lumbar lordosis  PALPATION: Denied tenderness to palpation in bilateral SIJ  LUMBAR ROM:   AROM eval  Flexion Proximal 1/3 shin  Extension   Right lateral flexion   Left lateral flexion   Right rotation   Left rotation    (Blank rows = not tested)  LOWER EXTREMITY MMT:    MMT Right eval Left eval  Hip flexion    Hip extension    Hip abduction 4+/5 4+/5  Hip adduction    Hip internal rotation    Hip external rotation    Knee flexion    Knee extension    Ankle dorsiflexion    Ankle plantarflexion    Ankle inversion    Ankle eversion     (Blank rows = not tested)  GAIT: Distance walked: walking to clinic Level of assistance: Complete Independence Comments: flexed posture, short step length with flat foot gait and slow cadence.   TODAY'S TREATMENT:  Treatment                           12/31/21 Pt seen for aquatic therapy today.  Treatment took place in water 3.25-4.5 ft in depth at the [facility]. Temp of water was 91.  Pt entered/exited the pool via stairs with hand rail.  * holding white barbell -> no UE support:  walking forward/ backward and side stepping * side stepping with increased step height and shoulder abdct/ add * high knee marching forward/ backward with reciprocal arm swing * holding white barbell: LE circumduction backwards 1 lap, forwards 1 lap (challenge);  heel raises x 15; 1/2 diamonds x 5 each * holding wall:  1/2 diamonds x 5 each (improved tolerance); wall push up/offs x 12 with cues for neutral spine (and head) * return to walking - backwards/ forwards * Core engagement: 1 rainbow hand buoy submerged forward and  backward  amb x 2 widths  * STS on bench with feet on blue step with forward arm reach and cues for hip hinge, core engaged  Pt requires the buoyancy and hydrostatic pressure of water for support, and to offload joints by unweighting joint load by at least 50 % in navel deep water and by at least 75-80% in chest to neck deep water.  Viscosity of the water is needed for resistance of strengthening. Water current perturbations provides challenge to standing balance requiring increased core activation.  PATIENT EDUCATION:  Education details: Geophysicist/field seismologist of condition, POC, HEP, exercise form/rationale Person educated: Patient Education method: Explanation, Demonstration, Tactile cues, Verbal cues, and Handouts Education comprehension: verbalized understanding, returned demonstration, verbal cues required, tactile cues required, and needs further education  HOME EXERCISE PROGRAM: [id number]   ASSESSMENT:  CLINICAL IMPRESSION: Positive response to initial aquatic session.  Pt reports she ambulates pain-free while in the water. She reported some discomfort  in Rt hip with circumducting RLE and with STS, but resolves with change in exercise.  She remains an excellent candidate for aquatic therapy and will benefit from the properties of water to improve muscle length and strength , decrease pain to improve functional mobility and safety. Goals are ongoing.    Initial Assessment: Patient is a 71 y.o. F who was seen today for physical therapy evaluation and treatment for chronic low back pain and bilateral hip pain.  Hip abduction strength is not concerning at this time but will benefit from strengthening and glutes to improve upright posture without increasing lumbar extension.  Anterior listhesis noted in MRI report and will continue to remain aware of any neural symptoms running distally.  OBJECTIVE IMPAIRMENTS: Abnormal gait, decreased activity tolerance, decreased balance, difficulty walking, decreased ROM, decreased strength, increased muscle spasms, impaired flexibility, improper body mechanics, postural dysfunction, and pain.   ACTIVITY LIMITATIONS: carrying, lifting, bending, standing, squatting, stairs, transfers, bed mobility, reach over head, and locomotion level  PARTICIPATION LIMITATIONS: meal prep, cleaning, laundry, and community activity  PERSONAL FACTORS: Fitness and Time since onset of injury/illness/exacerbation are also affecting patient's functional outcome.   REHAB POTENTIAL: Good  CLINICAL DECISION MAKING: Stable/uncomplicated  EVALUATION COMPLEXITY: Low   GOALS: Goals reviewed with patient? Yes  SHORT TERM GOALS: Target date: 12/24/2021   Patient will verbalize performance of daily stretches upon return from traveling around the holiday Baseline: Not currently performing any daily exercise routine Goal status: INITIAL   LONG TERM GOALS: Target date: POC date  Patient will be able to reach overhead without  increased back pain Baseline:  Goal status: INITIAL  2.  Patient will wake in the morning with minimal  discomfort from back Baseline:  Goal status: INITIAL  3.  Patient will be able to walk through grocery store without limitation by back pain Baseline:  Goal status: INITIAL  4.  Patient will meet stated Foto goal Baseline:  Goal status: INITIAL  5.  Patient will demonstrate stacked posture in an upright stance Baseline:  Goal status: INITIAL   PLAN:  PT FREQUENCY: 1-2x/week  PT DURATION: other: 13 weeks, extended due to travel around the holidays  PLANNED INTERVENTIONS: Therapeutic exercises, Therapeutic activity, Neuromuscular re-education, Balance training, Gait training, Patient/Family education, Self Care, Joint mobilization, Stair training, Aquatic Therapy, Dry Needling, Spinal mobilization, Cryotherapy, Moist heat, Taping, Traction, Manual therapy, and Re-evaluation.  PLAN FOR NEXT SESSION: Review daily stretches and continue aquatic therapy for core strengthening hip flexor lengthening glutes engagement  Mayer Camel, PTA 12/31/21 8:59 AM [facility] [address] [place], [place], [address] Phone: [phone]   Fax:  [phone]    Referring diagnosis? M54.50 Treatment diagnosis? (if different than referring diagnosis) M54.59 R29.3 R26.2 What was this (referring dx) caused by? []  Surgery []  Fall [x]  Ongoing issue []  Arthritis []  Other: ____________  Laterality: []  Rt []  Lt [x]  Both  Check all possible CPT codes:  *CHOOSE 10 OR LESS*    []  97110 (Therapeutic Exercise)  []  92507 (SLP Treatment)  []  97112 (Neuro Re-ed)   []  (Swallowing Treatment)   []  97116 (Gait Training)   []  (Cognitive Training, 1st 15 minutes) []  97140 (Manual Therapy)   []  97130 (Cognitive Training, each add'l 15 minutes)  []  97164 (Re-evaluation)                              []  Other, List CPT Code ____________  []  97530 (Therapeutic Activities)     []  97535 (Self Care)   [x]  All codes above (97110 -  97535)  [x]  97012 (Mechanical Traction)  []  97014 (E-stim Unattended)  []  97032 (E-stim manual)  []  97033 (Ionto)  []  97035 (Ultrasound) []  97750 (Physical Performance Training) [x]  (Aquatic Therapy) []  97016 (Vasopneumatic Device) []  (Paraffin) []  97034 (Contrast Bath) []  97597 (Wound Care 1st 20 sq cm) []  97598 (Wound Care each add'l 20 sq cm) []  97760 (Orthotic Fabrication, Fitting, Training Initial) []  (Prosthetic Management and Training Initial) []  (Orthotic or Prosthetic Training/ Modification Subsequent)

## 2022-01-05 ENCOUNTER — Ambulatory Visit (HOSPITAL_BASED_OUTPATIENT_CLINIC_OR_DEPARTMENT_OTHER): Payer: Medicare PPO | Attending: Anesthesiology | Admitting: Physical Therapy

## 2022-01-05 ENCOUNTER — Encounter (HOSPITAL_BASED_OUTPATIENT_CLINIC_OR_DEPARTMENT_OTHER): Payer: Self-pay | Admitting: Physical Therapy

## 2022-01-05 DIAGNOSIS — M25552 Pain in left hip: Secondary | ICD-10-CM | POA: Insufficient documentation

## 2022-01-05 DIAGNOSIS — M5451 Vertebrogenic low back pain: Secondary | ICD-10-CM | POA: Diagnosis not present

## 2022-01-05 DIAGNOSIS — M25551 Pain in right hip: Secondary | ICD-10-CM | POA: Diagnosis not present

## 2022-01-05 DIAGNOSIS — M5459 Other low back pain: Secondary | ICD-10-CM | POA: Insufficient documentation

## 2022-01-05 DIAGNOSIS — R293 Abnormal posture: Secondary | ICD-10-CM | POA: Diagnosis not present

## 2022-01-05 DIAGNOSIS — R262 Difficulty in walking, not elsewhere classified: Secondary | ICD-10-CM | POA: Diagnosis not present

## 2022-01-05 NOTE — Therapy (Signed)
OUTPATIENT PHYSICAL THERAPY THORACOLUMBAR TREATMENT   Patient Name: Amy Bartlett MRN: [id number] DOB:Feb 10, 1950, 72 y.o., female Today's Date: 01/05/2022  END OF SESSION:  PT End of Session - 01/05/22 1203     Visit Number 4    Number of Visits 20    Date for PT Re-Evaluation 02/18/22    Authorization Type [employer] + [employer]    Progress Note Due on Visit 10    PT Start Time 1200    PT Stop Time 1244    PT Time Calculation (min) 44 min    Activity Tolerance Patient tolerated treatment well             Past Medical History:  Diagnosis Date   Arthritis    Chicken pox    Diabetes mellitus (HCC)    Hyperlipidemia    Hypertension    Hyponatremia - HCTZ 07/15/2020   Osteopenia    Past Surgical History:  Procedure Laterality Date   CARDIAC PACEMAKER [place]     CESAREAN SECTION  1974, 1977, 1979   REDUCTION MAMMAPLASTY Bilateral 2009   TUBAL LIGATION     Patient Active Problem List   Diagnosis Date Noted   Class 2 severe obesity due to excess calories with serious comorbidity and body mass index (BMI) of 35.0 to 35.9 in adult [facility]) 03/10/2021   Diabetic peripheral neuropathy associated with type 2 diabetes mellitus (HCC) 12/18/2020   Hyponatremia - HCTZ 07/15/2020   Spondylosis of lumbar region without myelopathy or radiculopathy 07/14/2020   Cardiac pacemaker in situ 03/19/2019   CKD (chronic kidney disease) stage 3, GFR 30-59 ml/min (HCC) 10/04/2016   Obesity (BMI 30-39.9) 01/12/2016   NSVT (nonsustained ventricular tachycardia) 12/05/2015   Primary localized osteoarthrosis of left lower leg 07/16/2014   History of complete heart block 10/04/2012   Combined hyperlipidemia associated with type 2 diabetes mellitus (HCC) 11/04/2011   Type 2 diabetes mellitus with stage 3b chronic kidney disease, without long-term current use of insulin (HCC) 11/04/2011   Age-related osteoporosis without fracture 06/01/2011   Vitamin D deficiency 04/16/2011   Hypertension  associated with diabetes (HCC) 01/11/2011    PCP: Asencion Partridge, MD  REFERRING PROVIDER:  Windle Guard, MD    REFERRING DIAG: M54.50 (ICD-10-CM) - Low back pain, unspecified   Rationale for Evaluation and Treatment: Rehabilitation  THERAPY DIAG:  Other low back pain  Abnormal posture  Difficulty in walking, not elsewhere classified  Bilateral hip pain  ONSET DATE: Started in the 90s on and off and began again about 5 years ago  SUBJECTIVE:  Pt reports she did really well after last session. She reports that she has been working on getting up from chairs without using hands. She meets with surgeon today ,"I wanna hold off on surgery".   PERTINENT HISTORY:  T2DM, h/o c-section, OP, *pacemaker present*  PAIN:  Are you having pain? Yes:  NPRS scale: 3-4/10 Pain location:lower back (points to beltline) Pain description: achy Aggravating factors: standing, walking, morning stiffness Relieving factors: sit down  PRECAUTIONS: ICD/Pacemaker  WEIGHT BEARING RESTRICTIONS: No  FALLS:  Has patient fallen in last 6 months? No  OCCUPATION: not working  PLOF: Independent  PATIENT GOALS: Decrease back pain, improve walking tolerance  OBJECTIVE:   DIAGNOSTIC FINDINGS:  MRI 10/10: T12-L1: No significant disc bulge. No spinal canal stenosis or neural foraminal narrowing.   L1-L2: No significant disc bulge. Mild facet arthropathy. No spinal canal stenosis or neural foraminal narrowing.   L2-L3: No significant disc bulge. Mild facet arthropathy. No spinal canal stenosis or neural foraminal narrowing.   L3-L4: Minimal disc bulge. Mild facet arthropathy. No spinal canal stenosis or neural foraminal narrowing.   L4-L5: Grade 1 anterolisthesis with disc unroofing and moderate disc bulge.  Moderate to severe facet arthropathy. Ligamentum flavum hypertrophy. Moderate to severe spinal canal stenosis. Effacement of the lateral recesses. Mild left neural foraminal narrowing.   L5-S1: Disc height loss and moderate disc bulge with superimposed right subarticular protrusion, which effaces the right lateral recess and contacts the descending right S1 nerve roots. Mild facet arthropathy. Narrowing of the left lateral recess. No spinal canal stenosis or neural foraminal narrowing.   IMPRESSION: 1. L4-L5 moderate to severe spinal canal stenosis and mild left neural foraminal narrowing. Effacement of the lateral recesses at this level likely compresses the descending L5 nerve roots. 2. L5-S1 right subarticular disc protrusion effaces the right lateral recess and contacts the descending right S1 nerve roots. Narrowing of the left lateral recess at this level likely affects the descending left S1 nerve roots.    PATIENT SURVEYS:  FOTO 20  SENSATION: N/T bil feet due to DM.   MUSCLE LENGTH: Bilateral hamstring tightness limiting passive straight leg raise to approximately 50 degrees bilaterally  POSTURE: flexed at hips with incr lumbar lordosis  PALPATION: Denied tenderness to palpation in bilateral SIJ  LUMBAR ROM:   AROM eval  Flexion Proximal 1/3 shin  Extension   Right lateral flexion   Left lateral flexion   Right rotation   Left rotation    (Blank rows = not tested)  LOWER EXTREMITY MMT:    MMT Right eval Left eval  Hip flexion    Hip extension    Hip abduction 4+/5 4+/5  Hip adduction    Hip internal rotation    Hip external rotation    Knee flexion    Knee extension    Ankle dorsiflexion    Ankle plantarflexion    Ankle inversion    Ankle eversion     (Blank rows = not tested)  GAIT: Distance walked: walking to clinic Level of assistance: Complete Independence Comments: flexed posture, short step length with flat foot gait and slow cadence.    TODAY'S TREATMENT:  Treatment                           01/05/22 STS with forward arm reach from bench on land x 2  Pt seen for aquatic therapy today.  Treatment took place in water 3.25-4.5 ft in depth at the [facility]. Temp of water was 91.  Pt entered/exited the pool via stairs with hand rail.  * no UE support:  walking forward/ backward; marching in place (cues for upright posture)  * side stepping with shoulder abdct/ add; progressed to using rainbow hand floats * rainbow hand floats for support: LE circumduction backwards / forwards 2 laps (improved);  heel raises x 10; 1/2 diamonds x 10 each * STS on bench with feet on blue step with forward arm reach and cues for hip hinge, core engaged x 10 * TrA set with 1/2 blue noodle press to thighs x 5, progressed to thin squoodle x 5 * holding wall: wall push up/offs x 10 with cues for neutral spine (and head) * high knee marching - touching rainbow hand float to knee (pushing it under the water)  * return to walking - backwards/ forwards    Pt requires the buoyancy and hydrostatic pressure of water for support, and to offload joints by unweighting joint load by at least 50 % in navel deep water and by at least 75-80% in chest to neck deep water.  Viscosity of the water is needed for resistance of strengthening. Water current perturbations provides challenge to standing balance requiring increased core activation.  PATIENT EDUCATION:  Education details: Geophysicist/field seismologist of condition, POC, HEP, exercise form/rationale Person educated: Patient Education method: Explanation, Demonstration, Tactile cues, Verbal cues, and Handouts Education comprehension: verbalized understanding, returned demonstration, verbal cues required, tactile cues required, and needs further education  HOME EXERCISE PROGRAM: [id number]    ASSESSMENT:  CLINICAL IMPRESSION: Positive response to  aquatic sessions with overall reduction in pain.  Pt reports she ambulates pain-free while in the water. Improved tolerance for RLE circumduction and 1/2 diamonds; no Rt hip.  Requires minor cues for posture and form throughout session. Able to complete more volume of exercises this visit.   She remains an excellent candidate for aquatic therapy and will benefit from the properties of water to improve muscle length and strength , decrease pain to improve functional mobility and safety. Goals are ongoing.    Initial Assessment: Patient is a 72 y.o. F who was seen today for physical therapy evaluation and treatment for chronic low back pain and bilateral hip pain.  Hip abduction strength is not concerning at this time but will benefit from strengthening and glutes to improve upright posture without increasing lumbar extension.  Anterior listhesis noted in MRI report and will continue to remain aware of any neural symptoms running distally.  OBJECTIVE IMPAIRMENTS: Abnormal gait, decreased activity tolerance, decreased balance, difficulty walking, decreased ROM, decreased strength, increased muscle spasms, impaired flexibility, improper body mechanics, postural dysfunction, and pain.   ACTIVITY LIMITATIONS: carrying, lifting, bending, standing, squatting, stairs, transfers, bed mobility, reach over head, and locomotion level  PARTICIPATION LIMITATIONS: meal prep, cleaning, laundry, and community activity  PERSONAL FACTORS: Fitness and Time since onset of injury/illness/exacerbation are also affecting patient's functional outcome.   REHAB POTENTIAL: Good  CLINICAL DECISION MAKING: Stable/uncomplicated  EVALUATION COMPLEXITY: Low   GOALS: Goals reviewed with patient? Yes  SHORT TERM GOALS: Target date: 12/24/2021   Patient will verbalize performance of daily stretches  upon return from traveling around the holiday Baseline: Not  currently performing any daily exercise routine Goal status: INITIAL   LONG TERM GOALS: Target date: POC date  Patient will be able to reach overhead without increased back pain Baseline:  Goal status: INITIAL  2.  Patient will wake in the morning with minimal discomfort from back Baseline:  Goal status: INITIAL  3.  Patient will be able to walk through grocery store without limitation by back pain Baseline:  Goal status: INITIAL  4.  Patient will meet stated Foto goal Baseline:  Goal status: INITIAL  5.  Patient will demonstrate stacked posture in an upright stance Baseline:  Goal status: INITIAL   PLAN:  PT FREQUENCY: 1-2x/week  PT DURATION: other: 13 weeks, extended due to travel around the holidays  PLANNED INTERVENTIONS: Therapeutic exercises, Therapeutic activity, Neuromuscular re-education, Balance training, Gait training, Patient/Family education, Self Care, Joint mobilization, Stair training, Aquatic Therapy, Dry Needling, Spinal mobilization, Cryotherapy, Moist heat, Taping, Traction, Manual therapy, and Re-evaluation.  PLAN FOR NEXT SESSION: Review daily stretches and continue aquatic therapy for core strengthening hip flexor lengthening glutes engagement  Mayer Camel, PTA 01/05/22 1:14 PM [facility] [address] [place], [place], [address] Phone: [phone]   Fax:  [phone]     Referring diagnosis? M54.50 Treatment diagnosis? (if different than referring diagnosis) M54.59 R29.3 R26.2 What was this (referring dx) caused by? []  Surgery []  Fall [x]  Ongoing issue []  Arthritis []  Other: ____________  Laterality: []  Rt []  Lt [x]  Both  Check all possible CPT codes:  *CHOOSE 10 OR LESS*    []  97110 (Therapeutic Exercise)  []  92507 (SLP Treatment)  []  (Neuro Re-ed)   []  92526 (Swallowing Treatment)   []  97116 (Gait Training)   []  (Cognitive Training, 1st 15 minutes) []   97140 (Manual Therapy)   []  97130 (Cognitive Training, each add'l 15 minutes)  []  97164 (Re-evaluation)                              []  Other, List CPT Code ____________  []  97530 (Therapeutic Activities)     []  97535 (Self Care)   [x]  All codes above (97110 - 97535)  [x]  97012 (Mechanical Traction)  []  97014 (E-stim Unattended)  []  97032 (E-stim manual)  []  97033 (Ionto)  []  (Ultrasound) []  97750 (Physical Performance Training) [x]  (Aquatic Therapy) []  97016 (Vasopneumatic Device) []  (Paraffin) []  97034 (Contrast Bath) []  97597 (Wound Care 1st 20 sq cm) []  97598 (Wound Care each add'l 20 sq cm) []  97760 (Orthotic Fabrication, Fitting, Training Initial) []  (Prosthetic Management and Training Initial) []  (Orthotic or Prosthetic Training/ Modification Subsequent)

## 2022-01-06 ENCOUNTER — Ambulatory Visit (INDEPENDENT_AMBULATORY_CARE_PROVIDER_SITE_OTHER): Payer: Medicare PPO | Admitting: Family Medicine

## 2022-01-06 ENCOUNTER — Encounter: Payer: Self-pay | Admitting: Family Medicine

## 2022-01-06 VITALS — BP 108/60 | HR 87 | Temp 97.7°F | Ht 62.0 in | Wt 192.2 lb

## 2022-01-06 DIAGNOSIS — N1831 Chronic kidney disease, stage 3a: Secondary | ICD-10-CM | POA: Diagnosis not present

## 2022-01-06 DIAGNOSIS — M48062 Spinal stenosis, lumbar region with neurogenic claudication: Secondary | ICD-10-CM | POA: Diagnosis not present

## 2022-01-06 DIAGNOSIS — N1832 Chronic kidney disease, stage 3b: Secondary | ICD-10-CM | POA: Diagnosis not present

## 2022-01-06 DIAGNOSIS — I739 Peripheral vascular disease, unspecified: Secondary | ICD-10-CM | POA: Insufficient documentation

## 2022-01-06 DIAGNOSIS — E1159 Type 2 diabetes mellitus with other circulatory complications: Secondary | ICD-10-CM

## 2022-01-06 DIAGNOSIS — E1169 Type 2 diabetes mellitus with other specified complication: Secondary | ICD-10-CM

## 2022-01-06 DIAGNOSIS — I152 Hypertension secondary to endocrine disorders: Secondary | ICD-10-CM | POA: Diagnosis not present

## 2022-01-06 DIAGNOSIS — E871 Hypo-osmolality and hyponatremia: Secondary | ICD-10-CM

## 2022-01-06 DIAGNOSIS — M818 Other osteoporosis without current pathological fracture: Secondary | ICD-10-CM | POA: Diagnosis not present

## 2022-01-06 DIAGNOSIS — I4729 Other ventricular tachycardia: Secondary | ICD-10-CM | POA: Diagnosis not present

## 2022-01-06 DIAGNOSIS — E1122 Type 2 diabetes mellitus with diabetic chronic kidney disease: Secondary | ICD-10-CM | POA: Diagnosis not present

## 2022-01-06 DIAGNOSIS — Z0000 Encounter for general adult medical examination without abnormal findings: Secondary | ICD-10-CM | POA: Diagnosis not present

## 2022-01-06 DIAGNOSIS — Z6835 Body mass index (BMI) 35.0-35.9, adult: Secondary | ICD-10-CM

## 2022-01-06 DIAGNOSIS — E782 Mixed hyperlipidemia: Secondary | ICD-10-CM

## 2022-01-06 LAB — LIPID PANEL
Cholesterol: 212 mg/dL — ABNORMAL HIGH (ref 0–200)
HDL: 66.1 mg/dL (ref >39.00)
LDL Cholesterol: 120 mg/dL — ABNORMAL HIGH (ref 0–99)
NonHDL: 146.06
Total CHOL/HDL Ratio: 3
Triglycerides: 129 mg/dL (ref 0.0–149.0)
VLDL: 25.8 mg/dL (ref 0.0–40.0)

## 2022-01-06 LAB — COMPREHENSIVE METABOLIC PANEL
ALT: 21 U/L (ref 0–35)
AST: 21 U/L (ref 0–37)
Albumin: 4.5 g/dL (ref 3.5–5.2)
Alkaline Phosphatase: 48 U/L (ref 39–117)
BUN: 33 mg/dL — ABNORMAL HIGH (ref 6–23)
CO2: 30 mEq/L (ref 19–32)
Calcium: 10.4 mg/dL (ref 8.4–10.5)
Chloride: 99 mEq/L (ref 96–112)
Creatinine, Ser: 1.41 mg/dL — ABNORMAL HIGH (ref 0.40–1.20)
GFR: 37.69 mL/min — ABNORMAL LOW (ref >60.00)
Glucose, Bld: 112 mg/dL — ABNORMAL HIGH (ref 70–99)
Potassium: 5.6 mEq/L — ABNORMAL HIGH (ref 3.5–5.1)
Sodium: 138 mEq/L (ref 135–145)
Total Bilirubin: 0.5 mg/dL (ref 0.2–1.2)
Total Protein: 7.8 g/dL (ref 6.0–8.3)

## 2022-01-06 LAB — CBC WITH DIFFERENTIAL/PLATELET
Basophils Absolute: 0 10*3/uL (ref 0.0–0.1)
Basophils Relative: 0.8 % (ref 0.0–3.0)
Eosinophils Absolute: 0.1 10*3/uL (ref 0.0–0.7)
Eosinophils Relative: 1.9 % (ref 0.0–5.0)
HCT: 41.1 % (ref 36.0–46.0)
Hemoglobin: 13.7 g/dL (ref 12.0–15.0)
Lymphocytes Relative: 48.5 % — ABNORMAL HIGH (ref 12.0–46.0)
Lymphs Abs: 1.8 10*3/uL (ref 0.7–4.0)
MCHC: 33.4 g/dL (ref 30.0–36.0)
MCV: 82.5 fl (ref 78.0–100.0)
Monocytes Absolute: 0.4 10*3/uL (ref 0.1–1.0)
Monocytes Relative: 10.3 % (ref 3.0–12.0)
Neutro Abs: 1.4 10*3/uL (ref 1.4–7.7)
Neutrophils Relative %: 38.5 % — ABNORMAL LOW (ref 43.0–77.0)
Platelets: 249 10*3/uL (ref 150.0–400.0)
RBC: 4.98 Mil/uL (ref 3.87–5.11)
RDW: 16.1 % — ABNORMAL HIGH (ref 11.5–15.5)
WBC: 3.7 10*3/uL — ABNORMAL LOW (ref 4.0–10.5)

## 2022-01-06 LAB — MICROALBUMIN / CREATININE URINE RATIO
Creatinine,U: 57.6 mg/dL
Microalb Creat Ratio: 1.2 mg/g (ref 0.0–30.0)
Microalb, Ur: <0.7 mg/dL (ref 0.0–1.9)

## 2022-01-06 LAB — HEMOGLOBIN A1C: Hgb A1c MFr Bld: 7.6 % — ABNORMAL HIGH (ref 4.6–6.5)

## 2022-01-06 MED ORDER — LISINOPRIL-HYDROCHLOROTHIAZIDE 20-12.5 MG PO TABS: 1.0000 | ORAL_TABLET | Freq: Every day | ORAL | 3 refills | Status: DC

## 2022-01-06 NOTE — Patient Instructions (Signed)
Please return in 6 months for diabetes and blood pressure recheck.   I have lowered the dose of your blood pressure pill a little, the lisinopril hctz from 20/25 down to 20/12.5 daily. I don't want your blood pressure running too low. I have sent this in to [facility].  Please ask if and when you had your 2nd shingrix vaccination. If you have not yet had it, go ahead and get and send me a message.   Travel safely tomorrow and I am sorry for your loss.   I will release your lab results to you on your MyChart account with further instructions. You may see the results before I do, but when I review them I will send you a message with my report or have my assistant call you if things need to be discussed. Please reply to my message with any questions. Thank you!   If you have any questions or concerns, please don't hesitate to send me a message via MyChart or call the office at [phone]. Thank you for visiting with [place] today! It's our pleasure caring for you.

## 2022-01-06 NOTE — Progress Notes (Signed)
Subjective  Chief Complaint  Patient presents with   Annual Exam    Pt here for Annual exam and is currently fasting     HPI: Amy Bartlett is a 72 y.o. female who presents to [facility] today for a Female Wellness Visit. She also has the concerns and/or needs as listed above in the chief complaint. These will be addressed in addition to the Health Maintenance Visit.   Wellness Visit: annual visit with health maintenance review and exam without Pap  HM: Mammogram due in February, bone density due at the end of this year.  Colorectal screening up-to-date.  She did have her first Shingrix but not sure about her second.  She will check with [facility].  Other immunizations are current.  Of note, traveling home to [place] tomorrow to attend the funeral of her brother, end-stage renal disease.  Chronic disease f/u and/or acute problem visit: (deemed necessary to be done in addition to the wellness visit): Diabetes: She feels her control is good.  No symptoms of hyperglycemia.  She continues on [place].  No adverse effects.  No hypoglycemia.  Intermittent neuropathic symptoms in her feet but not bothersome.  No longer taking gabapentin. Blood pressure: Running a bit low over the last several months.  Denies symptoms of orthostatic hypotension.  No chest pain or palpitations.  Reviewed recent cardiology notes.  Stable from cardiovascular standpoint.  Taking Zestoretic 20/25 daily. Osteoporosis on Fosamax and tolerating well.  No longer having reflux.  No longer needing omeprazole.  Vitamin D and calcium supplements.  She continues to exercise Chronic chronic hyponatremia on HCTZ.  Due for recheck Hyperlipidemia taking simvastatin 10 mg daily without adverse effects.  Fasting for recheck today. Lumbar stenosis: By MRI.  Physical therapy is helping  Assessment  1. Annual physical exam   2. Type 2 diabetes mellitus with stage 3b chronic  kidney disease, without long-term current use of insulin ([place])   3. Hypertension associated with diabetes ([place])   4. Stage 3a chronic kidney disease ([place])   5. Combined hyperlipidemia associated with type 2 diabetes mellitus (HCC)   6. Age-related osteoporosis without fracture   7. Hyponatremia - HCTZ   8. NSVT (nonsustained ventricular tachycardia) (HCC) Chronic  9. Class 2 severe obesity due to excess calories with serious comorbidity and body mass index (BMI) of 35.0 to 35.9 in adult [facility]) Chronic  10. Spinal stenosis of lumbar region with neurogenic claudication      Plan  Female Wellness Visit: Age appropriate Health Maintenance and Prevention measures were discussed with patient. Included topics are cancer screening recommendations, ways to keep healthy (see AVS) including dietary and exercise recommendations, regular eye and dental care, use of seat belts, and avoidance of moderate alcohol use and tobacco use.  BMI: discussed patient's BMI and encouraged positive lifestyle modifications to help get to or maintain a target BMI. HM needs and immunizations were addressed and ordered. See below for orders. See HM and immunization section for updates.  She will check to see if she is needing her second Shingrix Routine labs and screening tests ordered including cmp, cbc and lipids where appropriate. Discussed recommendations regarding Vit D and calcium supplementation (see AVS)  Chronic disease management visit and/or acute problem visit: Diabetes: Clinically controlled.  Continue Jardiance 25 daily and Trulicity 1.5 mg weekly.  Tolerating both well.  Check A1c.  Monitor feet.  Eye exam current no retinopathy Cardiovascular disease: Hypertension is well-controlled but  will decrease Zestoretic to 20/12.5 to avoid hypotension.  Check renal function electrolytes.  Has chronic kidney disease.  Monitor sodium levels.  History of heart block with pacer recently interrogated. Recheck lipids,  fasting, on Crestor 10.  Check LFTs Chronic kidney disease: Avoid NSAIDs.  Check lab work Spinal stenosis: Continue physical therapy.  Improving Osteoporosis continue Fosamax.  Recheck bone density later this year.  Continue exercise  Follow up: 6 months to recheck hypertension and diabetes Orders Placed This Encounter  Procedures   CBC with Differential/Platelet   Comprehensive metabolic panel   Hemoglobin A1c   Lipid panel   Microalbumin / creatinine urine ratio   Meds ordered this encounter  Medications   lisinopril-hydrochlorothiazide (ZESTORETIC) 20-12.5 MG tablet    Sig: Take 1 tablet by mouth daily.    Dispense:  90 tablet    Refill:  3      Body mass index is 35.15 kg/m. Wt Readings from Last 3 Encounters:  01/06/22 192 lb 3.2 oz (87.2 kg)  10/30/21 194 lb 6.4 oz (88.2 kg)  09/15/21 193 lb (87.5 kg)     Patient Active Problem List   Diagnosis Date Noted   Class 2 severe obesity due to excess calories with serious comorbidity and body mass index (BMI) of 35.0 to 35.9 in adult [facility]) 03/10/2021    Priority: High   Diabetic peripheral neuropathy associated with type 2 diabetes mellitus ([place]) 12/18/2020    Priority: High   Cardiac pacemaker in situ 03/19/2019    Priority: High    Added automatically from request for surgery [id number]    CKD (chronic kidney disease) stage 3, GFR 30-59 ml/min ([place]) 10/04/2016    Priority: High   NSVT (nonsustained ventricular tachycardia) 12/05/2015    Priority: High    Noted on PPM.  Echo on 08/2015 EF normal     History of complete heart block 10/04/2012    Priority: High    Overview:  Dr. Georg Ruddle, S/p pacemaker, stable 2014/cla No ischemia by stress testing 2011    Combined hyperlipidemia associated with type 2 diabetes mellitus ([place]) 11/04/2011    Priority: High   Type 2 diabetes mellitus with stage 3b chronic kidney disease, without long-term current use of insulin ([place]) 11/04/2011    Priority: High   Hypertension  associated with diabetes ([place]) 01/11/2011    Priority: High   Spondylosis of lumbar region without myelopathy or radiculopathy 07/14/2020    Priority: Medium    Obesity (BMI 30-39.9) 01/12/2016    Priority: Medium    Primary localized osteoarthrosis of left lower leg 07/16/2014    Priority: Medium     Talar dome, toe    Age-related osteoporosis without fracture 06/01/2011    Priority: Medium     Dexa 2022: T = -1.6 lowest, improved on fosamax x 3 years. Continue x 2 more years and recheck dexa 2024. Drug holiday if stable at that time. T = -2.6 04/2017 in L hip. New dx: started fosamax 06/2017 T = -1.2 05/2011; recheck at age 35.    Hyponatremia - HCTZ 07/15/2020    Priority: Low   Vitamin D deficiency 04/16/2011    Priority: Low   Peripheral vascular disease with claudication ([place]) 01/06/2022   Spinal stenosis of lumbar region with neurogenic claudication 01/06/2022   Health Maintenance  Topic Date Due   Zoster Vaccines- Shingrix (2 of 2) 04/01/2021   Diabetic kidney evaluation - eGFR measurement  12/18/2021   Diabetic kidney evaluation - Urine ACR  12/18/2021  COVID-19 Vaccine (5 - 2023-24 season) 01/22/2022 (Originally 09/04/2021)   MAMMOGRAM  02/24/2022   OPHTHALMOLOGY EXAM  03/03/2022   HEMOGLOBIN A1C  03/16/2022   DEXA SCAN  09/19/2022   Medicare Annual Wellness (AWV)  10/31/2022   FOOT EXAM  01/07/2023   COLONOSCOPY (Pts 45-57yr Insurance coverage will need to be confirmed)  02/22/2027   Pneumonia Vaccine 72 Years old  Completed   INFLUENZA VACCINE  Completed   HPV VACCINES  Aged Out   DTaP/Tdap/Td  Discontinued   Hepatitis C Screening  Discontinued   Immunization History  Administered Date(s) Administered   Fluad Quad(high Dose 65+) 09/08/2018, 11/12/2019, 10/17/2020, 10/30/2021   PFIZER(Purple Top)SARS-COV-2 Vaccination 02/11/2019, 03/04/2019, 10/15/2019   PNEUMOCOCCAL CONJUGATE-20 12/18/2020   Pfizer Covid-19 Vaccine Bivalent Booster 167yr& up 11/18/2020    Tdap 04/14/2011   Zoster Recombinat (Shingrix) 02/04/2021   Zoster, Live 04/14/2011   We updated and reviewed the patient's past history in detail and it is documented below. Allergies: Patient has No Known Allergies. Past Medical History Patient  has a past medical history of Arthritis, Chicken pox, Diabetes mellitus ([place] Hyperlipidemia, Hypertension, Hyponatremia - HCTZ (07/15/2020), and Osteopenia. Past Surgical History Patient  has a past surgical history that includes Reduction mammaplasty (Bilateral, 2009); Cardiac pacemaker placement; Tubal ligation; and Cesarean section (1974, 1977, 1979). Family History: Patient family history includes Arthritis in her father; Diabetes in her brother; Glaucoma in her mother; Healthy in her daughter and daughter; Heart disease in her mother; Hypertension in her mother; Kidney failure in her brother; Osteoporosis in her mother; Pneumonia in her father; Stroke in her brother. Social History:  Patient  reports that she has never smoked. She has never used smokeless tobacco. She reports that she does not drink alcohol and does not use drugs.  Review of Systems: Constitutional: negative for fever or malaise Ophthalmic: negative for photophobia, double vision or loss of vision Cardiovascular: negative for chest pain, dyspnea on exertion, or new LE swelling Respiratory: negative for SOB or persistent cough Gastrointestinal: negative for abdominal pain, change in bowel habits or melena Genitourinary: negative for dysuria or gross hematuria, no abnormal uterine bleeding or disharge Musculoskeletal: negative for new gait disturbance or muscular weakness Integumentary: negative for new or persistent rashes, no breast lumps Neurological: negative for TIA or stroke symptoms Psychiatric: negative for SI or delusions Allergic/Immunologic: negative for hives  Patient Care Team    Relationship Specialty Notifications Start End  AnLeamon ArntMD PCP -  General Family Medicine  06/28/17   GrClent JacksMD Consulting Physician Ophthalmology  06/28/17   SaRaina MinaMD Referring Physician Nephrology  06/28/17   DrMarlane HatcherMD Referring Physician Cardiology  06/28/17   Dr. ChGerlene BurdockDentistry  06/28/17     Objective  Vitals: BP 108/60   Pulse 87   Temp 97.7 F (36.5 C)   Ht _0  (1.575 m)   Wt 192 lb 3.2 oz (87.2 kg)   SpO2 99%   BMI 35.15 kg/m  General:  Well developed, well nourished, no acute distress  Psych:  Alert and orientedx3,normal mood and affect HEENT:  Normocephalic, atraumatic, non-icteric sclera,  supple neck without adenopathy, mass or thyromegaly Cardiovascular:  Normal S1, S2, RRR without gallop, rub or murmur Respiratory:  Good breath sounds bilaterally, CTAB with normal respiratory effort Gastrointestinal: normal bowel sounds, soft, non-tender, no noted masses. No HSM MSK: no deformities, contusions. Joints are without erythema or swelling.  Skin:  Warm, no rashes or suspicious  lesions noted Neurologic:    Mental status is normal. CN 2-11 are normal. Gross motor and sensory exams are normal. Normal gait. No tremor Diabetic Foot Exam: Appearance - no lesions, ulcers or calluses Skin - no sigificant pallor or erythema Monofilament testing - sensitive bilaterally in following locations:  Right - Great toe, medial, central, lateral ball and posterior foot intact  Left - Great toe, medial, central, lateral ball and posterior foot intact Pulses - +2 distally bilaterally   Commons side effects, risks, benefits, and alternatives for medications and treatment plan prescribed today were discussed, and the patient expressed understanding of the given instructions. Patient is instructed to call or message via MyChart if he/she has any questions or concerns regarding our treatment plan. No barriers to understanding were identified. We discussed Red Flag symptoms and signs in detail. Patient expressed understanding  regarding what to do in case of urgent or emergency type symptoms.  Medication list was reconciled, printed and provided to the patient in AVS. Patient instructions and summary information was reviewed with the patient as documented in the AVS. This note was prepared with assistance of Dragon voice recognition software. Occasional wrong-word or sound-a-like substitutions may have occurred due to the inherent limitations of voice recognition software

## 2022-01-07 ENCOUNTER — Ambulatory Visit (HOSPITAL_BASED_OUTPATIENT_CLINIC_OR_DEPARTMENT_OTHER): Payer: Medicare PPO | Admitting: Physical Therapy

## 2022-01-07 NOTE — Progress Notes (Signed)
Please call patient: I have reviewed his/her lab results. High potassium; needs to return for recheck because could be a lab error: order bmp and schedule lab visit please Her diabetic control is worse with A1c 7.6 and her cholesterol is high again. Is she taking her diabetes and cholesterol medications? Needs to restart the statin and needs f/u in 3 months to check diabetes. Needs to eat better again.

## 2022-01-08 ENCOUNTER — Other Ambulatory Visit: Payer: Self-pay

## 2022-01-08 DIAGNOSIS — E875 Hyperkalemia: Secondary | ICD-10-CM

## 2022-01-11 ENCOUNTER — Other Ambulatory Visit: Payer: Self-pay

## 2022-01-12 ENCOUNTER — Other Ambulatory Visit: Payer: Medicare PPO

## 2022-01-12 ENCOUNTER — Encounter (HOSPITAL_BASED_OUTPATIENT_CLINIC_OR_DEPARTMENT_OTHER): Payer: Self-pay | Admitting: Physical Therapy

## 2022-01-12 ENCOUNTER — Ambulatory Visit (HOSPITAL_BASED_OUTPATIENT_CLINIC_OR_DEPARTMENT_OTHER): Payer: Medicare PPO | Admitting: Physical Therapy

## 2022-01-12 DIAGNOSIS — M25551 Pain in right hip: Secondary | ICD-10-CM

## 2022-01-12 DIAGNOSIS — R262 Difficulty in walking, not elsewhere classified: Secondary | ICD-10-CM

## 2022-01-12 DIAGNOSIS — R293 Abnormal posture: Secondary | ICD-10-CM

## 2022-01-12 DIAGNOSIS — E875 Hyperkalemia: Secondary | ICD-10-CM | POA: Diagnosis not present

## 2022-01-12 DIAGNOSIS — M5459 Other low back pain: Secondary | ICD-10-CM

## 2022-01-12 NOTE — Therapy (Signed)
OUTPATIENT PHYSICAL THERAPY THORACOLUMBAR TREATMENT   Patient Name: Amy Bartlett MRN: [id number] DOB:May 14, 1950, 72 y.o., female Today's Date: 01/12/2022  END OF SESSION:  PT End of Session - 01/12/22 1409     Visit Number 5    Number of Visits 20    Date for PT Re-Evaluation 02/18/22    Authorization Type [employer] + [employer]    Progress Note Due on Visit 10    PT Start Time 1116    PT Stop Time 1200    PT Time Calculation (min) 44 min    Activity Tolerance Patient tolerated treatment well    Behavior During Therapy WFL for tasks assessed/performed             Past Medical History:  Diagnosis Date   Arthritis    Chicken pox    Diabetes mellitus (HCC)    Hyperlipidemia    Hypertension    Hyponatremia - HCTZ 07/15/2020   Osteopenia    Past Surgical History:  Procedure Laterality Date   CARDIAC PACEMAKER PLACEMENT     CESAREAN SECTION  1974, 1977, 1979   REDUCTION MAMMAPLASTY Bilateral 2009   TUBAL LIGATION     Patient Active Problem List   Diagnosis Date Noted   Peripheral vascular disease with claudication (HCC) 01/06/2022   Spinal stenosis of lumbar region with neurogenic claudication 01/06/2022   Class 2 severe obesity due to excess calories with serious comorbidity and body mass index (BMI) of 35.0 to 35.9 in adult [facility]) 03/10/2021   Diabetic peripheral neuropathy associated with type 2 diabetes mellitus (HCC) 12/18/2020   Hyponatremia - HCTZ 07/15/2020   Spondylosis of lumbar region without myelopathy or radiculopathy 07/14/2020   Cardiac pacemaker in situ 03/19/2019   CKD (chronic kidney disease) stage 3, GFR 30-59 ml/min (HCC) 10/04/2016   Obesity (BMI 30-39.9) 01/12/2016   NSVT (nonsustained ventricular tachycardia) 12/05/2015   Primary localized osteoarthrosis of left lower leg 07/16/2014   History of complete heart block 10/04/2012   Combined hyperlipidemia associated with type 2 diabetes mellitus (HCC) 11/04/2011   Type 2 diabetes mellitus  with stage 3b chronic kidney disease, without long-term current use of insulin (HCC) 11/04/2011   Age-related osteoporosis without fracture 06/01/2011   Vitamin D deficiency 04/16/2011   Hypertension associated with diabetes (HCC) 01/11/2011    PCP: Asencion Partridge, MD  REFERRING PROVIDER:  Windle Guard, MD    REFERRING DIAG: M54.50 (ICD-10-CM) - Low back pain, unspecified   Rationale for Evaluation and Treatment: Rehabilitation  THERAPY DIAG:  Other low back pain  Abnormal posture  Difficulty in walking, not elsewhere classified  Bilateral hip pain  ONSET DATE: Started in the 90s on and off and began again about 5 years ago  SUBJECTIVE:  Pt reports she saw Dr Shon Baton and is not ready yet for the surgery".   PERTINENT HISTORY:  T2DM, h/o c-section, OP, *pacemaker present*  PAIN:  Are you having pain? Yes:  NPRS scale: 3/10 Pain location:lower back (points to beltline) Pain description: achy Aggravating factors: standing, walking, morning stiffness Relieving factors: sit down  PRECAUTIONS: ICD/Pacemaker  WEIGHT BEARING RESTRICTIONS: No  FALLS:  Has patient fallen in last 6 months? No  OCCUPATION: not working  PLOF: Independent  PATIENT GOALS: Decrease back pain, improve walking tolerance  OBJECTIVE:   DIAGNOSTIC FINDINGS:  MRI 10/10: T12-L1: No significant disc bulge. No spinal canal stenosis or neural foraminal narrowing.   L1-L2: No significant disc bulge. Mild facet arthropathy. No spinal canal stenosis or neural foraminal narrowing.   L2-L3: No significant disc bulge. Mild facet arthropathy. No spinal canal stenosis or neural foraminal narrowing.   L3-L4: Minimal disc bulge. Mild facet arthropathy. No spinal canal stenosis or neural foraminal narrowing.   L4-L5:  Grade 1 anterolisthesis with disc unroofing and moderate disc bulge. Moderate to severe facet arthropathy. Ligamentum flavum hypertrophy. Moderate to severe spinal canal stenosis. Effacement of the lateral recesses. Mild left neural foraminal narrowing.   L5-S1: Disc height loss and moderate disc bulge with superimposed right subarticular protrusion, which effaces the right lateral recess and contacts the descending right S1 nerve roots. Mild facet arthropathy. Narrowing of the left lateral recess. No spinal canal stenosis or neural foraminal narrowing.   IMPRESSION: 1. L4-L5 moderate to severe spinal canal stenosis and mild left neural foraminal narrowing. Effacement of the lateral recesses at this level likely compresses the descending L5 nerve roots. 2. L5-S1 right subarticular disc protrusion effaces the right lateral recess and contacts the descending right S1 nerve roots. Narrowing of the left lateral recess at this level likely affects the descending left S1 nerve roots.    PATIENT SURVEYS:  FOTO 4  SENSATION: N/T bil feet due to DM.   MUSCLE LENGTH: Bilateral hamstring tightness limiting passive straight leg raise to approximately 50 degrees bilaterally  POSTURE: flexed at hips with incr lumbar lordosis  PALPATION: Denied tenderness to palpation in bilateral SIJ  LUMBAR ROM:   AROM eval  Flexion Proximal 1/3 shin  Extension   Right lateral flexion   Left lateral flexion   Right rotation   Left rotation    (Blank rows = not tested)  LOWER EXTREMITY MMT:    MMT Right eval Left eval  Hip flexion    Hip extension    Hip abduction 4+/5 4+/5  Hip adduction    Hip internal rotation    Hip external rotation    Knee flexion    Knee extension    Ankle dorsiflexion    Ankle plantarflexion    Ankle inversion    Ankle eversion     (Blank rows = not tested)  GAIT: Distance walked: walking to clinic Level of assistance: Complete Independence Comments:  flexed posture, short step length with flat foot gait and slow cadence.   TODAY'S TREATMENT:  Treatment                           01/12/22   Pt seen for aquatic therapy today.  Treatment took place in water 3.25-4.5 ft in depth at the [facility]. Temp of water was 91.  Pt entered/exited the pool via stairs with hand rail.  * no UE support:  walking forward/ backward; marching in place (cues for upright posture)  * side stepping with shoulder abdct/ add; progressed to using rainbow hand floats * rainbow hand floats for support: LE circumduction backwards / forwards 2 laps;  heel raises x 10; 1/2 diamonds x 10 each * STS on bench with feet on blue step with forward arm reach and cues for hip hinge, core engaged x 10 *hip hinge x 10 ue support on yellow noodle   * TrA set with solid blue noodle press to thighs 2x 5-6 *KB row with staggered LE then wide stance x 10-12 2 sets in each position     Pt requires the buoyancy and hydrostatic pressure of water for support, and to offload joints by unweighting joint load by at least 50 % in navel deep water and by at least 75-80% in chest to neck deep water.  Viscosity of the water is needed for resistance of strengthening. Water current perturbations provides challenge to standing balance requiring increased core activation.  PATIENT EDUCATION:  Education details: Geophysicist/field seismologist of condition, POC, HEP, exercise form/rationale Person educated: Patient Education method: Explanation, Demonstration, Tactile cues, Verbal cues, and Handouts Education comprehension: verbalized understanding, returned demonstration, verbal cues required, tactile cues required, and needs further education  HOME EXERCISE PROGRAM: [id number]   ASSESSMENT:  CLINICAL IMPRESSION: Foto to be completed next visit Pt reports she has decided against  surgery at this time.  Wants to continue with aquatic therapy since it has decreased pain significantly.  She is compliant with stretches/HEP daily.  She is demonstrating progress with strength as she is completing exercise with less recovery periods and reports of fatigue.  She will continue to benefit from skilled PT progressing towards all goals.     Initial Assessment: Patient is a 72 y.o. F who was seen today for physical therapy evaluation and treatment for chronic low back pain and bilateral hip pain.  Hip abduction strength is not concerning at this time but will benefit from strengthening and glutes to improve upright posture without increasing lumbar extension.  Anterior listhesis noted in MRI report and will continue to remain aware of any neural symptoms running distally.  OBJECTIVE IMPAIRMENTS: Abnormal gait, decreased activity tolerance, decreased balance, difficulty walking, decreased ROM, decreased strength, increased muscle spasms, impaired flexibility, improper body mechanics, postural dysfunction, and pain.   ACTIVITY LIMITATIONS: carrying, lifting, bending, standing, squatting, stairs, transfers, bed mobility, reach over head, and locomotion level  PARTICIPATION LIMITATIONS: meal prep, cleaning, laundry, and community activity  PERSONAL FACTORS: Fitness and Time since onset of injury/illness/exacerbation are also affecting patient's functional outcome.   REHAB POTENTIAL: Good  CLINICAL DECISION MAKING: Stable/uncomplicated  EVALUATION COMPLEXITY: Low   GOALS: Goals reviewed with patient? Yes  SHORT TERM GOALS: Target date: 12/24/2021   Patient will verbalize performance of daily stretches upon return from traveling around the holiday Baseline: Not currently performing any daily exercise routine Goal status: met 01/12/22   LONG TERM GOALS: Target date: POC date  Patient will be able to reach overhead without increased back pain Baseline:  Goal status:  INITIAL  2.  Patient will wake in the morning with minimal discomfort from back Baseline:  Goal status: INITIAL  3.  Patient will be able to walk through grocery store without limitation by back pain Baseline:  Goal status: INITIAL  4.  Patient will meet stated Foto goal Baseline:  Goal status: INITIAL  5.  Patient will demonstrate stacked posture in an upright stance Baseline:  Goal status: INITIAL   PLAN:  PT FREQUENCY: 1-2x/week  PT DURATION: other: 13 weeks, extended due to travel around the holidays  PLANNED INTERVENTIONS: Therapeutic exercises, Therapeutic activity, Neuromuscular re-education, Balance training, Gait training, Patient/Family education, Self Care, Joint mobilization, Stair training, Aquatic Therapy, Dry Needling, Spinal mobilization, Cryotherapy, Moist heat, Taping, Traction, Manual therapy, and Re-evaluation.  PLAN FOR NEXT SESSION: Review daily stretches and continue aquatic therapy for core strengthening hip flexor lengthening glutes engagement  Corrie Dandy Tomma Lightning) Yostin Malacara MPT 01/12/21 [address] [facility] [address] [place], [place], [address] Phone: [phone]   Fax:  [phone]     Referring diagnosis? M54.50 Treatment diagnosis? (if different than referring diagnosis) M54.59 R29.3 R26.2 What was this (referring dx) caused by? []  Surgery []  Fall [x]  Ongoing issue []  Arthritis []  Other: ____________  Laterality: []  Rt []  Lt [x]  Both  Check all possible CPT codes:  *CHOOSE 10 OR LESS*    []  97110 (Therapeutic Exercise)  []  (SLP Treatment)  []  97112 (Neuro Re-ed)   []  92526 (Swallowing Treatment)   []  97116 (Gait Training)   []  (Cognitive Training, 1st 15 minutes) []  97140 (Manual Therapy)   []  97130 (Cognitive Training, each add'l 15 minutes)  []  97164 (Re-evaluation)                              []  Other, List CPT Code ____________  []  97530 (Therapeutic Activities)     []   97535 (Self Care)   [x]  All codes above (97110 - 97535)  [x]  97012 (Mechanical Traction)  []  97014 (E-stim Unattended)  []  97032 (E-stim manual)  []  97033 (Ionto)  []  97035 (Ultrasound) []  97750 (Physical Performance Training) [x]  (Aquatic Therapy) []  97016 (Vasopneumatic Device) []  (Paraffin) []  97034 (Contrast Bath) []  97597 (Wound Care 1st 20 sq cm) []  97598 (Wound Care each add'l 20 sq cm) []  97760 (Orthotic Fabrication, Fitting, Training Initial) []  (Prosthetic Management and Training Initial) []  (Orthotic or Prosthetic Training/ Modification Subsequent)

## 2022-01-13 LAB — BASIC METABOLIC PANEL
BUN/Creatinine Ratio: 22 (calc) (ref 6–22)
BUN: 27 mg/dL — ABNORMAL HIGH (ref 7–25)
CO2: 25 mmol/L (ref 20–32)
Calcium: 9.7 mg/dL (ref 8.6–10.4)
Chloride: 102 mmol/L (ref 98–110)
Creat: 1.21 mg/dL — ABNORMAL HIGH (ref 0.60–1.00)
Glucose, Bld: 117 mg/dL — ABNORMAL HIGH (ref 65–99)
Potassium: 5.1 mmol/L (ref 3.5–5.3)
Sodium: 137 mmol/L (ref 135–146)

## 2022-01-14 ENCOUNTER — Ambulatory Visit (HOSPITAL_BASED_OUTPATIENT_CLINIC_OR_DEPARTMENT_OTHER): Payer: Medicare PPO | Admitting: Physical Therapy

## 2022-01-18 DIAGNOSIS — M5451 Vertebrogenic low back pain: Secondary | ICD-10-CM | POA: Diagnosis not present

## 2022-01-18 DIAGNOSIS — M48062 Spinal stenosis, lumbar region with neurogenic claudication: Secondary | ICD-10-CM | POA: Diagnosis not present

## 2022-01-18 DIAGNOSIS — M792 Neuralgia and neuritis, unspecified: Secondary | ICD-10-CM | POA: Diagnosis not present

## 2022-01-19 ENCOUNTER — Encounter (HOSPITAL_BASED_OUTPATIENT_CLINIC_OR_DEPARTMENT_OTHER): Payer: Self-pay

## 2022-01-19 ENCOUNTER — Ambulatory Visit (HOSPITAL_BASED_OUTPATIENT_CLINIC_OR_DEPARTMENT_OTHER): Payer: Medicare PPO

## 2022-01-19 DIAGNOSIS — M5459 Other low back pain: Secondary | ICD-10-CM | POA: Diagnosis not present

## 2022-01-19 DIAGNOSIS — R293 Abnormal posture: Secondary | ICD-10-CM

## 2022-01-19 DIAGNOSIS — M25551 Pain in right hip: Secondary | ICD-10-CM | POA: Diagnosis not present

## 2022-01-19 DIAGNOSIS — R262 Difficulty in walking, not elsewhere classified: Secondary | ICD-10-CM

## 2022-01-19 DIAGNOSIS — M25552 Pain in left hip: Secondary | ICD-10-CM | POA: Diagnosis not present

## 2022-01-19 NOTE — Therapy (Addendum)
OUTPATIENT PHYSICAL THERAPY THORACOLUMBAR TREATMENT   Patient Name: Amy Bartlett MRN: [id number] DOB:1950-01-07, 72 y.o., female Today's Date: 01/19/2022  END OF SESSION:  PT End of Session - 01/19/22 1207     Visit Number 6    Number of Visits 20    Date for PT Re-Evaluation 02/18/22    Authorization Type [employer] + [employer]    Progress Note Due on Visit 10    PT Start Time 1200    PT Stop Time 1242    PT Time Calculation (min) 42 min    Activity Tolerance Patient tolerated treatment well    Behavior During Therapy WFL for tasks assessed/performed              Past Medical History:  Diagnosis Date   Arthritis    Chicken pox    Diabetes mellitus (HCC)    Hyperlipidemia    Hypertension    Hyponatremia - HCTZ 07/15/2020   Osteopenia    Past Surgical History:  Procedure Laterality Date   CARDIAC PACEMAKER PLACEMENT     CESAREAN SECTION  1974, 1977, 1979   REDUCTION MAMMAPLASTY Bilateral 2009   TUBAL LIGATION     Patient Active Problem List   Diagnosis Date Noted   Peripheral vascular disease with claudication (HCC) 01/06/2022   Spinal stenosis of lumbar region with neurogenic claudication 01/06/2022   Class 2 severe obesity due to excess calories with serious comorbidity and body mass index (BMI) of 35.0 to 35.9 in adult [facility]) 03/10/2021   Diabetic peripheral neuropathy associated with type 2 diabetes mellitus (HCC) 12/18/2020   Hyponatremia - HCTZ 07/15/2020   Spondylosis of lumbar region without myelopathy or radiculopathy 07/14/2020   Cardiac pacemaker in situ 03/19/2019   CKD (chronic kidney disease) stage 3, GFR 30-59 ml/min (HCC) 10/04/2016   Obesity (BMI 30-39.9) 01/12/2016   NSVT (nonsustained ventricular tachycardia) 12/05/2015   Primary localized osteoarthrosis of left lower leg 07/16/2014   History of complete heart block 10/04/2012   Combined hyperlipidemia associated with type 2 diabetes mellitus (HCC) 11/04/2011   Type 2 diabetes mellitus  with stage 3b chronic kidney disease, without long-term current use of insulin (HCC) 11/04/2011   Age-related osteoporosis without fracture 06/01/2011   Vitamin D deficiency 04/16/2011   Hypertension associated with diabetes (HCC) 01/11/2011    PCP: Asencion Partridge, MD  REFERRING PROVIDER:  Windle Guard, MD    REFERRING DIAG: M54.50 (ICD-10-CM) - Low back pain, unspecified   Rationale for Evaluation and Treatment: Rehabilitation  THERAPY DIAG:  Other low back pain  Abnormal posture  Difficulty in walking, not elsewhere classified  ONSET DATE: Started in the 90s on and off and began again about 5 years ago  SUBJECTIVE:  Pt reports some RLE cramping since this morning. She reports improvement in low back pain. No pain at entry. Would like to get back to working out at the gym.   PERTINENT HISTORY:  T2DM, h/o c-section, OP, *pacemaker present*  PAIN:  Are you having pain? No  NPRS scale: 0/10 Pain location:lower back (points to beltline) Pain description: achy Aggravating factors: standing, walking, morning stiffness Relieving factors: sit down  PRECAUTIONS: ICD/Pacemaker  WEIGHT BEARING RESTRICTIONS: No  FALLS:  Has patient fallen in last 6 months? No  OCCUPATION: not working  PLOF: Independent  PATIENT GOALS: Decrease back pain, improve walking tolerance  OBJECTIVE:   DIAGNOSTIC FINDINGS:  MRI 10/10: T12-L1: No significant disc bulge. No spinal canal stenosis or neural foraminal narrowing.   L1-L2: No significant disc bulge. Mild facet arthropathy. No spinal canal stenosis or neural foraminal narrowing.   L2-L3: No significant disc bulge. Mild facet arthropathy. No spinal canal stenosis or neural foraminal narrowing.   L3-L4: Minimal disc bulge. Mild facet arthropathy.  No spinal canal stenosis or neural foraminal narrowing.   L4-L5: Grade 1 anterolisthesis with disc unroofing and moderate disc bulge. Moderate to severe facet arthropathy. Ligamentum flavum hypertrophy. Moderate to severe spinal canal stenosis. Effacement of the lateral recesses. Mild left neural foraminal narrowing.   L5-S1: Disc height loss and moderate disc bulge with superimposed right subarticular protrusion, which effaces the right lateral recess and contacts the descending right S1 nerve roots. Mild facet arthropathy. Narrowing of the left lateral recess. No spinal canal stenosis or neural foraminal narrowing.   IMPRESSION: 1. L4-L5 moderate to severe spinal canal stenosis and mild left neural foraminal narrowing. Effacement of the lateral recesses at this level likely compresses the descending L5 nerve roots. 2. L5-S1 right subarticular disc protrusion effaces the right lateral recess and contacts the descending right S1 nerve roots. Narrowing of the left lateral recess at this level likely affects the descending left S1 nerve roots.    PATIENT SURVEYS:  FOTO 47 -  FOTO-45- 1/16  SENSATION: N/T bil feet due to DM.   MUSCLE LENGTH: Bilateral hamstring tightness limiting passive straight leg raise to approximately 50 degrees bilaterally  POSTURE: flexed at hips with incr lumbar lordosis  PALPATION: Denied tenderness to palpation in bilateral SIJ  LUMBAR ROM:   AROM eval  Flexion Proximal 1/3 shin  Extension   Right lateral flexion   Left lateral flexion   Right rotation   Left rotation    (Blank rows = not tested)  LOWER EXTREMITY MMT:    MMT Right eval Left eval  Hip flexion    Hip extension    Hip abduction 4+/5 4+/5  Hip adduction    Hip internal rotation    Hip external rotation    Knee flexion    Knee extension    Ankle dorsiflexion    Ankle plantarflexion    Ankle inversion    Ankle eversion     (Blank rows = not tested)  GAIT: Distance  walked: walking to clinic Level of assistance: Complete Independence Comments: flexed posture, short step length with flat foot gait and slow cadence.   TODAY'S TREATMENT:         Treatment                           01/19/22  -Piriformis stretch supine-bilaterally 30sec/3ea -  PPT-5sec x10 - hooklying clam with RTB- x20 -hooklying march with TrA-x20 -sit to stands high/low table-2x10 -  standing hip abduction/extension 1.5# 3x10 bil/ea          Treatment                           01/12/22   Pt seen for aquatic therapy today.  Treatment took place in water 3.25-4.5 ft in depth at the [facility]. Temp of water was 91.  Pt entered/exited the pool via stairs with hand rail.  * no UE support:  walking forward/ backward; marching in place (cues for upright posture)  * side stepping with shoulder abdct/ add; progressed to using rainbow hand floats * rainbow hand floats for support: LE circumduction backwards / forwards 2 laps;  heel raises x 10; 1/2 diamonds x 10 each * STS on bench with feet on blue step with forward arm reach and cues for hip hinge, core engaged x 10 *hip hinge x 10 ue support on yellow noodle   * TrA set with solid blue noodle press to thighs 2x 5-6 *KB row with staggered LE then wide stance x 10-12 2 sets in each position     Pt requires the buoyancy and hydrostatic pressure of water for support, and to offload joints by unweighting joint load by at least 50 % in navel deep water and by at least 75-80% in chest to neck deep water.  Viscosity of the water is needed for resistance of strengthening. Water current perturbations provides challenge to standing balance requiring increased core activation.  PATIENT EDUCATION:  Education details: Geophysicist/field seismologist of condition, POC, HEP, exercise form/rationale Person educated: Patient Education method: Explanation, Demonstration, Tactile cues, Verbal cues, and Handouts Education comprehension: verbalized understanding,  returned demonstration, verbal cues required, tactile cues required, and needs further education  HOME EXERCISE PROGRAM: [email or web address]   ASSESSMENT:  CLINICAL IMPRESSION: Pt able to complete core/lumbar stabilization activities without complaint of low back pain or discomfort. Able to achieve correct PPT with minimal cuing. Felt mild muscular fatigue with hip strengthening tasks, but denied and LBP. Provided updated HEP to progress hip strengthening at home. Minimal change in FOTO score since IE. She will continue to benefit from skilled PT progressing towards all goals.     Initial Assessment: Patient is a 72 y.o. F who was seen today for physical therapy evaluation and treatment for chronic low back pain and bilateral hip pain.  Hip abduction strength is not concerning at this time but will benefit from strengthening and glutes to improve upright posture without increasing lumbar extension.  Anterior listhesis noted in MRI report and will continue to remain aware of any neural symptoms running distally.  OBJECTIVE IMPAIRMENTS: Abnormal gait, decreased activity tolerance, decreased balance, difficulty walking, decreased ROM, decreased strength, increased muscle spasms, impaired flexibility, improper body mechanics, postural dysfunction, and pain.   ACTIVITY LIMITATIONS: carrying, lifting, bending, standing, squatting, stairs, transfers, bed mobility, reach over head, and locomotion level  PARTICIPATION LIMITATIONS: meal prep, cleaning, laundry, and community activity  PERSONAL FACTORS: Fitness and Time since onset of injury/illness/exacerbation are also affecting patient's functional outcome.   REHAB POTENTIAL: Good  CLINICAL DECISION MAKING: Stable/uncomplicated  EVALUATION COMPLEXITY: Low   GOALS: Goals reviewed with patient? Yes  SHORT TERM GOALS: Target date: 12/24/2021   Patient will verbalize performance of daily stretches upon return from traveling around the  holiday Baseline: Not currently performing any daily exercise routine Goal status: met 01/12/22   LONG TERM GOALS: Target date: POC date  Patient will be able to reach overhead  without increased back pain Baseline:  Goal status: INITIAL  2.  Patient will wake in the morning with minimal discomfort from back Baseline:  Goal status: INITIAL  3.  Patient will be able to walk through grocery store without limitation by back pain Baseline:  Goal status: INITIAL  4.  Patient will meet stated Foto goal Baseline:  Goal status: INITIAL  5.  Patient will demonstrate stacked posture in an upright stance Baseline:  Goal status: INITIAL   PLAN:  PT FREQUENCY: 1-2x/week  PT DURATION: other: 13 weeks, extended due to travel around the holidays  PLANNED INTERVENTIONS: Therapeutic exercises, Therapeutic activity, Neuromuscular re-education, Balance training, Gait training, Patient/Family education, Self Care, Joint mobilization, Stair training, Aquatic Therapy, Dry Needling, Spinal mobilization, Cryotherapy, Moist heat, Taping, Traction, Manual therapy, and Re-evaluation.  PLAN FOR NEXT SESSION: Review daily stretches and continue aquatic therapy for core strengthening hip flexor lengthening glutes engagement  Riki Altes, PTA      Referring diagnosis? M54.50 Treatment diagnosis? (if different than referring diagnosis) M54.59 R29.3 R26.2 What was this (referring dx) caused by? []  Surgery []  Fall [x]  Ongoing issue []  Arthritis []  Other: ____________  Laterality: []  Rt []  Lt [x]  Both  Check all possible CPT codes:  *CHOOSE 10 OR LESS*    []  97110 (Therapeutic Exercise)  []  92507 (SLP Treatment)  []  97112 (Neuro Re-ed)   []  92526 (Swallowing Treatment)   []  97116 (Gait Training)   []  (Cognitive Training, 1st 15 minutes) []  97140 (Manual Therapy)   []  97130 (Cognitive Training, each add'l 15 minutes)  []  97164 (Re-evaluation)                              []  Other, List  CPT Code ____________  []  97530 (Therapeutic Activities)     []  97535 (Self Care)   [x]  All codes above (97110 - 97535)  [x]  97012 (Mechanical Traction)  []  97014 (E-stim Unattended)  []  97032 (E-stim manual)  []  97033 (Ionto)  []  97035 (Ultrasound) []  97750 (Physical Performance Training) [x]  (Aquatic Therapy) []  97016 (Vasopneumatic Device) []  (Paraffin) []  97034 (Contrast Bath) []  (Wound Care 1st 20 sq cm) []  97598 (Wound Care each add'l 20 sq cm) []  97760 (Orthotic Fabrication, Fitting, Training Initial) []  (Prosthetic Management and Training Initial) []  (Orthotic or Prosthetic Training/ Modification Subsequent)

## 2022-01-20 ENCOUNTER — Ambulatory Visit (INDEPENDENT_AMBULATORY_CARE_PROVIDER_SITE_OTHER): Payer: Medicare PPO | Admitting: Podiatry

## 2022-01-20 DIAGNOSIS — M2042 Other hammer toe(s) (acquired), left foot: Secondary | ICD-10-CM

## 2022-01-20 DIAGNOSIS — M2041 Other hammer toe(s) (acquired), right foot: Secondary | ICD-10-CM

## 2022-01-20 NOTE — Progress Notes (Signed)
  Subjective:  Patient ID: Amy Bartlett, female    DOB: 06-06-1950,  MRN: [id number]  Chief Complaint  Patient presents with   Amy Bartlett    Dr. Elisha Ponder referral    72 y.o. female presents with the above complaint. History confirmed with patient.  She was referred to me by Dr. Elisha Ponder for evaluation of her hammertoe deformities.  They have been gradually worsening for her.  She feels like the second toe on the right foot cramps and sometimes changes position and is getting worse.  She says she has neuropathy.  Objective:  Physical Exam: warm, good capillary refill, no trophic changes or ulcerative lesions, normal DP and PT pulses, normal sensory exam, and reducible hammertoe deformities bilaterally lesser toes, she has full smooth range of motion of the interphalangeal and metatarsophalangeal joints.  Thickening of the dorsal PIPJ skin, no callus formation or ulceration   Assessment:   1. Acquired hammertoes of both feet      Plan:  Patient was evaluated and treated and all questions answered.  We discussed etiology and treatment options of hammertoe deformities including surgical and nonsurgical treatment.  I recommended nonoperative treatment, her diabetes which is moderately controlled, and neuropathy does carry some risk with operative intervention.  We discussed wider shoes supportive shoe gear and a roomy toebox.  We discussed worsening pain or deformity that would indicate proceeding to surgical intervention.  Currently her deformities are not lifestyle limiting.  She will let me know if this develops  Return if symptoms worsen or fail to improve.

## 2022-01-20 NOTE — Patient Instructions (Signed)
? ?EXERCISES- ?RANGE OF MOTION (ROM) AND STRETCHING EXERCISES ?These exercises may help you when beginning to rehabilitate your injury. Your symptoms may resolve with or without further involvement from your physician, physical therapist or athletic trainer. While completing these exercises, remember:  ?Restoring tissue flexibility helps normal motion to return to the joints. This allows healthier, less painful movement and activity. ?An effective stretch should be held for at least 30 seconds. ?A stretch should never be painful. You should only feel a gentle lengthening or release in the stretched tissue. ? ?RANGE OF MOTION - Toe Extension, Flexion ?Sit with your right / left leg crossed over your opposite knee. ?Grasp your toes and gently pull them back toward the top of your foot. You should feel a stretch on the bottom of your toes and/or foot. ?Hold this stretch for 10 seconds. ?Now, gently pull your toes toward the bottom of your foot. You should feel a stretch on the top of your toes and or foot. ?Hold this stretch for 10 seconds. ?Repeat  times. Complete this stretch 3 times per day.  ? ?RANGE OF MOTION - Ankle Dorsiflexion, Active Assisted ?Remove shoes and sit on a chair that is preferably not on a carpeted surface. ?Place right / left foot under knee. Extend your opposite leg for support. ?Keeping your heel down, slide your right / left foot back toward the chair until you feel a stretch at your ankle or calf. If you do not feel a stretch, slide your bottom forward to the edge of the chair, while still keeping your heel down. ?Hold this stretch for 10 seconds. ?Repeat 3 times. Complete this stretch 2 times per day.  ? ?STRETCH  Gastroc, Standing ?Place hands on wall. ?Extend right / left leg, keeping the front knee somewhat bent. ?Slightly point your toes inward on your back foot. ?Keeping your right / left heel on the floor and your knee straight, shift your weight toward the wall, not allowing your  back to arch. ?You should feel a gentle stretch in the right / left calf. Hold this position for 10 seconds. ?Repeat 3 times. Complete this stretch 2 times per day. ? ?STRETCH  Soleus, Standing ?Place hands on wall. ?Extend right / left leg, keeping the other knee somewhat bent. ?Slightly point your toes inward on your back foot. ?Keep your right / left heel on the floor, bend your back knee, and slightly shift your weight over the back leg so that you feel a gentle stretch deep in your back calf. ?Hold this position for 10 seconds. ?Repeat 3 times. Complete this stretch 2 times per day. ? ?STRETCH  Gastrocsoleus, Standing  ?Note: This exercise can place a lot of stress on your foot and ankle. Please complete this exercise only if specifically instructed by your caregiver.  ?Place the ball of your right / left foot on a step, keeping your other foot firmly on the same step. ?Hold on to the wall or a rail for balance. ?Slowly lift your other foot, allowing your body weight to press your heel down over the edge of the step. ?You should feel a stretch in your right / left calf. ?Hold this position for 10 seconds. ?Repeat this exercise with a slight bend in your right / left knee. ?Repeat 3 times. Complete this stretch 2 times per day.  ? ?STRENGTHENING EXERCISES - Plantar Fasciitis (Heel Spur Syndrome)  ?These exercises may help you when beginning to rehabilitate your injury. They may resolve your symptoms   with or without further involvement from your physician, physical therapist or athletic trainer. While completing these exercises, remember:  ?Muscles can gain both the endurance and the strength needed for everyday activities through controlled exercises. ?Complete these exercises as instructed by your physician, physical therapist or athletic trainer. Progress the resistance and repetitions only as guided. ? ?STRENGTH - Towel Curls ?Sit in a chair positioned on a non-carpeted surface. ?Place your foot on a towel,  keeping your heel on the floor. ?Pull the towel toward your heel by only curling your toes. Keep your heel on the floor. ?Repeat 3 times. Complete this exercise 2 times per day. ? ?STRENGTH - Ankle Inversion ?Secure one end of a rubber exercise band/tubing to a fixed object (table, pole). Loop the other end around your foot just before your toes. ?Place your fists between your knees. This will focus your strengthening at your ankle. ?Slowly, pull your big toe up and in, making sure the band/tubing is positioned to resist the entire motion. ?Hold this position for 10 seconds. ?Have your muscles resist the band/tubing as it slowly pulls your foot back to the starting position. ?Repeat 3 times. Complete this exercises 2 times per day.  ?Document Released: 12/21/2004 Document Revised: 03/15/2011 Document Reviewed: 04/04/2008 ?[employer]? Patient Information ?2014 [employer] ? ?

## 2022-01-21 ENCOUNTER — Encounter (HOSPITAL_BASED_OUTPATIENT_CLINIC_OR_DEPARTMENT_OTHER): Payer: Self-pay | Admitting: Physical Therapy

## 2022-01-21 ENCOUNTER — Ambulatory Visit (HOSPITAL_BASED_OUTPATIENT_CLINIC_OR_DEPARTMENT_OTHER): Payer: Medicare PPO | Admitting: Physical Therapy

## 2022-01-21 DIAGNOSIS — M5459 Other low back pain: Secondary | ICD-10-CM

## 2022-01-21 DIAGNOSIS — M25552 Pain in left hip: Secondary | ICD-10-CM | POA: Diagnosis not present

## 2022-01-21 DIAGNOSIS — M25551 Pain in right hip: Secondary | ICD-10-CM | POA: Diagnosis not present

## 2022-01-21 DIAGNOSIS — R293 Abnormal posture: Secondary | ICD-10-CM | POA: Diagnosis not present

## 2022-01-21 DIAGNOSIS — R262 Difficulty in walking, not elsewhere classified: Secondary | ICD-10-CM | POA: Diagnosis not present

## 2022-01-21 NOTE — Therapy (Signed)
OUTPATIENT PHYSICAL THERAPY THORACOLUMBAR TREATMENT   Patient Name: Amy Bartlett MRN: [id number] DOB:07/16/1950, 72 y.o., female Today's Date: 01/21/2022  END OF SESSION:  PT End of Session - 01/21/22 1208     Visit Number 7    Number of Visits 20    Date for PT Re-Evaluation 02/18/22    Authorization Type [employer]    Progress Note Due on Visit 10    PT Start Time 1204    PT Stop Time 8841    PT Time Calculation (min) 38 min    Activity Tolerance Patient tolerated treatment well    Behavior During Therapy WFL for tasks assessed/performed              Past Medical History:  Diagnosis Date   Arthritis    Chicken pox    Diabetes mellitus ([place])    Hyperlipidemia    Hypertension    Hyponatremia - HCTZ 07/15/2020   Osteopenia    Past Surgical History:  Procedure Laterality Date   [place] Bilateral 2009   TUBAL LIGATION     Patient Active Problem List   Diagnosis Date Noted   Peripheral vascular disease with claudication ([place]) 01/06/2022   Spinal stenosis of lumbar region with neurogenic claudication 01/06/2022   Class 2 severe obesity due to excess calories with serious comorbidity and body mass index (BMI) of 35.0 to 35.9 in adult [facility]) 03/10/2021   Diabetic peripheral neuropathy associated with type 2 diabetes mellitus ([place]) 12/18/2020   Hyponatremia - HCTZ 07/15/2020   Spondylosis of lumbar region without myelopathy or radiculopathy 07/14/2020   Cardiac pacemaker in situ 03/19/2019   CKD (chronic kidney disease) stage 3, GFR 30-59 ml/min ([facility]) 10/04/2016   Obesity (BMI 30-39.9) 01/12/2016   NSVT (nonsustained ventricular tachycardia) 12/05/2015   Primary localized osteoarthrosis of left lower leg 07/16/2014   History of complete heart block 10/04/2012   Combined hyperlipidemia associated with type 2 diabetes mellitus ([place]) 11/04/2011   Type 2 diabetes mellitus  with stage 3b chronic kidney disease, without long-term current use of insulin ([place]) 11/04/2011   Age-related osteoporosis without fracture 06/01/2011   Vitamin D deficiency 04/16/2011   Hypertension associated with diabetes ([place]) 01/11/2011    PCP: Billey Chang, MD  REFERRING PROVIDER:  Bebe Shaggy, MD    REFERRING DIAG: M54.50 (ICD-10-CM) - Low back pain, unspecified   Rationale for Evaluation and Treatment: Rehabilitation  THERAPY DIAG:  Other low back pain  Abnormal posture  Difficulty in walking, not elsewhere classified  ONSET DATE: Started in the 90s on and off and began again about 5 years ago  SUBJECTIVE:                                                                                                                                                                                          "  I felt really good after the last (land) session".   PERTINENT HISTORY:  T2DM, h/o c-section, OP, *pacemaker present*  PAIN:  Are you having pain? No  NPRS scale: 0/10 Pain location: Pain description:  Aggravating factors: standing, walking, morning stiffness Relieving factors: sit down  PRECAUTIONS: ICD/Pacemaker  WEIGHT BEARING RESTRICTIONS: No  FALLS:  Has patient fallen in last 6 months? No  OCCUPATION: not working  PLOF: Independent  PATIENT GOALS: Decrease back pain, improve walking tolerance  OBJECTIVE:   DIAGNOSTIC FINDINGS:  MRI 10/10: T12-L1: No significant disc bulge. No spinal canal stenosis or neural foraminal narrowing.   L1-L2: No significant disc bulge. Mild facet arthropathy. No spinal canal stenosis or neural foraminal narrowing.   L2-L3: No significant disc bulge. Mild facet arthropathy. No spinal canal stenosis or neural foraminal narrowing.   L3-L4: Minimal disc bulge. Mild facet arthropathy. No spinal canal stenosis or neural foraminal narrowing.   L4-L5: Grade 1 anterolisthesis with disc unroofing and moderate disc bulge.  Moderate to severe facet arthropathy. Ligamentum flavum hypertrophy. Moderate to severe spinal canal stenosis. Effacement of the lateral recesses. Mild left neural foraminal narrowing.   L5-S1: Disc height loss and moderate disc bulge with superimposed right subarticular protrusion, which effaces the right lateral recess and contacts the descending right S1 nerve roots. Mild facet arthropathy. Narrowing of the left lateral recess. No spinal canal stenosis or neural foraminal narrowing.   IMPRESSION: 1. L4-L5 moderate to severe spinal canal stenosis and mild left neural foraminal narrowing. Effacement of the lateral recesses at this level likely compresses the descending L5 nerve roots. 2. L5-S1 right subarticular disc protrusion effaces the right lateral recess and contacts the descending right S1 nerve roots. Narrowing of the left lateral recess at this level likely affects the descending left S1 nerve roots.    PATIENT SURVEYS:  FOTO 47 -  FOTO-45- 1/16  SENSATION: N/T bil feet due to DM.   MUSCLE LENGTH: Bilateral hamstring tightness limiting passive straight leg raise to approximately 50 degrees bilaterally  POSTURE: flexed at hips with incr lumbar lordosis  PALPATION: Denied tenderness to palpation in bilateral SIJ  LUMBAR ROM:   AROM eval  Flexion Proximal 1/3 shin  Extension   Right lateral flexion   Left lateral flexion   Right rotation   Left rotation    (Blank rows = not tested)  LOWER EXTREMITY MMT:    MMT Right eval Left eval  Hip flexion    Hip extension    Hip abduction 4+/5 4+/5  Hip adduction    Hip internal rotation    Hip external rotation    Knee flexion    Knee extension    Ankle dorsiflexion    Ankle plantarflexion    Ankle inversion    Ankle eversion     (Blank rows = not tested)  GAIT: Distance walked: walking to clinic Level of assistance: Complete Independence Comments: flexed posture, short step length with flat foot gait  and slow cadence.   TODAY'S TREATMENT:        Treatment                           01/21/22   Pt seen for aquatic therapy today.  Treatment took place in water 3.25-4.5 ft in depth at the [facility]. Temp of water was 91.  Pt entered/exited the pool via stairs with hand rail.  * no UE support:  walking forward/ backward; marching in place (  cues for upright posture)  * side stepping with shoulder abdct/ add; progressed to using rainbow hand floats * Hip hinge and arm reach with hands on rainbow hand floats, TrA set and cues for neutral head * UE support with rainbow floats:  Hip circles CW/CCW (LE straight) x 10 each * marching high knees forward/ backward * holding wall:  single leg clams x 10 x 2; standing quad stretch x 20s x 2 each LE;  * TrA set with solid blue noodle press to thighs x 10 (slow and controlled motion) * kick board row with staggered LE then wide stance x 10 each foot forward * STS on 3rd step with forward arm reach and cues for forward weight shift, core engaged x 5  Pt requires the buoyancy and hydrostatic pressure of water for support, and to offload joints by unweighting joint load by at least 50 % in navel deep water and by at least 75-80% in chest to neck deep water.  Viscosity of the water is needed for resistance of strengthening. Water current perturbations provides challenge to standing balance requiring increased core activation.   Treatment                           01/19/22  -Piriformis stretch supine-bilaterally 30sec/3ea -  PPT-5sec x10 - hooklying clam with RTB- x20 -hooklying march with TrA-x20 -sit to stands high/low table-2x10 -standing hip abduction/extension 1.5# 3x10 bil/ea          Treatment                           01/12/22 Pt seen for aquatic therapy today.  Treatment took place in water 3.25-4.5 ft in depth at the [facility] pool. Temp of water was 91.  Pt entered/exited the pool via stairs with hand rail.  * no UE  support:  walking forward/ backward; marching in place (cues for upright posture)  * side stepping with shoulder abdct/ add; progressed to using rainbow hand floats * rainbow hand floats for support: LE circumduction backwards / forwards 2 laps;  heel raises x 10; 1/2 diamonds x 10 each * STS on bench with feet on blue step with forward arm reach and cues for hip hinge, core engaged x 10 *hip hinge x 10 ue support on yellow noodle   * TrA set with solid blue noodle press to thighs 2x 5-6 *KickBoard row with staggered LE then wide stance x 10-12 2 sets in each position  PATIENT EDUCATION:  Education details: Anatomy of condition, POC, HEP, exercise form/rationale Person educated: Patient Education method: Explanation, Demonstration, Tactile cues, Verbal cues, and Handouts Education comprehension: verbalized understanding, returned demonstration, verbal cues required, tactile cues required, and needs further education  HOME EXERCISE PROGRAM: [id number]   ASSESSMENT:  CLINICAL IMPRESSION: Pt reported an ache in bilat lateral lower leg intermittently during session; most noticeable during quad stretch. Pt able to complete core/lumbar stabilization activities without complaint of low back pain or discomfort.Minor cueing required for more upright trunk posture (vs forward).  She will continue to benefit from skilled PT to improve functional mobility with less pain.   Making good progress towards all goals.     OBJECTIVE IMPAIRMENTS: Abnormal gait, decreased activity tolerance, decreased balance, difficulty walking, decreased ROM, decreased strength, increased muscle spasms, impaired flexibility, improper body mechanics, postural dysfunction, and pain.   ACTIVITY LIMITATIONS: carrying, lifting, bending, standing, squatting, stairs, transfers, bed  mobility, reach over head, and locomotion level  PARTICIPATION LIMITATIONS: meal prep, cleaning, laundry, and community activity  PERSONAL FACTORS:  Fitness and Time since onset of injury/illness/exacerbation are also affecting patient's functional outcome.   REHAB POTENTIAL: Good  CLINICAL DECISION MAKING: Stable/uncomplicated  EVALUATION COMPLEXITY: Low   GOALS: Goals reviewed with patient? Yes  SHORT TERM GOALS: Target date: 12/24/2021   Patient will verbalize performance of daily stretches upon return from traveling around the holiday Baseline: Not currently performing any daily exercise routine Goal status: met 01/12/22   LONG TERM GOALS: Target date: 02/18/2022  Patient will be able to reach overhead without increased back pain Baseline: Improved, but still painful - 01/21/22 Goal status: Ongoing   2.  Patient will wake in the morning with minimal discomfort from back Baseline: pt reports 50% improvement - 01/21/22 Goal status: Ongoing   3.  Patient will be able to walk through grocery store without limitation by back pain Baseline:  Goal status: Ongoing   4.  Patient will meet stated Foto goal Baseline:  Goal status: Ongoing 01/19/22  5.  Patient will demonstrate stacked posture in an upright stance Baseline: Improving, but continues to require cues in water- 01/21/22 Goal status: Ongoing    PLAN:  PT FREQUENCY: 1-2x/week  PT DURATION: other: 13 weeks, extended due to travel around the holidays  PLANNED INTERVENTIONS: Therapeutic exercises, Therapeutic activity, Neuromuscular re-education, Balance training, Gait training, Patient/Family education, Self Care, Joint mobilization, Stair training, Aquatic Therapy, Dry Needling, Spinal mobilization, Cryotherapy, Moist heat, Taping, Traction, Manual therapy, and Re-evaluation.  PLAN FOR NEXT SESSION: Review daily stretches and continue aquatic therapy for core strengthening hip flexor lengthening glutes engagement. Work on overhead reach for LTG#1  Mayer Camel, PTA 01/21/22 12:57 PM [facility] [address] [place], [place], [address] Phone: [phone]   Fax:  [phone]   Referring diagnosis? M54.50 Treatment diagnosis? (if different than referring diagnosis) M54.59 R29.3 R26.2 What was this (referring dx) caused by? []  Surgery []  Fall [x]  Ongoing issue []  Arthritis []  Other: ____________  Laterality: []  Rt []  Lt [x]  Both  Check all possible CPT codes:  *CHOOSE 10 OR LESS*    []  97110 (Therapeutic Exercise)  []  (SLP Treatment)  []  97112 (Neuro Re-ed)   []  92526 (Swallowing Treatment)   []  97116 (Gait Training)   []  (Cognitive Training, 1st 15 minutes) []  97140 (Manual Therapy)   []  97130 (Cognitive Training, each add'l 15 minutes)  []  97164 (Re-evaluation)                              []  Other, List CPT Code ____________  []  97530 (Therapeutic Activities)     []  97535 (Self Care)   [x]  All codes above (97110 - 97535)  [x]  97012 (Mechanical Traction)  []  97014 (E-stim Unattended)  []  97032 (E-stim manual)  []  97033 (Ionto)  []  97035 (Ultrasound) []  97750 (Physical Performance Training) [x]  (Aquatic Therapy) []  97016 (Vasopneumatic Device) []  (Paraffin) []  97034 (Contrast Bath) []  97597 (Wound Care 1st 20 sq cm) []  97598 (Wound Care each add'l 20 sq cm) []  97760 (Orthotic Fabrication, Fitting, Training Initial) []  (Prosthetic Management and Training Initial) []  (Orthotic or Prosthetic Training/ Modification Subsequent)

## 2022-01-25 NOTE — Therapy (Signed)
OUTPATIENT PHYSICAL THERAPY THORACOLUMBAR TREATMENT   Patient Name: Amy Bartlett MRN: [id number] DOB:[birth year], 72 y.o., female Today's Date: 01/26/2022  END OF SESSION:  PT End of Session - 01/26/22 1149     Visit Number 8    Number of Visits 20    Date for PT Re-Evaluation 02/18/22    Authorization Type [employer]    Progress Note Due on Visit 10    PT Start Time 1147    Activity Tolerance Patient tolerated treatment well    Behavior During Therapy [facility] for tasks assessed/performed               Past Medical History:  Diagnosis Date   Arthritis    Chicken pox    Diabetes mellitus (HCC)    Hyperlipidemia    Hypertension    Hyponatremia - HCTZ 07/15/2020   Osteopenia    Past Surgical History:  Procedure Laterality Date   CARDIAC PACEMAKER PLACEMENT     CESAREAN SECTION  1974, 1977, 1979   REDUCTION MAMMAPLASTY Bilateral 2009   TUBAL LIGATION     Patient Active Problem List   Diagnosis Date Noted   Peripheral vascular disease with claudication (HCC) 01/06/2022   Spinal stenosis of lumbar region with neurogenic claudication 01/06/2022   Class 2 severe obesity due to excess calories with serious comorbidity and body mass index (BMI) of 35.0 to 35.9 in adult [facility]) 03/10/2021   Diabetic peripheral neuropathy associated with type 2 diabetes mellitus (HCC) 12/18/2020   Hyponatremia - HCTZ 07/15/2020   Spondylosis of lumbar region without myelopathy or radiculopathy 07/14/2020   Cardiac pacemaker in situ 03/19/2019   CKD (chronic kidney disease) stage 3, GFR 30-59 ml/min (HCC) 10/04/2016   Obesity (BMI 30-39.9) 01/12/2016   NSVT (nonsustained ventricular tachycardia) 12/05/2015   Primary localized osteoarthrosis of left lower leg 07/16/2014   History of complete heart block 10/04/2012   Combined hyperlipidemia associated with type 2 diabetes mellitus (HCC) 11/04/2011   Type 2 diabetes mellitus with stage 3b chronic kidney disease, without long-term  current use of insulin (HCC) 11/04/2011   Age-related osteoporosis without fracture 06/01/2011   Vitamin D deficiency 04/16/2011   Hypertension associated with diabetes (HCC) 01/11/2011    PCP: Asencion Partridge, MD  REFERRING PROVIDER:  Windle Guard, MD    REFERRING DIAG: M54.50 (ICD-10-CM) - Low back pain, unspecified   Rationale for Evaluation and Treatment: Rehabilitation  THERAPY DIAG:  Other low back pain  Abnormal posture  Difficulty in walking, not elsewhere classified  ONSET DATE: Started in the 90s on and off and began again about 5 years ago  SUBJECTIVE:                                                                                                                                                                                          "  I had some pain after standing up and taking a shower this morning". "Maybe I need to take shorter showers".   PERTINENT HISTORY:  T2DM, h/o c-section, OP, *pacemaker present*  PAIN:  Are you having pain? No  NPRS scale: 0/10 Pain location: Pain description:  Aggravating factors: standing, walking, morning stiffness Relieving factors: sit down  PRECAUTIONS: ICD/Pacemaker  WEIGHT BEARING RESTRICTIONS: No  FALLS:  Has patient fallen in last 6 months? No  OCCUPATION: not working  PLOF: Independent  PATIENT GOALS: Decrease back pain, improve walking tolerance  OBJECTIVE:   DIAGNOSTIC FINDINGS:  MRI 10/10: T12-L1: No significant disc bulge. No spinal canal stenosis or neural foraminal narrowing.   L1-L2: No significant disc bulge. Mild facet arthropathy. No spinal canal stenosis or neural foraminal narrowing.   L2-L3: No significant disc bulge. Mild facet arthropathy. No spinal canal stenosis or neural foraminal narrowing.   L3-L4: Minimal disc bulge. Mild facet arthropathy. No spinal canal stenosis or neural foraminal narrowing.   L4-L5: Grade 1 anterolisthesis with disc unroofing and moderate disc bulge.  Moderate to severe facet arthropathy. Ligamentum flavum hypertrophy. Moderate to severe spinal canal stenosis. Effacement of the lateral recesses. Mild left neural foraminal narrowing.   L5-S1: Disc height loss and moderate disc bulge with superimposed right subarticular protrusion, which effaces the right lateral recess and contacts the descending right S1 nerve roots. Mild facet arthropathy. Narrowing of the left lateral recess. No spinal canal stenosis or neural foraminal narrowing.   IMPRESSION: 1. L4-L5 moderate to severe spinal canal stenosis and mild left neural foraminal narrowing. Effacement of the lateral recesses at this level likely compresses the descending L5 nerve roots. 2. L5-S1 right subarticular disc protrusion effaces the right lateral recess and contacts the descending right S1 nerve roots. Narrowing of the left lateral recess at this level likely affects the descending left S1 nerve roots.    PATIENT SURVEYS:  FOTO 47 -  FOTO-45- 1/16  SENSATION: N/T bil feet due to DM.   MUSCLE LENGTH: Bilateral hamstring tightness limiting passive straight leg raise to approximately 50 degrees bilaterally  POSTURE: flexed at hips with incr lumbar lordosis  PALPATION: Denied tenderness to palpation in bilateral SIJ  LUMBAR ROM:   AROM eval  Flexion Proximal 1/3 shin  Extension   Right lateral flexion   Left lateral flexion   Right rotation   Left rotation    (Blank rows = not tested)  LOWER EXTREMITY MMT:    MMT Right eval Left eval  Hip flexion    Hip extension    Hip abduction 4+/5 4+/5  Hip adduction    Hip internal rotation    Hip external rotation    Knee flexion    Knee extension    Ankle dorsiflexion    Ankle plantarflexion    Ankle inversion    Ankle eversion     (Blank rows = not tested)  GAIT: Distance walked: walking to clinic Level of assistance: Complete Independence Comments: flexed posture, short step length with flat foot gait  and slow cadence.   TODAY'S TREATMENT:        Treatment                           01/26/22  - Nustep lvl 5, 5 min - Piriformis stretch supine-bilaterally 30sec/3ea -  PPT-5sec x10 - Hooklying clam with RTB- x20 - Hooklying march with TrA-x20 - Supine SLR 10x, bilat  - Sit to stands high/low table-2x10 -  Standing hip abduction/extension 1.5# 3x10 bil/ea -  Standing toe to heel raises x20   Treatment                           01/21/22   Pt seen for aquatic therapy today.  Treatment took place in water 3.25-4.5 ft in depth at the [facility]. Temp of water was 91.  Pt entered/exited the pool via stairs with hand rail.  * no UE support:  walking forward/ backward; marching in place (cues for upright posture)  * side stepping with shoulder abdct/ add; progressed to using rainbow hand floats * Hip hinge and arm reach with hands on rainbow hand floats, TrA set and cues for neutral head * UE support with rainbow floats:  Hip circles CW/CCW (LE straight) x 10 each * marching high knees forward/ backward * holding wall:  single leg clams x 10 x 2; standing quad stretch x 20s x 2 each LE;  * TrA set with solid blue noodle press to thighs x 10 (slow and controlled motion) * kick board row with staggered LE then wide stance x 10 each foot forward * STS on 3rd step with forward arm reach and cues for forward weight shift, core engaged x 5  Pt requires the buoyancy and hydrostatic pressure of water for support, and to offload joints by unweighting joint load by at least 50 % in navel deep water and by at least 75-80% in chest to neck deep water.  Viscosity of the water is needed for resistance of strengthening. Water current perturbations provides challenge to standing balance requiring increased core activation.   Treatment                           01/19/22  -Piriformis stretch supine-bilaterally 30sec/3ea -  PPT-5sec x10 - hooklying clam with RTB- x20 -hooklying march with  TrA-x20 -sit to stands high/low table-2x10 -standing hip abduction/extension 1.5# 3x10 bil/ea          Treatment                           01/12/22 Pt seen for aquatic therapy today.  Treatment took place in water 3.25-4.5 ft in depth at the [facility]. Temp of water was 91.  Pt entered/exited the pool via stairs with hand rail.  * no UE support:  walking forward/ backward; marching in place (cues for upright posture)  * side stepping with shoulder abdct/ add; progressed to using rainbow hand floats * rainbow hand floats for support: LE circumduction backwards / forwards 2 laps;  heel raises x 10; 1/2 diamonds x 10 each * STS on bench with feet on blue step with forward arm reach and cues for hip hinge, core engaged x 10 *hip hinge x 10 ue support on yellow noodle   * TrA set with solid blue noodle press to thighs 2x 5-6 *KickBoard row with staggered LE then wide stance x 10-12 2 sets in each position  PATIENT EDUCATION:  Education details: Anatomy of condition, POC, HEP, exercise form/rationale Person educated: Patient Education method: Explanation, Demonstration, Tactile cues, Verbal cues, and Handouts Education comprehension: verbalized understanding, returned demonstration, verbal cues required, tactile cues required, and needs further education  HOME EXERCISE PROGRAM: [id number]   ASSESSMENT:  CLINICAL IMPRESSION: Pt reported an ache in her [facility] intermittently during session; most noticeable during  SLR's today. Pt able to complete core/lumbar stabilization activities without complaint of low back pain or discomfort.  She demonstrates good control with sit to stands today. Cues needed for increased range of motion with standing exercises with improvements noted. She will continue to benefit from skilled PT to improve functional mobility with less pain.   Making good progress towards all goals.   OBJECTIVE IMPAIRMENTS: Abnormal gait, decreased activity tolerance,  decreased balance, difficulty walking, decreased ROM, decreased strength, increased muscle spasms, impaired flexibility, improper body mechanics, postural dysfunction, and pain.   ACTIVITY LIMITATIONS: carrying, lifting, bending, standing, squatting, stairs, transfers, bed mobility, reach over head, and locomotion level  PARTICIPATION LIMITATIONS: meal prep, cleaning, laundry, and community activity  PERSONAL FACTORS: Fitness and Time since onset of injury/illness/exacerbation are also affecting patient's functional outcome.   REHAB POTENTIAL: Good  CLINICAL DECISION MAKING: Stable/uncomplicated  EVALUATION COMPLEXITY: Low   GOALS: Goals reviewed with patient? Yes  SHORT TERM GOALS: Target date: 12/24/2021   Patient will verbalize performance of daily stretches upon return from traveling around the holiday Baseline: Not currently performing any daily exercise routine Goal status: met 01/12/22   LONG TERM GOALS: Target date: 02/18/2022  Patient will be able to reach overhead without increased back pain Baseline: Improved, but still painful - 01/21/22 Goal status: Ongoing   2.  Patient will wake in the morning with minimal discomfort from back Baseline: pt reports 50% improvement - 01/21/22 Goal status: Ongoing   3.  Patient will be able to walk through grocery store without limitation by back pain Baseline:  Goal status: Ongoing   4.  Patient will meet stated Foto goal Baseline:  Goal status: Ongoing 01/19/22  5.  Patient will demonstrate stacked posture in an upright stance Baseline: Improving, but continues to require cues in water- 01/21/22 Goal status: Ongoing    PLAN:  PT FREQUENCY: 1-2x/week  PT DURATION: other: 13 weeks, extended due to travel around the holidays  PLANNED INTERVENTIONS: Therapeutic exercises, Therapeutic activity, Neuromuscular re-education, Balance training, Gait training, Patient/Family education, Self Care, Joint mobilization, Stair training,  Aquatic Therapy, Dry Needling, Spinal mobilization, Cryotherapy, Moist heat, Taping, Traction, Manual therapy, and Re-evaluation.  PLAN FOR NEXT SESSION: Review daily stretches and continue aquatic therapy for core strengthening hip flexor lengthening glutes engagement. Work on overhead reach for LTG#1  [employer] PT, DPT 01/26/22  11:49 AM    Referring diagnosis? M54.50 Treatment diagnosis? (if different than referring diagnosis) M54.59 R29.3 R26.2 What was this (referring dx) caused by? []  Surgery []  Fall [x]  Ongoing issue []  Arthritis []  Other: ____________  Laterality: []  Rt []  Lt [x]  Both  Check all possible CPT codes:  *CHOOSE 10 OR LESS*    []  97110 (Therapeutic Exercise)  []  92507 (SLP Treatment)  []  97112 (Neuro Re-ed)   []  92526 (Swallowing Treatment)   []  97116 (Gait Training)   []  [id number] (Cognitive Training, 1st 15 minutes) []  97140 (Manual Therapy)   []  97130 (Cognitive Training, each add'l 15 minutes)  []  97164 (Re-evaluation)                              []  Other, List CPT Code ____________  []  56213 (Therapeutic Activities)     []  08657 (Self Care)   [x]  All codes above (97110 - 97535)  [x]  97012 (Mechanical Traction)  []  97014 (E-stim Unattended)  []  97032 (E-stim manual)  []  97033 (Ionto)  []  97035 (Ultrasound) []  97750 (Physical  Performance Training) [x]  (Aquatic Therapy) []  [id number] (Vasopneumatic Device) []  (Paraffin) []  [id number] (Contrast Bath) []  97597 (Wound Care 1st 20 sq cm) []  97598 (Wound Care each add'l 20 sq cm) []  97760 (Orthotic Fabrication, Fitting, Training Initial) []  (Prosthetic Management and Training Initial) []  [id number] (Orthotic or Prosthetic Training/ Modification Subsequent)

## 2022-01-26 ENCOUNTER — Encounter (HOSPITAL_BASED_OUTPATIENT_CLINIC_OR_DEPARTMENT_OTHER): Payer: Self-pay | Admitting: Physical Therapy

## 2022-01-26 ENCOUNTER — Ambulatory Visit (HOSPITAL_BASED_OUTPATIENT_CLINIC_OR_DEPARTMENT_OTHER): Payer: Medicare PPO | Admitting: Physical Therapy

## 2022-01-26 DIAGNOSIS — R262 Difficulty in walking, not elsewhere classified: Secondary | ICD-10-CM

## 2022-01-26 DIAGNOSIS — M5459 Other low back pain: Secondary | ICD-10-CM | POA: Diagnosis not present

## 2022-01-26 DIAGNOSIS — M25552 Pain in left hip: Secondary | ICD-10-CM | POA: Diagnosis not present

## 2022-01-26 DIAGNOSIS — R293 Abnormal posture: Secondary | ICD-10-CM | POA: Diagnosis not present

## 2022-01-26 DIAGNOSIS — M25551 Pain in right hip: Secondary | ICD-10-CM | POA: Diagnosis not present

## 2022-01-28 ENCOUNTER — Ambulatory Visit (HOSPITAL_BASED_OUTPATIENT_CLINIC_OR_DEPARTMENT_OTHER): Payer: Medicare PPO | Admitting: Physical Therapy

## 2022-01-28 DIAGNOSIS — R262 Difficulty in walking, not elsewhere classified: Secondary | ICD-10-CM | POA: Diagnosis not present

## 2022-01-28 DIAGNOSIS — R293 Abnormal posture: Secondary | ICD-10-CM | POA: Diagnosis not present

## 2022-01-28 DIAGNOSIS — M25551 Pain in right hip: Secondary | ICD-10-CM | POA: Diagnosis not present

## 2022-01-28 DIAGNOSIS — M5459 Other low back pain: Secondary | ICD-10-CM

## 2022-01-28 DIAGNOSIS — M25552 Pain in left hip: Secondary | ICD-10-CM | POA: Diagnosis not present

## 2022-01-28 NOTE — Therapy (Signed)
OUTPATIENT PHYSICAL THERAPY THORACOLUMBAR TREATMENT   Patient Name: Amy Bartlett MRN: [id number] DOB:1950/04/30, 72 y.o., female Today's Date: 01/28/2022  END OF SESSION:  PT End of Session - 01/28/22 1207     Visit Number 9    Number of Visits 20    Date for PT Re-Evaluation 02/18/22    Authorization Type [employer]    Progress Note Due on Visit 10    PT Start Time 1202    PT Stop Time 1240    PT Time Calculation (min) 38 min    Behavior During Therapy [facility] for tasks assessed/performed               Past Medical History:  Diagnosis Date   Arthritis    Chicken pox    Diabetes mellitus (HCC)    Hyperlipidemia    Hypertension    Hyponatremia - HCTZ 07/15/2020   Osteopenia    Past Surgical History:  Procedure Laterality Date   CARDIAC PACEMAKER PLACEMENT     CESAREAN SECTION  1974, 1977, 1979   REDUCTION MAMMAPLASTY Bilateral 2009   TUBAL LIGATION     Patient Active Problem List   Diagnosis Date Noted   Peripheral vascular disease with claudication (HCC) 01/06/2022   Spinal stenosis of lumbar region with neurogenic claudication 01/06/2022   Class 2 severe obesity due to excess calories with serious comorbidity and body mass index (BMI) of 35.0 to 35.9 in adult [facility]) 03/10/2021   Diabetic peripheral neuropathy associated with type 2 diabetes mellitus (HCC) 12/18/2020   Hyponatremia - HCTZ 07/15/2020   Spondylosis of lumbar region without myelopathy or radiculopathy 07/14/2020   Cardiac pacemaker in situ 03/19/2019   CKD (chronic kidney disease) stage 3, GFR 30-59 ml/min (HCC) 10/04/2016   Obesity (BMI 30-39.9) 01/12/2016   NSVT (nonsustained ventricular tachycardia) 12/05/2015   Primary localized osteoarthrosis of left lower leg 07/16/2014   History of complete heart block 10/04/2012   Combined hyperlipidemia associated with type 2 diabetes mellitus (HCC) 11/04/2011   Type 2 diabetes mellitus with stage 3b chronic kidney disease, without  long-term current use of insulin (HCC) 11/04/2011   Age-related osteoporosis without fracture 06/01/2011   Vitamin D deficiency 04/16/2011   Hypertension associated with diabetes (HCC) 01/11/2011    PCP: Asencion Partridge, MD  REFERRING PROVIDER:  Windle Guard, MD    REFERRING DIAG: M54.50 (ICD-10-CM) - Low back pain, unspecified   Rationale for Evaluation and Treatment: Rehabilitation  THERAPY DIAG:  Other low back pain  Abnormal posture  Difficulty in walking, not elsewhere classified  Bilateral hip pain  ONSET DATE: Started in the 90s on and off and began again about 5 years ago  SUBJECTIVE:  Pt reports she can reach without pain in back now, "I just feel it in my legs".  She reports she was also pleasantly surprised how long she could stand before she needed to rest when out shopping for furniture.  Pt reports her LEs were sore after land session, she rested the next day for relief.   PERTINENT HISTORY:  T2DM, h/o c-section, OP, *pacemaker present*  PAIN:  Are you having pain? yes  NPRS scale: 3-4/10 Pain location: lower back bilat  Pain description: sore/ache Aggravating factors: standing, walking, morning stiffness Relieving factors: sit down  PRECAUTIONS: ICD/Pacemaker  WEIGHT BEARING RESTRICTIONS: No  FALLS:  Has patient fallen in last 6 months? No  OCCUPATION: not working  PLOF: Independent  PATIENT GOALS: Decrease back pain, improve walking tolerance  OBJECTIVE:   DIAGNOSTIC FINDINGS:  MRI 10/10: T12-L1: No significant disc bulge. No spinal canal stenosis or neural foraminal narrowing.   L1-L2: No significant disc bulge. Mild facet arthropathy. No spinal canal stenosis or neural foraminal narrowing.   L2-L3: No significant disc bulge. Mild facet arthropathy. No  spinal canal stenosis or neural foraminal narrowing.   L3-L4: Minimal disc bulge. Mild facet arthropathy. No spinal canal stenosis or neural foraminal narrowing.   L4-L5: Grade 1 anterolisthesis with disc unroofing and moderate disc bulge. Moderate to severe facet arthropathy. Ligamentum flavum hypertrophy. Moderate to severe spinal canal stenosis. Effacement of the lateral recesses. Mild left neural foraminal narrowing.   L5-S1: Disc height loss and moderate disc bulge with superimposed right subarticular protrusion, which effaces the right lateral recess and contacts the descending right S1 nerve roots. Mild facet arthropathy. Narrowing of the left lateral recess. No spinal canal stenosis or neural foraminal narrowing.   IMPRESSION: 1. L4-L5 moderate to severe spinal canal stenosis and mild left neural foraminal narrowing. Effacement of the lateral recesses at this level likely compresses the descending L5 nerve roots. 2. L5-S1 right subarticular disc protrusion effaces the right lateral recess and contacts the descending right S1 nerve roots. Narrowing of the left lateral recess at this level likely affects the descending left S1 nerve roots.    PATIENT SURVEYS:  FOTO 47 -  FOTO-45- 1/16  SENSATION: N/T bil feet due to DM.   MUSCLE LENGTH: Bilateral hamstring tightness limiting passive straight leg raise to approximately 50 degrees bilaterally  POSTURE: flexed at hips with incr lumbar lordosis  PALPATION: Denied tenderness to palpation in bilateral SIJ  LUMBAR ROM:   AROM eval  Flexion Proximal 1/3 shin  Extension   Right lateral flexion   Left lateral flexion   Right rotation   Left rotation    (Blank rows = not tested)  LOWER EXTREMITY MMT:    MMT Right eval Left eval  Hip flexion    Hip extension    Hip abduction 4+/5 4+/5  Hip adduction    Hip internal rotation    Hip external rotation    Knee flexion    Knee extension    Ankle dorsiflexion     Ankle plantarflexion    Ankle inversion    Ankle eversion     (Blank rows = not tested)  GAIT: Distance walked: walking to clinic Level of assistance: Complete Independence Comments: flexed posture, short step length with flat foot gait and slow cadence.   TODAY'S TREATMENT:        Treatment  01/28/22 Pt seen for aquatic therapy today.  Treatment took place in water 3.25-4.5 ft in depth at the [place]. Temp of water was 91.  Pt entered/exited the pool via stairs with hand rail.  * no UE support:  walking forward/ backward; marching in place (cues for upright posture)  * side stepping with shoulder abdct/ add using rainbow hand floats * TrA set with yellow floats tricep push down x 10 * Hip hinge and arm reach with hands on yellow hand floats, TrA set and cues for neutral head * Warrior 1 with noodle lift off of water towards ceiling x 5 each leg forward * holding noodle: 3 way leg kicks x 5 x 2 each LE (Rt SLS challenging) * return to walking backwards  * TrA set with solid blue noodle press to thighs x 8 (slow and controlled motion) * holding wall:  standing quad stretch x 20s x 2 each LE; single leg clams x 10  * STS on 3rd step with forward arm reach and cues for forward weight shift, core engaged x 2 (challenge) * bilat calf stretch x 20s (heels off of step)  Pt requires the buoyancy and hydrostatic pressure of water for support, and to offload joints by unweighting joint load by at least 50 % in navel deep water and by at least 75-80% in chest to neck deep water.  Viscosity of the water is needed for resistance of strengthening. Water current perturbations provides challenge to standing balance requiring increased core activation.   Treatment                           01/26/22  - Nustep lvl 5, 5 min - Piriformis stretch supine-bilaterally 30sec/3ea -  PPT-5sec x10 - Hooklying clam with RTB- x20 - Hooklying march with TrA-x20 -  Supine SLR 10x, bilat  - Sit to stands high/low table-2x10 - Standing hip abduction/extension 1.5# 3x10 bil/ea -  Standing toe to heel raises x20   Treatment                           01/21/22   Pt seen for aquatic therapy today.  Treatment took place in water 3.25-4.5 ft in depth at the [place]. Temp of water was 91.  Pt entered/exited the pool via stairs with hand rail.  * no UE support:  walking forward/ backward; marching in place (cues for upright posture)  * side stepping with shoulder abdct/ add; progressed to using rainbow hand floats * Hip hinge and arm reach with hands on rainbow hand floats, TrA set and cues for neutral head * UE support with rainbow floats:  Hip circles CW/CCW (LE straight) x 10 each * marching high knees forward/ backward * holding wall:  single leg clams x 10 x 2; standing quad stretch x 20s x 2 each LE;  * TrA set with solid blue noodle press to thighs x 10 (slow and controlled motion) * kick board row with staggered LE then wide stance x 10 each foot forward * STS on 3rd step with forward arm reach and cues for forward weight shift, core engaged x 5  Pt requires the buoyancy and hydrostatic pressure of water for support, and to offload joints by unweighting joint load by at least 50 % in navel deep water and by at least 75-80% in chest to neck deep water.  Viscosity of the water  is needed for resistance of strengthening. Water current perturbations provides challenge to standing balance requiring increased core activation.   Treatment                           01/19/22  -Piriformis stretch supine-bilaterally 30sec/3ea -  PPT-5sec x10 - hooklying clam with RTB- x20 -hooklying march with TrA-x20 -sit to stands high/low table-2x10 -standing hip abduction/extension 1.5# 3x10 bil/ea          Treatment                           01/12/22 Pt seen for aquatic therapy today.  Treatment took place in water 3.25-4.5 ft in depth at the [facility] pool. Temp of water was 91.  Pt entered/exited the pool via stairs with hand rail.  * no UE support:  walking forward/ backward; marching in place (cues for upright posture)  * side stepping with shoulder abdct/ add; progressed to using rainbow hand floats * rainbow hand floats for support: LE circumduction backwards / forwards 2 laps;  heel raises x 10; 1/2 diamonds x 10 each * STS on bench with feet on blue step with forward arm reach and cues for hip hinge, core engaged x 10 *hip hinge x 10 ue support on yellow noodle   * TrA set with solid blue noodle press to thighs 2x 5-6 *KickBoard row with staggered LE then wide stance x 10-12 2 sets in each position  PATIENT EDUCATION:  Education details: Anatomy of condition, POC, HEP, exercise form/rationale Person educated: Patient Education method: Explanation, Demonstration, Tactile cues, Verbal cues, and Handouts Education comprehension: verbalized understanding, returned demonstration, verbal cues required, tactile cues required, and needs further education  HOME EXERCISE PROGRAM: [id number]   ASSESSMENT:  CLINICAL IMPRESSION:  Pt reports elimination of her back pain in water.  She tolerated overhead reach with warrior 1 well; kept reps low to assess tolerance next visit.  She requires only minor cues for more upright trunk posture. She will continue to benefit from skilled PT to improve functional mobility with less pain.   Making good progress towards all goals; PT to assess goals at next land visit.   OBJECTIVE IMPAIRMENTS: Abnormal gait, decreased activity tolerance, decreased balance, difficulty walking, decreased ROM, decreased strength, increased muscle spasms, impaired flexibility, improper body mechanics, postural dysfunction, and pain.   ACTIVITY LIMITATIONS: carrying, lifting, bending, standing, squatting, stairs, transfers, bed mobility, reach over head, and locomotion level  PARTICIPATION LIMITATIONS: meal prep,  cleaning, laundry, and community activity  PERSONAL FACTORS: Fitness and Time since onset of injury/illness/exacerbation are also affecting patient's functional outcome.   REHAB POTENTIAL: Good  CLINICAL DECISION MAKING: Stable/uncomplicated  EVALUATION COMPLEXITY: Low   GOALS: Goals reviewed with patient? Yes  SHORT TERM GOALS: Target date: 12/24/2021   Patient will verbalize performance of daily stretches upon return from traveling around the holiday Baseline: Not currently performing any daily exercise routine Goal status: met 01/12/22   LONG TERM GOALS: Target date: 02/18/2022  Patient will be able to reach overhead without increased back pain Baseline: Improved, but still painful - 01/21/22 Goal status: Ongoing   2.  Patient will wake in the morning with minimal discomfort from back Baseline: pt reports 50% improvement - 01/21/22 Goal status: Ongoing   3.  Patient will be able to walk through grocery store without limitation by back pain Baseline:  Goal status: Ongoing   4.  Patient will meet stated Foto goal Baseline:  Goal status: Ongoing 01/19/22  5.  Patient will demonstrate stacked posture in an upright stance Baseline: Improving, but continues to require cues in water- 01/21/22 Goal status: Ongoing    PLAN:  PT FREQUENCY: 1-2x/week  PT DURATION: other: 13 weeks, extended due to travel around the holidays  PLANNED INTERVENTIONS: Therapeutic exercises, Therapeutic activity, Neuromuscular re-education, Balance training, Gait training, Patient/Family education, Self Care, Joint mobilization, Stair training, Aquatic Therapy, Dry Needling, Spinal mobilization, Cryotherapy, Moist heat, Taping, Traction, Manual therapy, and Re-evaluation.  PLAN FOR NEXT SESSION: continue land and aquatic therapy for core strengthening, hip flexor lengthening,  glutes engagement. Work on overhead reach for LTG#1   Mayer Camel, PTA 01/28/22 12:54 PM [facility]  [facility] [address] [place], [place], [address] Phone: [phone]   Fax:  [phone]    Referring diagnosis? M54.50 Treatment diagnosis? (if different than referring diagnosis) M54.59 R29.3 R26.2 What was this (referring dx) caused by? []  Surgery []  Fall [x]  Ongoing issue []  Arthritis []  Other: ____________  Laterality: []  Rt []  Lt [x]  Both  Check all possible CPT codes:  *CHOOSE 10 OR LESS*    []  97110 (Therapeutic Exercise)  []  92507 (SLP Treatment)  []  97112 (Neuro Re-ed)   []  92526 (Swallowing Treatment)   []  97116 (Gait Training)   []  (Cognitive Training, 1st 15 minutes) []  97140 (Manual Therapy)   []  97130 (Cognitive Training, each add'l 15 minutes)  []  97164 (Re-evaluation)                              []  Other, List CPT Code ____________  []  97530 (Therapeutic Activities)     []  97535 (Self Care)   [x]  All codes above (97110 - 97535)  [x]  97012 (Mechanical Traction)  []  97014 (E-stim Unattended)  []  97032 (E-stim manual)  []  97033 (Ionto)  []  97035 (Ultrasound) []  97750 (Physical Performance Training) [x]  (Aquatic Therapy) []  97016 (Vasopneumatic Device) []  (Paraffin) []  97034 (Contrast Bath) []  97597 (Wound Care 1st 20 sq cm) []  97598 (Wound Care each add'l 20 sq cm) []  97760 (Orthotic Fabrication, Fitting, Training Initial) []  (Prosthetic Management and Training Initial) []  (Orthotic or Prosthetic Training/ Modification Subsequent)

## 2022-02-02 ENCOUNTER — Encounter (HOSPITAL_BASED_OUTPATIENT_CLINIC_OR_DEPARTMENT_OTHER): Payer: Self-pay | Admitting: Physical Therapy

## 2022-02-02 ENCOUNTER — Encounter (HOSPITAL_BASED_OUTPATIENT_CLINIC_OR_DEPARTMENT_OTHER): Payer: Medicare PPO | Admitting: Physical Therapy

## 2022-02-02 ENCOUNTER — Ambulatory Visit (HOSPITAL_BASED_OUTPATIENT_CLINIC_OR_DEPARTMENT_OTHER): Payer: Medicare PPO | Admitting: Physical Therapy

## 2022-02-02 DIAGNOSIS — M25551 Pain in right hip: Secondary | ICD-10-CM

## 2022-02-02 DIAGNOSIS — R293 Abnormal posture: Secondary | ICD-10-CM | POA: Diagnosis not present

## 2022-02-02 DIAGNOSIS — R262 Difficulty in walking, not elsewhere classified: Secondary | ICD-10-CM

## 2022-02-02 DIAGNOSIS — M5459 Other low back pain: Secondary | ICD-10-CM

## 2022-02-02 DIAGNOSIS — M25552 Pain in left hip: Secondary | ICD-10-CM | POA: Diagnosis not present

## 2022-02-02 NOTE — Therapy (Signed)
OUTPATIENT PHYSICAL THERAPY THORACOLUMBAR TREATMENT   Patient Name: Amy Bartlett MRN: [id number] DOB:02/17/1950, 72 y.o., female Today's Date: 02/02/2022  END OF SESSION:  PT End of Session - 02/02/22 1144     Visit Number 10    Number of Visits 20    Date for PT Re-Evaluation 02/18/22    Authorization Type [employer]    Progress Note Due on Visit 45    PT Start Time 1145    PT Stop Time 1212    PT Time Calculation (min) 27 min    Activity Tolerance Patient tolerated treatment well    Behavior During Therapy WFL for tasks assessed/performed                Past Medical History:  Diagnosis Date   Arthritis    Chicken pox    Diabetes mellitus ([place])    Hyperlipidemia    Hypertension    Hyponatremia - HCTZ 07/15/2020   Osteopenia    Past Surgical History:  Procedure Laterality Date   [place] Bilateral 2009   TUBAL LIGATION     Patient Active Problem List   Diagnosis Date Noted   Peripheral vascular disease with claudication ([place]) 01/06/2022   Spinal stenosis of lumbar region with neurogenic claudication 01/06/2022   Class 2 severe obesity due to excess calories with serious comorbidity and body mass index (BMI) of 35.0 to 35.9 in adult [facility]) 03/10/2021   Diabetic peripheral neuropathy associated with type 2 diabetes mellitus ([place]) 12/18/2020   Hyponatremia - HCTZ 07/15/2020   Spondylosis of lumbar region without myelopathy or radiculopathy 07/14/2020   Cardiac pacemaker in situ 03/19/2019   CKD (chronic kidney disease) stage 3, GFR 30-59 ml/min ([facility]CC) 10/04/2016   Obesity (BMI 30-39.9) 01/12/2016   NSVT (nonsustained ventricular tachycardia) 12/05/2015   Primary localized osteoarthrosis of left lower leg 07/16/2014   History of complete heart block 10/04/2012   Combined hyperlipidemia associated with type 2 diabetes mellitus ([place]) 11/04/2011   Type 2 diabetes  mellitus with stage 3b chronic kidney disease, without long-term current use of insulin ([place]) 11/04/2011   Age-related osteoporosis without fracture 06/01/2011   Vitamin D deficiency 04/16/2011   Hypertension associated with diabetes ([place]) 01/11/2011    PCP: Billey Chang, MD  REFERRING PROVIDER:  Bebe Shaggy, MD    REFERRING DIAG: M54.50 (ICD-10-CM) - Low back pain, unspecified   Rationale for Evaluation and Treatment: Rehabilitation  THERAPY DIAG:  Other low back pain  Abnormal posture  Bilateral hip pain  Difficulty in walking, not elsewhere classified  ONSET DATE: Started in the 90s on and off and began again about 5 years ago  SUBJECTIVE:  Progress Note Reporting Period 11/19/21 to 02/02/2022  See note below for Objective Data and Assessment of Progress/Goals.     I was moving some furniture and hanging drapes so I am a little sore. Spread it out over the weekend. A while lot better than it was. Pointing across bil SIJ- that's where I feel the ache. It depends when it hurts but I don't knwo what it depends on.   PERTINENT HISTORY:  T2DM, h/o c-section, OP, *pacemaker present*  PAIN:  Are you having pain? yes  NPRS scale: smidge/10 Pain location: lower back across bil SIJ Pain description: sore/ache Aggravating factors: standing, walking, morning stiffness Relieving factors: sit down  PRECAUTIONS: ICD/Pacemaker  WEIGHT BEARING RESTRICTIONS: No  FALLS:  Has patient fallen in last 6 months? No  OCCUPATION: not working  PLOF: Independent  PATIENT GOALS: Decrease back pain, improve walking tolerance  OBJECTIVE:   DIAGNOSTIC FINDINGS:  MRI 10/10: T12-L1: No significant disc bulge. No spinal canal stenosis or neural foraminal narrowing.   L1-L2: No significant disc  bulge. Mild facet arthropathy. No spinal canal stenosis or neural foraminal narrowing.   L2-L3: No significant disc bulge. Mild facet arthropathy. No spinal canal stenosis or neural foraminal narrowing.   L3-L4: Minimal disc bulge. Mild facet arthropathy. No spinal canal stenosis or neural foraminal narrowing.   L4-L5: Grade 1 anterolisthesis with disc unroofing and moderate disc bulge. Moderate to severe facet arthropathy. Ligamentum flavum hypertrophy. Moderate to severe spinal canal stenosis. Effacement of the lateral recesses. Mild left neural foraminal narrowing.   L5-S1: Disc height loss and moderate disc bulge with superimposed right subarticular protrusion, which effaces the right lateral recess and contacts the descending right S1 nerve roots. Mild facet arthropathy. Narrowing of the left lateral recess. No spinal canal stenosis or neural foraminal narrowing.   IMPRESSION: 1. L4-L5 moderate to severe spinal canal stenosis and mild left neural foraminal narrowing. Effacement of the lateral recesses at this level likely compresses the descending L5 nerve roots. 2. L5-S1 right subarticular disc protrusion effaces the right lateral recess and contacts the descending right S1 nerve roots. Narrowing of the left lateral recess at this level likely affects the descending left S1 nerve roots.    PATIENT SURVEYS:  FOTO 47 -  FOTO-45- 1/16 02/02/22: 60  SENSATION: N/T bil feet due to DM.   MUSCLE LENGTH: Bilateral hamstring tightness limiting passive straight leg raise to approximately 50 degrees bilaterally  1/30: Rt to 80 deg, Lt to 90  POSTURE: flexed at hips with incr lumbar lordosis  PALPATION: Denied tenderness to palpation in bilateral SIJ  LUMBAR ROM:   AROM eval 1/30  Flexion Proximal 1/3 shin Able to touch toes`  Extension    Right lateral flexion    Left lateral flexion    Right rotation    Left rotation     (Blank rows = not tested)  LOWER EXTREMITY  MMT:    MMT Right eval Left eval Rt/Lt 1/30  Hip flexion     Hip extension     Hip abduction 4+/5 4+/5 5/5  Hip adduction     Hip internal rotation     Hip external rotation     Knee flexion     Knee extension     Ankle dorsiflexion     Ankle plantarflexion     Ankle inversion     Ankle eversion      (Blank rows = not tested)  GAIT: Distance walked: walking to clinic Level of assistance: Complete  Independence Comments: flexed posture, short step length with flat foot gait and slow cadence.   TODAY'S TREATMENT:        Treatment                            02/02/22:  See plan   Treatment                           01/28/22 Pt seen for aquatic therapy today.  Treatment took place in water 3.25-4.5 ft in depth at the [facility]. Temp of water was 91.  Pt entered/exited the pool via stairs with hand rail.  * no UE support:  walking forward/ backward; marching in place (cues for upright posture)  * side stepping with shoulder abdct/ add using rainbow hand floats * TrA set with yellow floats tricep push down x 10 * Hip hinge and arm reach with hands on yellow hand floats, TrA set and cues for neutral head * Warrior 1 with noodle lift off of water towards ceiling x 5 each leg forward * holding noodle: 3 way leg kicks x 5 x 2 each LE (Rt SLS challenging) * return to walking backwards  * TrA set with solid blue noodle press to thighs x 8 (slow and controlled motion) * holding wall:  standing quad stretch x 20s x 2 each LE; single leg clams x 10  * STS on 3rd step with forward arm reach and cues for forward weight shift, core engaged x 2 (challenge) * bilat calf stretch x 20s (heels off of step)  Pt requires the buoyancy and hydrostatic pressure of water for support, and to offload joints by unweighting joint load by at least 50 % in navel deep water and by at least 75-80% in chest to neck deep water.  Viscosity of the water is needed for resistance of strengthening.  Water current perturbations provides challenge to standing balance requiring increased core activation.   Treatment                           01/26/22  - Nustep lvl 5, 5 min - Piriformis stretch supine-bilaterally 30sec/3ea -  PPT-5sec x10 - Hooklying clam with RTB- x20 - Hooklying march with TrA-x20 - Supine SLR 10x, bilat  - Sit to stands high/low table-2x10 - Standing hip abduction/extension 1.5# 3x10 bil/ea -  Standing toe to heel raises x20   PATIENT EDUCATION:  Education details: Anatomy of condition, POC, HEP, exercise form/rationale Person educated: Patient Education method: Explanation, Demonstration, Tactile cues, Verbal cues, and Handouts Education comprehension: verbalized understanding, returned demonstration, verbal cues required, tactile cues required, and needs further education  HOME EXERCISE PROGRAM: [id number]   ASSESSMENT:  CLINICAL IMPRESSION: Patient has made significant improvement since beginning physical therapy.  Today we took measurements for discharge and discussed her home exercise program, importance of regular exercise, and times to incorporate stretches throughout her day as she is finding it hard to perform on a regular basis.  Plans to return to the [facility] for aquatic exercise and will complete 1 more appointment in the pool to finalize home exercise program for aquatics.  OBJECTIVE IMPAIRMENTS: Abnormal gait, decreased activity tolerance, decreased balance, difficulty walking, decreased ROM, decreased strength, increased muscle spasms, impaired flexibility, improper body mechanics, postural dysfunction, and pain.   ACTIVITY LIMITATIONS: carrying, lifting, bending, standing, squatting, stairs, transfers, bed mobility, reach  over head, and locomotion level  PARTICIPATION LIMITATIONS: meal prep, cleaning, laundry, and community activity  PERSONAL FACTORS: Fitness and Time since onset of injury/illness/exacerbation are also affecting patient's functional  outcome.   REHAB POTENTIAL: Good  CLINICAL DECISION MAKING: Stable/uncomplicated  EVALUATION COMPLEXITY: Low   GOALS: Goals reviewed with patient? Yes  SHORT TERM GOALS: Target date: 12/24/2021   Patient will verbalize performance of daily stretches upon return from traveling around the holiday Baseline: Not currently performing any daily exercise routine Goal status: met 01/12/22   LONG TERM GOALS: Target date: 02/18/2022  Patient will be able to reach overhead without increased back pain  Goal status: achieved   2.  Patient will wake in the morning with minimal discomfort from back Baseline:  Goal status: achieved   3.  Patient will be able to walk through grocery store without limitation by back pain Baseline:  Goal status: achieved   4.  Patient will meet stated Foto goal Baseline:  Goal status: achieved  5.  Patient will demonstrate stacked posture in an upright stance Baseline: significant improvement, will continue to be aware Goal status: partially mey    PLAN:  PT FREQUENCY: 1-2x/week  PT DURATION: other: 13 weeks, extended due to travel around the holidays  PLANNED INTERVENTIONS: Therapeutic exercises, Therapeutic activity, Neuromuscular re-education, Balance training, Gait training, Patient/Family education, Self Care, Joint mobilization, Stair training, Aquatic Therapy, Dry Needling, Spinal mobilization, Cryotherapy, Moist heat, Taping, Traction, Manual therapy, and Re-evaluation.  PLAN FOR NEXT SESSION: finalize HEP & d/c   Fiorela Pelzer C. Karlin Binion PT, DPT 02/02/22 12:20 PM  [facility] [address] [place], [place], [address] Phone: [phone]   Fax:  [phone]    Referring diagnosis? M54.50 Treatment diagnosis? (if different than referring diagnosis) M54.59 R29.3 R26.2 What was this (referring dx) caused by? []  Surgery []  Fall [x]  Ongoing issue []  Arthritis []  Other:  ____________  Laterality: []  Rt []  Lt [x]  Both  Check all possible CPT codes:  *CHOOSE 10 OR LESS*    []  97110 (Therapeutic Exercise)  []  92507 (SLP Treatment)  []  97112 (Neuro Re-ed)   []  92526 (Swallowing Treatment)   []  97116 (Gait Training)   []  [id number] (Cognitive Training, 1st 15 minutes) []  97140 (Manual Therapy)   []  97130 (Cognitive Training, each add'l 15 minutes)  []  97164 (Re-evaluation)                              []  Other, List CPT Code ____________  []  31517 (Therapeutic Activities)     []  97535 (Self Care)   [x]  All codes above (97110 - 97535)  [x]  97012 (Mechanical Traction)  []  97014 (E-stim Unattended)  []  97032 (E-stim manual)  []  97033 (Ionto)  []  97035 (Ultrasound) []  97750 (Physical Performance Training) [x]  [id number] (Aquatic Therapy) []  97016 (Vasopneumatic Device) []  [id number] (Paraffin) []  97034 (Contrast Bath) []  97597 (Wound Care 1st 20 sq cm) []  97598 (Wound Care each add'l 20 sq cm) []  97760 (Orthotic Fabrication, Fitting, Training Initial) []  [id number] (Prosthetic Management and Training Initial) []  [id number] (Orthotic or Prosthetic Training/ Modification Subsequent)

## 2022-02-03 ENCOUNTER — Other Ambulatory Visit: Payer: Self-pay | Admitting: Family Medicine

## 2022-02-03 DIAGNOSIS — Z1231 Encounter for screening mammogram for malignant neoplasm of breast: Secondary | ICD-10-CM

## 2022-02-04 ENCOUNTER — Ambulatory Visit (HOSPITAL_BASED_OUTPATIENT_CLINIC_OR_DEPARTMENT_OTHER): Payer: Medicare PPO | Attending: Anesthesiology | Admitting: Physical Therapy

## 2022-02-04 ENCOUNTER — Encounter (HOSPITAL_BASED_OUTPATIENT_CLINIC_OR_DEPARTMENT_OTHER): Payer: Self-pay | Admitting: Physical Therapy

## 2022-02-04 DIAGNOSIS — M25551 Pain in right hip: Secondary | ICD-10-CM | POA: Diagnosis not present

## 2022-02-04 DIAGNOSIS — M25552 Pain in left hip: Secondary | ICD-10-CM | POA: Insufficient documentation

## 2022-02-04 DIAGNOSIS — M5459 Other low back pain: Secondary | ICD-10-CM | POA: Insufficient documentation

## 2022-02-04 DIAGNOSIS — R293 Abnormal posture: Secondary | ICD-10-CM | POA: Diagnosis not present

## 2022-02-04 NOTE — Therapy (Signed)
OUTPATIENT PHYSICAL THERAPY THORACOLUMBAR TREATMENT   Patient Name: Amy Bartlett MRN: [id number] DOB:01-10-1950, 72 y.o., female Today's Date: 02/04/2022  END OF SESSION:  PT End of Session - 02/04/22 1215     Visit Number 11    Number of Visits 20    Date for PT Re-Evaluation 02/18/22    Authorization Type [employer]    Progress Note Due on Visit 20    PT Start Time 1201    PT Stop Time 1240    PT Time Calculation (min) 39 min    Activity Tolerance Patient tolerated treatment well    Behavior During Therapy WFL for tasks assessed/performed                Past Medical History:  Diagnosis Date   Arthritis    Chicken pox    Diabetes mellitus ([place])    Hyperlipidemia    Hypertension    Hyponatremia - HCTZ 07/15/2020   Osteopenia    Past Surgical History:  Procedure Laterality Date   [place] Bilateral 2009   TUBAL LIGATION     Patient Active Problem List   Diagnosis Date Noted   Peripheral vascular disease with claudication ([place]) 01/06/2022   Spinal stenosis of lumbar region with neurogenic claudication 01/06/2022   Class 2 severe obesity due to excess calories with serious comorbidity and body mass index (BMI) of 35.0 to 35.9 in adult [facility]) 03/10/2021   Diabetic peripheral neuropathy associated with type 2 diabetes mellitus ([place]) 12/18/2020   Hyponatremia - HCTZ 07/15/2020   Spondylosis of lumbar region without myelopathy or radiculopathy 07/14/2020   Cardiac pacemaker in situ 03/19/2019   CKD (chronic kidney disease) stage 3, GFR 30-59 ml/min (HCC) 10/04/2016   Obesity (BMI 30-39.9) 01/12/2016   NSVT (nonsustained ventricular tachycardia) 12/05/2015   Primary localized osteoarthrosis of left lower leg 07/16/2014   History of complete heart block 10/04/2012   Combined hyperlipidemia associated with type 2 diabetes mellitus ([place]) 11/04/2011   Type 2 diabetes  mellitus with stage 3b chronic kidney disease, without long-term current use of insulin ([place]) 11/04/2011   Age-related osteoporosis without fracture 06/01/2011   Vitamin D deficiency 04/16/2011   Hypertension associated with diabetes ([place]) 01/11/2011    PCP: Billey Chang, MD  REFERRING PROVIDER:  Bebe Shaggy, MD    REFERRING DIAG: M54.50 (ICD-10-CM) - Low back pain, unspecified   Rationale for Evaluation and Treatment: Rehabilitation  THERAPY DIAG:  Other low back pain  Abnormal posture  Bilateral hip pain  ONSET DATE: Started in the 90s on and off and began again about 5 years ago  SUBJECTIVE:  Pt reports she has been riding a stationary bike that her son got for her.  She states that she no longer has pain upon waking in the morning.     PERTINENT HISTORY:  T2DM, h/o c-section, OP, *pacemaker present*  PAIN:  Are you having pain? yes  NPRS scale: 2/10 Pain location: lateral LEs  Pain description: sore/ache Aggravating factors: ? Relieving factors: sit down  PRECAUTIONS: ICD/Pacemaker  WEIGHT BEARING RESTRICTIONS: No  FALLS:  Has patient fallen in last 6 months? No  OCCUPATION: not working  PLOF: Independent  PATIENT GOALS: Decrease back pain, improve walking tolerance  OBJECTIVE:   DIAGNOSTIC FINDINGS:  MRI 10/10: T12-L1: No significant disc bulge. No spinal canal stenosis or neural foraminal narrowing.   L1-L2: No significant disc bulge. Mild facet arthropathy. No spinal canal stenosis or neural foraminal narrowing.   L2-L3: No significant disc bulge. Mild facet arthropathy. No spinal canal stenosis or neural foraminal narrowing.   L3-L4: Minimal disc bulge. Mild facet arthropathy. No spinal canal stenosis or neural foraminal narrowing.   L4-L5: Grade 1  anterolisthesis with disc unroofing and moderate disc bulge. Moderate to severe facet arthropathy. Ligamentum flavum hypertrophy. Moderate to severe spinal canal stenosis. Effacement of the lateral recesses. Mild left neural foraminal narrowing.   L5-S1: Disc height loss and moderate disc bulge with superimposed right subarticular protrusion, which effaces the right lateral recess and contacts the descending right S1 nerve roots. Mild facet arthropathy. Narrowing of the left lateral recess. No spinal canal stenosis or neural foraminal narrowing.   IMPRESSION: 1. L4-L5 moderate to severe spinal canal stenosis and mild left neural foraminal narrowing. Effacement of the lateral recesses at this level likely compresses the descending L5 nerve roots. 2. L5-S1 right subarticular disc protrusion effaces the right lateral recess and contacts the descending right S1 nerve roots. Narrowing of the left lateral recess at this level likely affects the descending left S1 nerve roots.    PATIENT SURVEYS:  FOTO 47 -  FOTO-45- 1/16 02/02/22: 60  SENSATION: N/T bil feet due to DM.   MUSCLE LENGTH: Bilateral hamstring tightness limiting passive straight leg raise to approximately 50 degrees bilaterally  1/30: Rt to 80 deg, Lt to 90  POSTURE: flexed at hips with incr lumbar lordosis  PALPATION: Denied tenderness to palpation in bilateral SIJ  LUMBAR ROM:   AROM eval 1/30  Flexion Proximal 1/3 shin Able to touch toes`  Extension    Right lateral flexion    Left lateral flexion    Right rotation    Left rotation     (Blank rows = not tested)  LOWER EXTREMITY MMT:    MMT Right eval Left eval Rt/Lt 1/30  Hip flexion     Hip extension     Hip abduction 4+/5 4+/5 5/5  Hip adduction     Hip internal rotation     Hip external rotation     Knee flexion     Knee extension     Ankle dorsiflexion     Ankle plantarflexion     Ankle inversion     Ankle eversion      (Blank rows = not  tested)  GAIT: Distance walked: walking to clinic Level of assistance: Complete Independence Comments: flexed posture, short step length with flat foot gait and slow cadence.   TODAY'S TREATMENT:        Treatment  02/04/22 Pt seen for aquatic therapy today.  Treatment took place in water 3.25-4.5 ft in depth at the [facility] pool. Temp of water was 91.  Pt entered/exited the pool via stairs with hand rail.  * no UE support:  walking forward/ backward; marching forward/backward; light jogging forward/backward  * side stepping with shoulder abdct/ add without/ with rainbow hand floats * monster walk forward/ backward  * TrA set with yellow floats tricep push down x 22 * Hip hinge and arm reach with hands on yellow hand floats, TrA set and cues for neutral head - switched to squats with forward arm reach * Warrior 1 with noodle lift off of water towards ceiling x 8 each leg forward * holding noodle: 3 way leg kicks x 10 each LE; single leg clams x 10 each * return to walking backwards  * TrA set with solid blue noodle press to thighs x 10 (slow and controlled motion) * holding wall:  standing quad stretch (cramped) - tried foot on bench (cramped) - switched to foot on 2nd step (success) * R/L hamstring stretch with foot on 2nd step   Pt requires the buoyancy and hydrostatic pressure of water for support, and to offload joints by unweighting joint load by at least 50 % in navel deep water and by at least 75-80% in chest to neck deep water.  Viscosity of the water is needed for resistance of strengthening. Water current perturbations provides challenge to standing balance requiring increased core activation.  Treatment                            02/02/22:  See plan   Treatment                           01/28/22 Pt seen for aquatic therapy today.  Treatment took place in water 3.25-4.5 ft in depth at the [facility] pool. Temp of water was 91.  Pt  entered/exited the pool via stairs with hand rail.  * no UE support:  walking forward/ backward; marching in place (cues for upright posture)  * side stepping with shoulder abdct/ add using rainbow hand floats * TrA set with yellow floats tricep push down x 10 * Hip hinge and arm reach with hands on yellow hand floats, TrA set and cues for neutral head * Warrior 1 with noodle lift off of water towards ceiling x 5 each leg forward * holding noodle: 3 way leg kicks x 5 x 2 each LE (Rt SLS challenging) * return to walking backwards  * TrA set with solid blue noodle press to thighs x 8 (slow and controlled motion) * holding wall:  standing quad stretch x 20s x 2 each LE; single leg clams x 10  * STS on 3rd step with forward arm reach and cues for forward weight shift, core engaged x 2 (challenge) * bilat calf stretch x 20s (heels off of step)  Pt requires the buoyancy and hydrostatic pressure of water for support, and to offload joints by unweighting joint load by at least 50 % in navel deep water and by at least 75-80% in chest to neck deep water.  Viscosity of the water is needed for resistance of strengthening. Water current perturbations provides challenge to standing balance requiring increased core activation.   Treatment  01/26/22  - Nustep lvl 5, 5 min - Piriformis stretch supine-bilaterally 30sec/3ea -  PPT-5sec x10 - Hooklying clam with RTB- x20 - Hooklying march with TrA-x20 - Supine SLR 10x, bilat  - Sit to stands high/low table-2x10 - Standing hip abduction/extension 1.5# 3x10 bil/ea -  Standing toe to heel raises x20   PATIENT EDUCATION:  Education details: Anatomy of condition, POC, HEP, exercise form/rationale Person educated: Patient Education method: Explanation, Demonstration, Tactile cues, Verbal cues, and Handouts Education comprehension: verbalized understanding, returned demonstration, verbal cues required, tactile cues required, and needs  further education  HOME EXERCISE PROGRAM: [id number]  Aquatic:  [id number] ASSESSMENT:  CLINICAL IMPRESSION: Patient has made significant improvement since beginning physical therapy. Reviewed exercises in pool for selection for aquatic HEP.   Laminated HEP issued at end of session.  Pt reported reduction of pain during session.  Pt has met a majority of goals and is pleased with current level of function.  Per discussion with supervising PT, will d/c after today's session.     OBJECTIVE IMPAIRMENTS: Abnormal gait, decreased activity tolerance, decreased balance, difficulty walking, decreased ROM, decreased strength, increased muscle spasms, impaired flexibility, improper body mechanics, postural dysfunction, and pain.   ACTIVITY LIMITATIONS: carrying, lifting, bending, standing, squatting, stairs, transfers, bed mobility, reach over head, and locomotion level  PARTICIPATION LIMITATIONS: meal prep, cleaning, laundry, and community activity  PERSONAL FACTORS: Fitness and Time since onset of injury/illness/exacerbation are also affecting patient's functional outcome.   REHAB POTENTIAL: Good  CLINICAL DECISION MAKING: Stable/uncomplicated  EVALUATION COMPLEXITY: Low   GOALS: Goals reviewed with patient? Yes  SHORT TERM GOALS: Target date: 12/24/2021   Patient will verbalize performance of daily stretches upon return from traveling around the holiday Baseline: Not currently performing any daily exercise routine Goal status: met 01/12/22   LONG TERM GOALS: Target date: 02/18/2022  Patient will be able to reach overhead without increased back pain  Goal status: achieved   2.  Patient will wake in the morning with minimal discomfort from back Baseline:  Goal status: achieved   3.  Patient will be able to walk through grocery store without limitation by back pain Baseline:  Goal status: achieved   4.  Patient will meet stated Foto goal Baseline:  Goal status: achieved  5.   Patient will demonstrate stacked posture in an upright stance Baseline: significant improvement, will continue to be aware Goal status: partially met    PLAN:  PT FREQUENCY: 1-2x/week  PT DURATION: other: 13 weeks, extended due to travel around the holidays  PLANNED INTERVENTIONS: Therapeutic exercises, Therapeutic activity, Neuromuscular re-education, Balance training, Gait training, Patient/Family education, Self Care, Joint mobilization, Stair training, Aquatic Therapy, Dry Needling, Spinal mobilization, Cryotherapy, Moist heat, Taping, Traction, Manual therapy, and Re-evaluation.  PLAN FOR NEXT SESSION: d/c today.   Kerin Perna, PTA 02/04/22 1:40 PM [facility] [address] [place], [place], [address] Phone: [phone]   Fax:  [phone]        Referring diagnosis? M54.50 Treatment diagnosis? (if different than referring diagnosis) M54.59 R29.3 R26.2 What was this (referring dx) caused by? []  Surgery []  Fall [x]  Ongoing issue []  Arthritis []  Other: ____________  Laterality: []  Rt []  Lt [x]  Both  Check all possible CPT codes:  *CHOOSE 10 OR LESS*    []  97110 (Therapeutic Exercise)  []  92507 (SLP Treatment)  []  97112 (Neuro Re-ed)   []  92526 (Swallowing Treatment)   []  44010 (Gait Training)   []  [phone] (Cognitive Training, 1st  15 minutes) []  97140 (Manual Therapy)   []  97130 (Cognitive Training, each add'l 15 minutes)  []  97164 (Re-evaluation)                              []  Other, List CPT Code ____________  []  97530 (Therapeutic Activities)     []  97535 (Self Care)   [x]  All codes above (97110 - 97535)  [x]  97012 (Mechanical Traction)  []  97014 (E-stim Unattended)  []  97032 (E-stim manual)  []  97033 (Ionto)  []  97035 (Ultrasound) []  97750 (Physical Performance Training) [x]  [id number] (Aquatic Therapy) []  97016 (Vasopneumatic Device) []  [id number] (Paraffin) []  97034 (Contrast Bath) []  97597 (Wound  Care 1st 20 sq cm) []  97598 (Wound Care each add'l 20 sq cm) []  97760 (Orthotic Fabrication, Fitting, Training Initial) []  [id number] (Prosthetic Management and Training Initial) []  [id number] (Orthotic or Prosthetic Training/ Modification Subsequent)

## 2022-02-09 ENCOUNTER — Encounter (HOSPITAL_BASED_OUTPATIENT_CLINIC_OR_DEPARTMENT_OTHER): Payer: Medicare PPO | Admitting: Physical Therapy

## 2022-02-11 ENCOUNTER — Ambulatory Visit (HOSPITAL_BASED_OUTPATIENT_CLINIC_OR_DEPARTMENT_OTHER): Payer: Medicare PPO | Admitting: Physical Therapy

## 2022-02-16 ENCOUNTER — Encounter (HOSPITAL_BASED_OUTPATIENT_CLINIC_OR_DEPARTMENT_OTHER): Payer: Medicare PPO | Admitting: Physical Therapy

## 2022-02-18 ENCOUNTER — Ambulatory Visit (HOSPITAL_BASED_OUTPATIENT_CLINIC_OR_DEPARTMENT_OTHER): Payer: Medicare PPO | Admitting: Physical Therapy

## 2022-02-23 ENCOUNTER — Encounter (HOSPITAL_BASED_OUTPATIENT_CLINIC_OR_DEPARTMENT_OTHER): Payer: Medicare PPO | Admitting: Physical Therapy

## 2022-02-25 ENCOUNTER — Ambulatory Visit (HOSPITAL_BASED_OUTPATIENT_CLINIC_OR_DEPARTMENT_OTHER): Payer: Medicare PPO | Admitting: Physical Therapy

## 2022-03-04 ENCOUNTER — Ambulatory Visit (HOSPITAL_BASED_OUTPATIENT_CLINIC_OR_DEPARTMENT_OTHER): Payer: Medicare PPO | Admitting: Physical Therapy

## 2022-03-05 ENCOUNTER — Ambulatory Visit: Payer: Medicare PPO

## 2022-03-05 ENCOUNTER — Ambulatory Visit
Admission: RE | Admit: 2022-03-05 | Discharge: 2022-03-05 | Disposition: A | Discharge status: Home / Self Care | Payer: Medicare PPO | Source: Ambulatory Visit | Attending: Family Medicine | Admitting: Family Medicine

## 2022-03-05 DIAGNOSIS — Z1231 Encounter for screening mammogram for malignant neoplasm of breast: Secondary | ICD-10-CM | POA: Diagnosis not present

## 2022-03-10 DIAGNOSIS — H2513 Age-related nuclear cataract, bilateral: Secondary | ICD-10-CM | POA: Diagnosis not present

## 2022-03-10 DIAGNOSIS — H04123 Dry eye syndrome of bilateral lacrimal glands: Secondary | ICD-10-CM | POA: Diagnosis not present

## 2022-03-10 DIAGNOSIS — E119 Type 2 diabetes mellitus without complications: Secondary | ICD-10-CM | POA: Diagnosis not present

## 2022-03-10 LAB — HM DIABETES EYE EXAM

## 2022-03-12 ENCOUNTER — Other Ambulatory Visit: Payer: Self-pay | Admitting: Family Medicine

## 2022-03-23 ENCOUNTER — Other Ambulatory Visit: Payer: Self-pay | Admitting: Family Medicine

## 2022-03-23 ENCOUNTER — Other Ambulatory Visit: Payer: Self-pay

## 2022-03-23 NOTE — Telephone Encounter (Signed)
This is in Historical.   Please Advise  

## 2022-03-24 MED ORDER — DICLOFENAC SODIUM 1 % EX GEL: 4.0000 g | Freq: Four times a day (QID) | CUTANEOUS | 11 refills | Status: AC | PRN

## 2022-03-29 ENCOUNTER — Encounter: Payer: Self-pay | Admitting: Physician Assistant

## 2022-03-29 ENCOUNTER — Ambulatory Visit (INDEPENDENT_AMBULATORY_CARE_PROVIDER_SITE_OTHER): Payer: Medicare PPO | Admitting: Physician Assistant

## 2022-03-29 VITALS — BP 112/76 | HR 94 | Temp 97.8°F | Ht 62.0 in | Wt 197.4 lb

## 2022-03-29 DIAGNOSIS — R0982 Postnasal drip: Secondary | ICD-10-CM

## 2022-03-29 DIAGNOSIS — J309 Allergic rhinitis, unspecified: Secondary | ICD-10-CM | POA: Diagnosis not present

## 2022-03-29 MED ORDER — FLUTICASONE PROPIONATE 50 MCG/ACT NA SUSP: 2.0000 | Freq: Every day | NASAL | 0 refills | Status: DC

## 2022-03-29 MED ORDER — LORATADINE 10 MG PO TABS: 10.0000 mg | ORAL_TABLET | Freq: Every day | ORAL | 2 refills | Status: AC

## 2022-03-29 NOTE — Progress Notes (Signed)
Subjective:    Patient ID: Amy Bartlett, female    DOB: [birth year], 72 y.o.   MRN: [id number]  Chief Complaint  Patient presents with   Flu Symptoms    Pt states she has symptoms of flu but hasn't tested for Covid; pt has been vaccinated for flu and Covid, feeling fatigued, throat feeling raw and burning, pt had little cough; some chills, headache,     HPI Patient is in today for acute sick symptoms. Says she is feeling better today than she has been.   Symptom onset: 8 days ago - started with ST Pertinent positives: Raw feeling in throat at night, coughing at night, fatigue, headache  Pertinent negatives: chest pain, SOB, NVD, abd pain  Treatments tried: Prilosec, Mucinex, Ibuprofen, throat lozenges  Sick exposure: possibly church    Past Medical History:  Diagnosis Date   Arthritis    Chicken pox    Diabetes mellitus ([place])    Hyperlipidemia    Hypertension    Hyponatremia - HCTZ 07/15/2020   Osteopenia     Past Surgical History:  Procedure Laterality Date   CARDIAC PACEMAKER PLACEMENT     CESAREAN SECTION  1974, 1977, 1979   REDUCTION MAMMAPLASTY Bilateral 2009   TUBAL LIGATION      Family History  Problem Relation Age of Onset   Glaucoma Mother    Heart disease Mother        Had Pacemaker, Died on CHF    Hypertension Mother    Osteoporosis Mother    Pneumonia Father    Arthritis Father    Diabetes Brother    Stroke Brother    Kidney failure Brother    Healthy Daughter    Healthy Daughter     Social History   Tobacco Use   Smoking status: Never   Smokeless tobacco: Never  Vaping Use   Vaping Use: Never used  Substance Use Topics   Alcohol use: Never   Drug use: Never     No Known Allergies  Review of Systems NEGATIVE UNLESS OTHERWISE INDICATED IN HPI      Objective:     BP 112/76 (BP Location: Left Arm)   Pulse 94   Temp 97.8 F (36.6 C) (Temporal)   Ht 5\' 2"  (1.575 m)   Wt 197 lb 6.4 oz (89.5 kg)   SpO2 98%   BMI 36.10  kg/m   Wt Readings from Last 3 Encounters:  03/29/22 197 lb 6.4 oz (89.5 kg)  01/06/22 192 lb 3.2 oz (87.2 kg)  10/30/21 194 lb 6.4 oz (88.2 kg)    BP Readings from Last 3 Encounters:  03/29/22 112/76  01/06/22 108/60  10/30/21 112/64     Physical Exam Vitals and nursing note reviewed.  Constitutional:      General: She is not in acute distress.    Appearance: Normal appearance. She is not ill-appearing.  HENT:     Head: Normocephalic.     Right Ear: Tympanic membrane, ear canal and external ear normal.     Left Ear: Tympanic membrane, ear canal and external ear normal.     Nose: Congestion and rhinorrhea present.     Mouth/Throat:     Mouth: Mucous membranes are moist.     Pharynx: No oropharyngeal exudate or posterior oropharyngeal erythema.  Eyes:     Extraocular Movements: Extraocular movements intact.     Conjunctiva/sclera: Conjunctivae normal.     Pupils: Pupils are equal, round, and reactive to light.  Cardiovascular:  Rate and Rhythm: Normal rate and regular rhythm.     Pulses: Normal pulses.     Heart sounds: Normal heart sounds. No murmur heard. Pulmonary:     Effort: Pulmonary effort is normal. No respiratory distress.     Breath sounds: Normal breath sounds. No wheezing.  Musculoskeletal:     Cervical back: Normal range of motion.  Skin:    General: Skin is warm.  Neurological:     Mental Status: She is alert and oriented to person, place, and time.  Psychiatric:        Mood and Affect: Mood normal.        Behavior: Behavior normal.        Assessment & Plan:  Allergic rhinitis with postnasal drip  Other orders -     Fluticasone Propionate; Place 2 sprays into both nostrils daily.  Dispense: 16 g; Refill: 0 -     Loratadine; Take 1 tablet (10 mg total) by mouth daily.  Dispense: 30 tablet; Refill: 2    No evidence of bacterial infection on exam. Likely allergen vs viral etiology. Treat with Flonase and Loratadine as directed. Nasal saline.  Fluids. Throat lozenges. Should resolve with time. Pt to recheck prn.     Return if symptoms worsen or fail to improve.     Teri Diltz M Batya Citron, PA-C

## 2022-03-31 ENCOUNTER — Ambulatory Visit: Payer: Medicare PPO | Admitting: Podiatry

## 2022-04-06 DIAGNOSIS — Z950 Presence of cardiac pacemaker: Secondary | ICD-10-CM | POA: Diagnosis not present

## 2022-04-06 DIAGNOSIS — I442 Atrioventricular block, complete: Secondary | ICD-10-CM | POA: Diagnosis not present

## 2022-05-12 DIAGNOSIS — M48062 Spinal stenosis, lumbar region with neurogenic claudication: Secondary | ICD-10-CM | POA: Diagnosis not present

## 2022-05-12 DIAGNOSIS — M792 Neuralgia and neuritis, unspecified: Secondary | ICD-10-CM | POA: Diagnosis not present

## 2022-05-12 DIAGNOSIS — M5451 Vertebrogenic low back pain: Secondary | ICD-10-CM | POA: Diagnosis not present

## 2022-06-17 ENCOUNTER — Other Ambulatory Visit: Payer: Self-pay | Admitting: Family Medicine

## 2022-07-02 IMAGING — DX DG LUMBAR SPINE COMPLETE 4+V
5 series · 5 of 5 positions shown · non-contrast
Comparison: None.

CLINICAL DATA: Back and leg pain.

EXAM:
LUMBAR SPINE - COMPLETE 4+ VIEW

[lumbar spine ap]
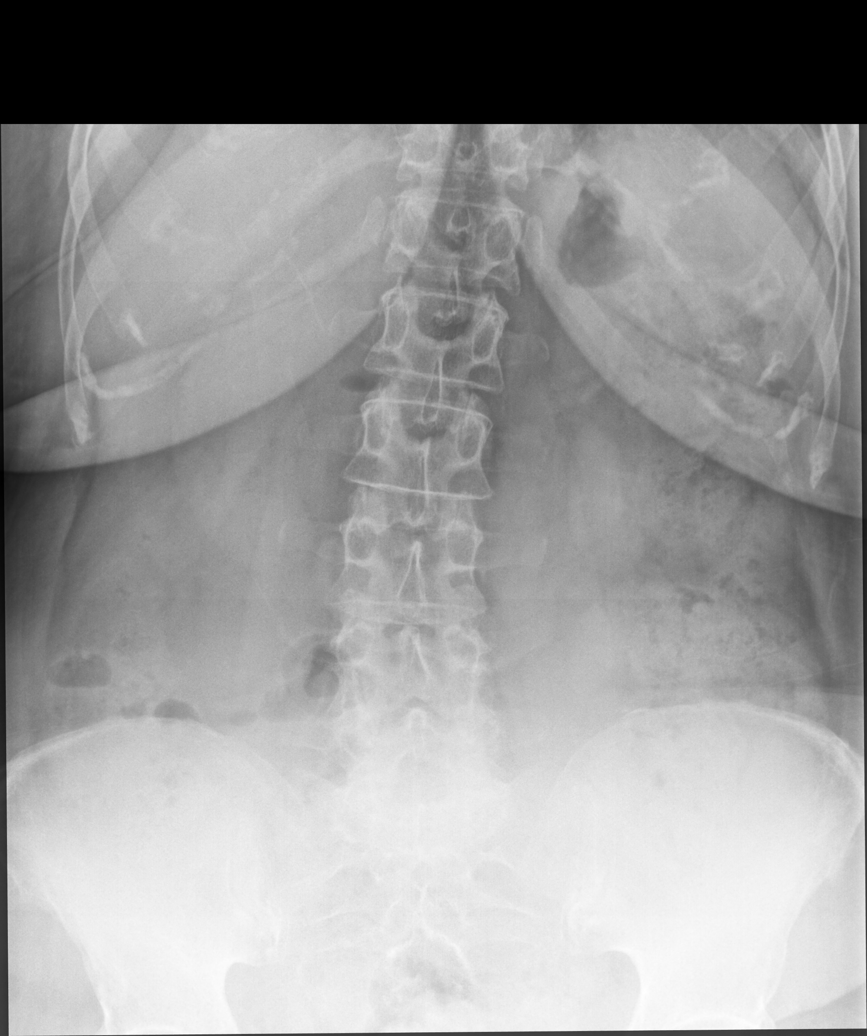

[lumbar spine oblique (1 of 2)]
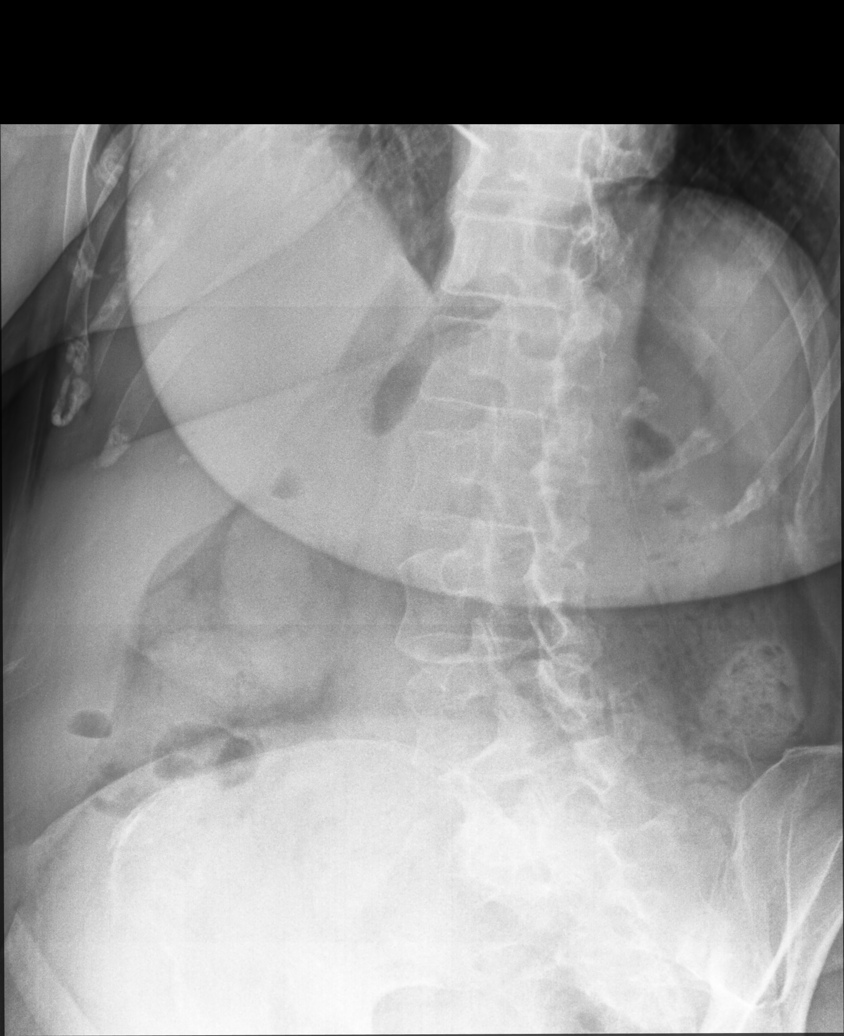

[lumbar spine oblique (2 of 2)]
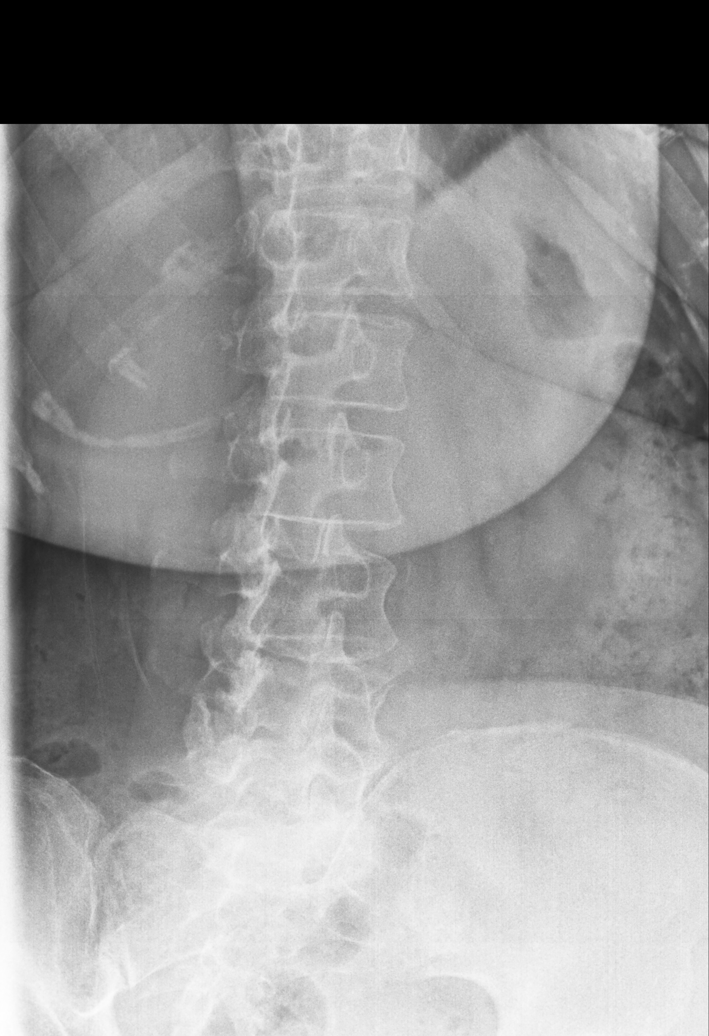

[lumbar spine lat (1 of 2)]
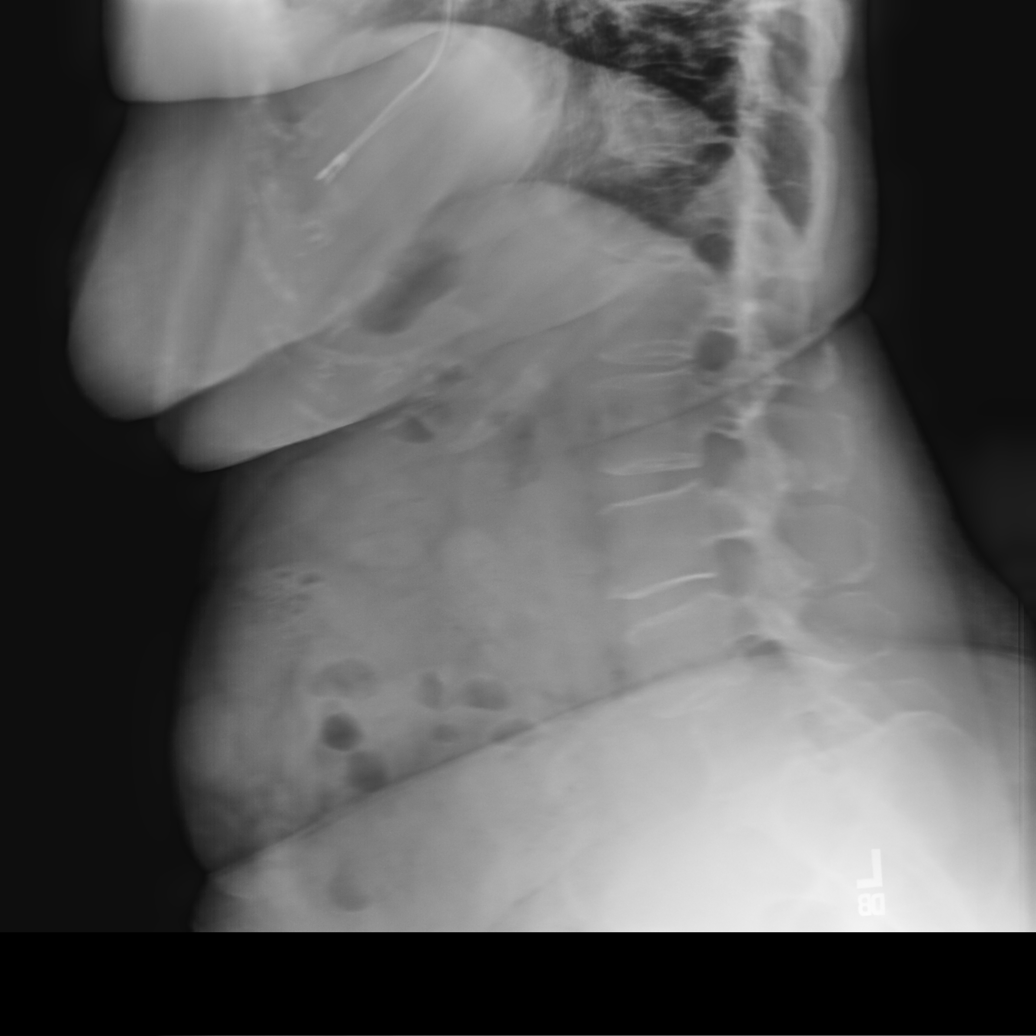

[lumbar spine lat (2 of 2)]
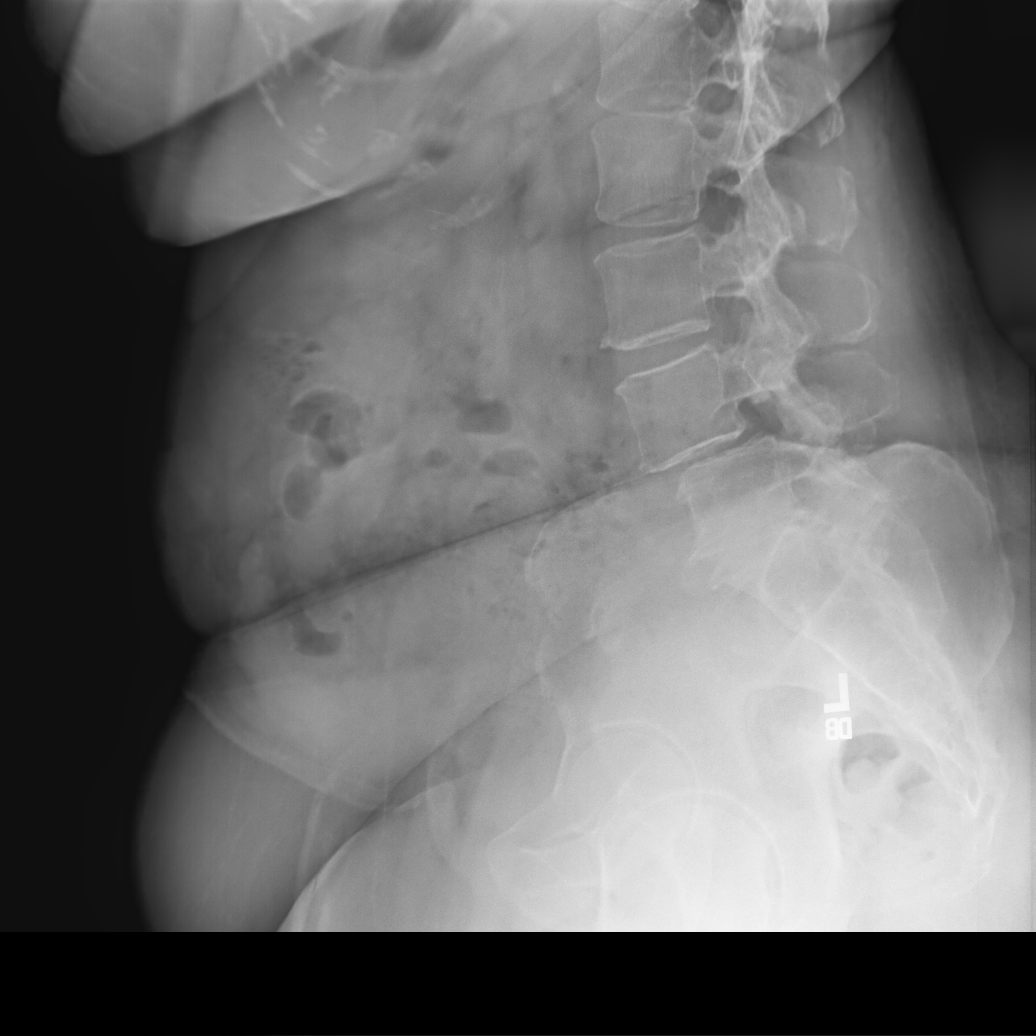

[5 of 5 positions shown; findings below may reference images not displayed]

FINDINGS: Grade 1 anterolisthesis of L4 versus L5. No other malalignment. No
fractures. Multilevel degenerative disc disease with small anterior
osteophytes. Lower lumbar facet degenerative changes.
IMPRESSION: Degenerative disc disease and lower lumbar facet degenerative
changes.

Grade 1 anterolisthesis of L4 versus L5.

## 2022-07-05 ENCOUNTER — Ambulatory Visit (HOSPITAL_BASED_OUTPATIENT_CLINIC_OR_DEPARTMENT_OTHER): Payer: Medicare PPO | Attending: Anesthesiology | Admitting: Physical Therapy

## 2022-07-05 ENCOUNTER — Other Ambulatory Visit: Payer: Self-pay

## 2022-07-05 DIAGNOSIS — M25551 Pain in right hip: Secondary | ICD-10-CM | POA: Insufficient documentation

## 2022-07-05 DIAGNOSIS — M545 Low back pain, unspecified: Secondary | ICD-10-CM | POA: Insufficient documentation

## 2022-07-05 DIAGNOSIS — R262 Difficulty in walking, not elsewhere classified: Secondary | ICD-10-CM | POA: Diagnosis not present

## 2022-07-05 NOTE — Therapy (Addendum)
OUTPATIENT PHYSICAL THERAPY THORACOLUMBAR EVALUATION   Patient Name: Amy Bartlett MRN: [id number] DOB:09-03-1950, 72 y.o., female Today's Date: 07/05/2022  END OF SESSION:  PT End of Session - 07/05/22 1239     Visit Number 1    Number of Visits 13    Date for PT Re-Evaluation 11/02/22    Authorization Type [employer] + [employer]    Progress Note Due on Visit 20    PT Start Time 1100    PT Stop Time 1140    PT Time Calculation (min) 40 min    Activity Tolerance Patient tolerated treatment well;Patient limited by pain    Behavior During Therapy [facility] for tasks assessed/performed             Past Medical History:  Diagnosis Date   Arthritis    Chicken pox    Diabetes mellitus (HCC)    Hyperlipidemia    Hypertension    Hyponatremia - HCTZ 07/15/2020   Osteopenia    Past Surgical History:  Procedure Laterality Date   CARDIAC PACEMAKER PLACEMENT     CESAREAN SECTION  1974, 1977, 1979   REDUCTION MAMMAPLASTY Bilateral 2009   TUBAL LIGATION     Patient Active Problem List   Diagnosis Date Noted   Peripheral vascular disease with claudication (HCC) 01/06/2022   Spinal stenosis of lumbar region with neurogenic claudication 01/06/2022   Class 2 severe obesity due to excess calories with serious comorbidity and body mass index (BMI) of 35.0 to 35.9 in adult [facility]) 03/10/2021   Diabetic peripheral neuropathy associated with type 2 diabetes mellitus (HCC) 12/18/2020   Hyponatremia - HCTZ 07/15/2020   Spondylosis of lumbar region without myelopathy or radiculopathy 07/14/2020   Cardiac pacemaker in situ 03/19/2019   CKD (chronic kidney disease) stage 3, GFR 30-59 ml/min (HCC) 10/04/2016   Obesity (BMI 30-39.9) 01/12/2016   NSVT (nonsustained ventricular tachycardia) 12/05/2015   Primary localized osteoarthrosis of left lower leg 07/16/2014   History of complete heart block 10/04/2012   Combined hyperlipidemia associated with type 2 diabetes mellitus (HCC) 11/04/2011    Type 2 diabetes mellitus with stage 3b chronic kidney disease, without long-term current use of insulin (HCC) 11/04/2011   Age-related osteoporosis without fracture 06/01/2011   Vitamin D deficiency 04/16/2011   Hypertension associated with diabetes (HCC) 01/11/2011    PCP: Willow Ora, MD   REFERRING PROVIDER:   Windle Guard, MD    REFERRING DIAG: M54.51 (ICD-10-CM) - Vertebrogenic low back pain   Pool therapy requested  Rationale for Evaluation and Treatment: Rehabilitation  THERAPY DIAG:  Pain, lumbar region - Plan: PT plan of care cert/re-cert  Difficulty in walking, not elsewhere classified - Plan: PT plan of care cert/re-cert  Pain in right hip - Plan: PT plan of care cert/re-cert  ONSET DATE: >6 months  SUBJECTIVE:  SUBJECTIVE STATEMENT:  Pt returns to therapy due to worsening pain. Pt does still do water 1-2x week and does water yoga. Pt will sometimes feel more pain with land exercise but it feels much better. Pt states the pain is worse with ADL. She has to sit down more frequently. She continues to describe low back pain with radiating pain down both legs. Pt does have worsening pain with standing up  walking. Pt has seen Dr. Shon Baton to discuss surgery earlier in the year. Pt would like to to avoid surgery. Pt can walk for about 10-15 minutes before feeling pain. Pt has NT into the toes with standing. Pt does feel the legs go weak but no falls. Pt feels incontinence with walking. Pt states that distal symptoms have gotten worse since May seeing Dr. Drucie Ip. Pt does notice blood in the stool as well.    PERTINENT HISTORY:  Diabetes, CKD3, and pacemaker   PAIN:  Are you having pain? no: NPRS scale: 0/10 Pain location: R side L/S and lateral R leg; sometimes on L Pain  description: Achey, stifness Aggravating factors: voltareng gel, tylenol, lidocaine patches, sitting down and laying down Relieving factors: standing, movement, reaching high shelves, extension   PRECAUTIONS: ICD/Pacemaker and Other: DM2  WEIGHT BEARING RESTRICTIONS: No  FALLS:  Has patient fallen in last 6 months? No  LIVING ENVIRONMENT: Lives with: lives with their spouse Lives in: House/apartment Stairs: 3 steps to enter Has following equipment at home: None  OCCUPATION: N/A  PLOF: Independent with basic ADLs  PATIENT GOALS: avoid surgery, be able to stand up effectively, be stronger into legs  Returns to PCP: 07/07/22   OBJECTIVE:   DIAGNOSTIC FINDINGS:    IMPRESSION: 1. L4-L5 moderate to severe spinal canal stenosis and mild left neural foraminal narrowing. Effacement of the lateral recesses at this level likely compresses the descending L5 nerve roots. 2. L5-S1 right subarticular disc protrusion effaces the right lateral recess and contacts the descending right S1 nerve roots. Narrowing of the left lateral recess at this level likely affects the descending left S1 nerve roots.    PATIENT SURVEYS:  FOTO 40 54 @ DC 9 pts MCII  SCREENING FOR RED FLAGS: Bowel or bladder incontinence: Yes: incontinence worsening Spinal tumors: No Cauda equina syndrome: Yes, feelings of legs giving way and suddenly going weak Compression fracture: No Abdominal aneurysm: No  COGNITION: Overall cognitive status: Within functional limits for tasks assessed     SENSATION: Light touch: Impaired    POSTURE: decreased lumbar lordosis   LUMBAR ROM:   AROM eval  Flexion 75%  Extension 10% p!  Right lateral flexion 50% p!  Left lateral flexion 75% (relief)  Right rotation 50% p!  Left rotation 50% p!   (Blank rows = not tested)  LOWER EXTREMITY :   MMT  MMT (in lbs) Right eval Left eval  Hip flexion 13.9 21.2  Hip extension    Hip abduction 20.0 27.0  Hip  adduction    Hip internal rotation    Hip external rotation    Knee flexion    Knee extension 14.5 21.5  Ankle dorsiflexion    Ankle plantarflexion    Ankle inversion    Ankle eversion     (Blank rows = not tested)  No myotomal weakness noted L2-L5 but R sided weakness as noted above  LUMBAR SPECIAL TESTS:  Straight leg raise test: Positive and Slump test: Positive  Reflexes:  L3 1+ bilat S1 0 bilat  FUNCTIONAL TESTS:  5 times sit to stand: unable to perform without UE assistance  GAIT: Distance walked: 17ft Assistive device utilized: None Level of assistance: Complete Independence Comments: decreased step length on L, poor foot clearance  TODAY'S TREATMENT:                                                                                                                              DATE: 7/1  Not rendered today given red flag subjective     PATIENT EDUCATION:  Education details: MOI, diagnosis, prognosis, anatomy, exercise progression, DOMS expectations, muscle firing,  envelope of function, HEP, POC  Person educated: Patient Education method: Explanation, Demonstration, Tactile cues, Verbal cues, and Handouts Education comprehension: verbalized understanding, returned demonstration, verbal cues required, tactile cues required, and needs further education  HOME EXERCISE PROGRAM: N/A  ASSESSMENT:  CLINICAL IMPRESSION: Patient is a 71 y.o. female who was seen today for physical therapy evaluation and treatment for c/c of LBP. Pt's s/s appear consistent with history of lumbar stenosis. Of concern, pt endorses red flag changes such as sudden onset of bilateral LE weakness as well as bowel and bladder changes that have been worsening with the pain. S/s do not appear emergent but pt advised strongly to bring up her symptoms at her upcoming PCP visit this week. Pt  also advised to consult with neurologist/spine specialist for clearance before returning to PT. Pt's last known  imaging was > 6 months ago. Pt's pain is highly sensitive and irritable with movement. Plan to continue with aquatic rehab and lumbopelvic strength at future sessions once pt has been cleared to continue with therapy. Pt would benefit from continued skilled therapy in order to reach goals and maximize functional lumbopelvic strength and ROM for preventing of further functional decline.    OBJECTIVE IMPAIRMENTS: Abnormal gait, decreased activity tolerance, decreased balance, difficulty walking, decreased ROM, decreased strength, increased muscle spasms, impaired flexibility, improper body mechanics, postural dysfunction, and pain.    ACTIVITY LIMITATIONS: carrying, lifting, bending, standing, squatting, stairs, transfers, bed mobility, reach over head, and locomotion level   PARTICIPATION LIMITATIONS: meal prep, cleaning, laundry, and community activity   PERSONAL FACTORS: Fitness and Time since onset of injury/illness/exacerbation, 2+ comorbidities are also affecting patient's functional outcome.    REHAB POTENTIAL: fair   CLINICAL DECISION MAKING: unstable/complicated   EVALUATION COMPLEXITY: moderate   GOALS:  SHORT TERM GOALS: To be set once cleared for return to PT  LONG TERM GOALS: To be set  PLAN:  PT FREQUENCY: 1-2x/week  PT DURATION: 12 weeks  PLANNED INTERVENTIONS: Therapeutic exercises, Therapeutic activity, Neuromuscular re-education, Balance training, Gait training, Patient/Family education, Self Care, Joint mobilization, Stair training, Aquatic Therapy, Dry Needling, Spinal mobilization, Cryotherapy, Moist heat, Taping, Traction, Manual therapy, and Re-evaluation.   PLAN FOR NEXT SESSION: N/A   Zebedee Iba, PT 07/05/2022, 1:04 PM   Referring diagnosis? M54.51 (ICD-10-CM) - Vertebrogenic low back pain  Treatment diagnosis? (if different than referring diagnosis) m54.50, r26.2, m25.551  What was this (referring dx) caused by? []  Surgery []  Fall [x]  Ongoing issue [x]   Arthritis []  Other: ____________  Laterality: []  Rt []  Lt [x]  Both  Check all possible CPT codes:      []  97110 (Therapeutic Exercise)  []  92507 (SLP Treatment)  []  97112 (Neuro Re-ed)   []  92526 (Swallowing Treatment)   []  97116 (Gait Training)   []  [id number] (Cognitive Training, 1st 15 minutes) []  97140 (Manual Therapy)   []  97130 (Cognitive Training, each add'l 15 minutes)  []  97530 (Therapeutic Activities)  []  Other, List CPT Code ____________    []  97535 (Self Care)       [x]  All codes above (97110 - 97535)  [x]  97012 (Mechanical Traction)  [x]  97014 (E-stim Unattended)  [x]  97032 (E-stim manual)  [x]  97033 (Ionto)  [x]  97035 (Ultrasound)  [x]  97760 (Orthotic Fit) []  97750 (Physical Performance Training) [x]  [id number] (Aquatic Therapy) []  97034 (Contrast Bath) []  [id number] (Paraffin) []  16109 (Wound Care 1st 20 sq cm) []  97598 (Wound Care each add'l 20 sq cm) []  97016 (Vasopneumatic Device) []  [phone] Public affairs consultant) []  [id number] (Prosthetic Training)

## 2022-07-07 ENCOUNTER — Ambulatory Visit (INDEPENDENT_AMBULATORY_CARE_PROVIDER_SITE_OTHER): Payer: Medicare PPO | Admitting: Family Medicine

## 2022-07-07 ENCOUNTER — Encounter: Payer: Self-pay | Admitting: Family Medicine

## 2022-07-07 VITALS — BP 118/74 | HR 85 | Temp 97.9°F | Ht 62.0 in | Wt 201.6 lb

## 2022-07-07 DIAGNOSIS — M818 Other osteoporosis without current pathological fracture: Secondary | ICD-10-CM | POA: Diagnosis not present

## 2022-07-07 DIAGNOSIS — M48062 Spinal stenosis, lumbar region with neurogenic claudication: Secondary | ICD-10-CM

## 2022-07-07 DIAGNOSIS — Z780 Asymptomatic menopausal state: Secondary | ICD-10-CM

## 2022-07-07 DIAGNOSIS — E1169 Type 2 diabetes mellitus with other specified complication: Secondary | ICD-10-CM

## 2022-07-07 DIAGNOSIS — E782 Mixed hyperlipidemia: Secondary | ICD-10-CM | POA: Diagnosis not present

## 2022-07-07 DIAGNOSIS — I152 Hypertension secondary to endocrine disorders: Secondary | ICD-10-CM | POA: Diagnosis not present

## 2022-07-07 DIAGNOSIS — Z7985 Long-term (current) use of injectable non-insulin antidiabetic drugs: Secondary | ICD-10-CM | POA: Diagnosis not present

## 2022-07-07 DIAGNOSIS — N1831 Chronic kidney disease, stage 3a: Secondary | ICD-10-CM

## 2022-07-07 DIAGNOSIS — E1142 Type 2 diabetes mellitus with diabetic polyneuropathy: Secondary | ICD-10-CM | POA: Diagnosis not present

## 2022-07-07 DIAGNOSIS — E1159 Type 2 diabetes mellitus with other circulatory complications: Secondary | ICD-10-CM | POA: Diagnosis not present

## 2022-07-07 DIAGNOSIS — E1122 Type 2 diabetes mellitus with diabetic chronic kidney disease: Secondary | ICD-10-CM

## 2022-07-07 LAB — LIPID PANEL
Cholesterol: 175 mg/dL (ref 0–200)
HDL: 51.6 mg/dL (ref >39.00)
LDL Cholesterol: 99 mg/dL (ref 0–99)
NonHDL: 123.55
Total CHOL/HDL Ratio: 3
Triglycerides: 123 mg/dL (ref 0.0–149.0)
VLDL: 24.6 mg/dL (ref 0.0–40.0)

## 2022-07-07 LAB — COMPREHENSIVE METABOLIC PANEL
ALT: 18 U/L (ref 0–35)
AST: 17 U/L (ref 0–37)
Albumin: 4.2 g/dL (ref 3.5–5.2)
Alkaline Phosphatase: 54 U/L (ref 39–117)
BUN: 27 mg/dL — ABNORMAL HIGH (ref 6–23)
CO2: 29 mEq/L (ref 19–32)
Calcium: 10.1 mg/dL (ref 8.4–10.5)
Chloride: 101 mEq/L (ref 96–112)
Creatinine, Ser: 1.44 mg/dL — ABNORMAL HIGH (ref 0.40–1.20)
GFR: 36.62 mL/min — ABNORMAL LOW (ref >60.00)
Glucose, Bld: 165 mg/dL — ABNORMAL HIGH (ref 70–99)
Potassium: 4.5 mEq/L (ref 3.5–5.1)
Sodium: 137 mEq/L (ref 135–145)
Total Bilirubin: 0.4 mg/dL (ref 0.2–1.2)
Total Protein: 7.7 g/dL (ref 6.0–8.3)

## 2022-07-07 LAB — POCT GLYCOSYLATED HEMOGLOBIN (HGB A1C): Hemoglobin A1C: 7.7 % — AB (ref 4.0–5.6)

## 2022-07-07 MED ORDER — ROSUVASTATIN CALCIUM 10 MG PO TABS: 10.0000 mg | ORAL_TABLET | Freq: Every day | ORAL | 3 refills | Status: DC

## 2022-07-07 MED ORDER — TRULICITY 3 MG/0.5ML ~~LOC~~ SOAJ: 3.0000 mg | SUBCUTANEOUS | 3 refills | Status: DC

## 2022-07-07 NOTE — Progress Notes (Signed)
Subjective  CC:  Chief Complaint  Patient presents with   Follow-up    6 month follow up htn diabetes , pt hasn't been checking b/s or b/p  , pt has missed some couple of medication. PT said that stated needs follow up spine specialist or neruo. Patient is fasting for labs     HPI: Amy Bartlett is a 72 y.o. female who presents to the office today for follow up of diabetes and problems listed above in the chief complaint.  Diabetes follow up: Patient remains on Jardiance, Trulicity and working on diet.  Unable to exercise due to back problems.  Weight has increased by 9 pounds since January.  She denies symptoms of hypoglycemia.  A1c in January was elevated and patient was going to work on diet.  She is hesitant to start new medications.  She reports she is compliant with her medications now.  She has neuropathy, no foot sores. Hypertension is well-controlled.  No chest pain. Reviewed orthopedic notes and physical therapy notes: Reviewed MRI.  Lumbar stenosis with neurogenic claudication.  Having trouble with walking or standing for prolonged periods of time.  Denies weakness of the extremity.  She does have overactive bladder and urinary incontinence intermittently.  This is unchanged.  She has seen the neurosurgeon but would like to defer surgery.  Orthopedics think she needs to follow-up with neurosurgery again.,  She sees Dr. Shon Baton.  She is not taking pain medications. Due for follow-up bone density on Fosamax, this will be her fifth year. Hyperlipidemia: She had not been taking her statin when last checked in January.  She reports that she is taking rosuvastatin now.  Takes nightly and feels that she tolerates it well.  Nonfasting for recheck today  Wt Readings from Last 3 Encounters:  07/07/22 201 lb 9.6 oz (91.4 kg)  03/29/22 197 lb 6.4 oz (89.5 kg)  01/06/22 192 lb 3.2 oz (87.2 kg)    BP Readings from Last 3 Encounters:  07/07/22 118/74  03/29/22 112/76  01/06/22  108/60    Assessment  1. Type 2 diabetes mellitus with stage 3b chronic kidney disease, without long-term current use of insulin (HCC)   2. Age-related osteoporosis without fracture   3. Asymptomatic menopausal state   4. Stage 3a chronic kidney disease (HCC)   5. Hypertension associated with diabetes (HCC)   6. Combined hyperlipidemia associated with type 2 diabetes mellitus (HCC)   7. Diabetic peripheral neuropathy associated with type 2 diabetes mellitus (HCC)   8. Long-term (current) use of injectable non-insulin antidiabetic drugs      Plan  Diabetes is currently marginally controlled.  Patient is very hesitant to change medications but because her A1c remains uncontrolled, I recommend increasing Trulicity to 3 mg weekly while continuing Jardiance.  She is willing to do so.  Continue to eat a healthy diet.  Her exercise is limiting, in the past she exercised rigorously to maintain diabetic control.  She is no longer able to do this. Hypertension remains controlled Hyperlipidemia: She reports she is now back on rosuvastatin 10 nightly.  Recheck nonfasting lipids and LFTs.  Goal LDL less than 70.  She has a history of noncompliance with medications at times.  Discussed the importance of taking medications regularly. Spinal stenosis with neurogenic claudication: No symptoms of cauda equina however uncontrolled.  Recommend follow-up with neurosurgery to see if there is any further interventions that could be helpful for her.  Education given. Monitoring kidney disease. Recheck bone density,  patient to schedule.  Continue Fosamax.  She may be eligible for drug holiday if levels are stable.  I spent a total of 45 minutes for this patient encounter. Time spent included preparation, face-to-face counseling with the patient and coordination of care, review of chart and records, and documentation of the encounter.  Follow up: 3 mo for diabetes recheck.  Orders Placed This Encounter  Procedures    DG Bone Density   Comprehensive metabolic panel   Lipid panel   POCT glycosylated hemoglobin (Hb A1C)   Meds ordered this encounter  Medications   rosuvastatin (CRESTOR) 10 MG tablet    Sig: Take 1 tablet (10 mg total) by mouth daily.    Dispense:  90 tablet    Refill:  3   Dulaglutide (TRULICITY) 3 MG/0.5ML SOPN    Sig: Inject 3 mg as directed once a week.    Dispense:  6 mL    Refill:  3      Immunization History  Administered Date(s) Administered   Fluad Quad(high Dose 65+) 09/08/2018, 11/12/2019, 10/17/2020, 10/30/2021   PFIZER(Purple Top)SARS-COV-2 Vaccination 02/11/2019, 03/04/2019, 10/15/2019   PNEUMOCOCCAL CONJUGATE-20 12/18/2020   Pfizer Covid-19 Vaccine Bivalent Booster 59yrs & up 11/18/2020   Tdap 04/14/2011   Zoster Recombinant(Shingrix) 02/04/2021   Zoster, Live 04/14/2011    Diabetes Related Lab Review: Lab Results  Component Value Date   HGBA1C 7.7 (A) 07/07/2022   HGBA1C 7.6 (H) 01/06/2022   HGBA1C 6.7 (A) 09/15/2021    Lab Results  Component Value Date   MICROALBUR <0.7 01/06/2022   Lab Results  Component Value Date   CREATININE 1.44 (H) 07/07/2022   BUN 27 (H) 07/07/2022   NA 137 07/07/2022   K 4.5 07/07/2022   CL 101 07/07/2022   CO2 29 07/07/2022   Lab Results  Component Value Date   CHOL 175 07/07/2022   CHOL 212 (H) 01/06/2022   CHOL 124 12/18/2020   Lab Results  Component Value Date   HDL 51.60 07/07/2022   HDL 66.10 01/06/2022   HDL 48.10 12/18/2020   Lab Results  Component Value Date   LDLCALC 99 07/07/2022   LDLCALC 120 (H) 01/06/2022   LDLCALC 59 12/18/2020   Lab Results  Component Value Date   TRIG 123.0 07/07/2022   TRIG 129.0 01/06/2022   TRIG 84.0 12/18/2020   Lab Results  Component Value Date   CHOLHDL 3 07/07/2022   CHOLHDL 3 01/06/2022   CHOLHDL 3 12/18/2020   Lab Results  Component Value Date   LDLDIRECT 134.0 01/02/2020   The 10-year ASCVD risk score (Arnett DK, et al., 2019) is: 21.5%   Values  used to calculate the score:     Age: 73 years     Sex: Female     Is Non-Hispanic African American: Yes     Diabetic: Yes     Tobacco smoker: No     Systolic Blood Pressure: 118 mmHg     Is BP treated: Yes     HDL Cholesterol: 51.6 mg/dL     Total Cholesterol: 175 mg/dL I have reviewed the PMH, Fam and Soc history. Patient Active Problem List   Diagnosis Date Noted   Class 2 severe obesity due to excess calories with serious comorbidity and body mass index (BMI) of 35.0 to 35.9 in adult [facility]) 03/10/2021    Priority: High   Diabetic peripheral neuropathy associated with type 2 diabetes mellitus (HCC) 12/18/2020    Priority: High   Cardiac pacemaker in  situ 03/19/2019    Priority: High    Added automatically from request for surgery [id number]    CKD (chronic kidney disease) stage 3, GFR 30-59 ml/min (HCC) 10/04/2016    Priority: High   NSVT (nonsustained ventricular tachycardia) 12/05/2015    Priority: High    Noted on PPM.  Echo on 08/2015 EF normal     History of complete heart block 10/04/2012    Priority: High    Overview:  Dr. Mayme Genta, S/p pacemaker, stable 2014/cla No ischemia by stress testing 2011    Combined hyperlipidemia associated with type 2 diabetes mellitus (HCC) 11/04/2011    Priority: High   Type 2 diabetes mellitus with stage 3b chronic kidney disease, without long-term current use of insulin (HCC) 11/04/2011    Priority: High   Hypertension associated with diabetes (HCC) 01/11/2011    Priority: High   Spondylosis of lumbar region without myelopathy or radiculopathy 07/14/2020    Priority: Medium    Obesity (BMI 30-39.9) 01/12/2016    Priority: Medium    Primary localized osteoarthrosis of left lower leg 07/16/2014    Priority: Medium     Talar dome, toe    Age-related osteoporosis without fracture 06/01/2011    Priority: Medium     Dexa 2022: T = -1.6 lowest, improved on fosamax x 3 years. Continue x 2 more years and recheck dexa 2024. Drug holiday if  stable at that time. T = -2.6 04/2017 in L hip. New dx: started fosamax 06/2017 T = -1.2 05/2011; recheck at age 72.    Hyponatremia - HCTZ 07/15/2020    Priority: Low   Vitamin D deficiency 04/16/2011    Priority: Low   Peripheral vascular disease with claudication (HCC) 01/06/2022   Spinal stenosis of lumbar region with neurogenic claudication 01/06/2022    Social History: Patient  reports that she has never smoked. She has never used smokeless tobacco. She reports that she does not drink alcohol and does not use drugs.  Review of Systems: Ophthalmic: negative for eye pain, loss of vision or double vision Cardiovascular: negative for chest pain Respiratory: negative for SOB or persistent cough Gastrointestinal: negative for abdominal pain Genitourinary: negative for dysuria or gross hematuria MSK: negative for foot lesions Neurologic: negative for weakness or gait disturbance  Objective  Vitals: BP 118/74   Pulse 85   Temp 97.9 F (36.6 C)   Ht 5\' 2"  (1.575 m)   Wt 201 lb 9.6 oz (91.4 kg)   SpO2 96%   BMI 36.87 kg/m  General: well appearing, no acute distress  Psych:  Alert and oriented, normal mood and affect HEENT:  Normocephalic, atraumatic, moist mucous membranes, supple neck  Cardiovascular:  Nl S1 and S2, RRR without murmur, gallop or rub. no edema Respiratory:  Good breath sounds bilaterally, CTAB with normal effort, no rales    Diabetic education: ongoing education regarding chronic disease management for diabetes was given today. We continue to reinforce the ABC's of diabetic management: A1c (<7 or 8 dependent upon patient), tight blood pressure control, and cholesterol management with goal LDL < 100 minimally. We discuss diet strategies, exercise recommendations, medication options and possible side effects. At each visit, we review recommended immunizations and preventive care recommendations for diabetics and stress that good diabetic control can prevent other  problems. See below for this patient's data.   Commons side effects, risks, benefits, and alternatives for medications and treatment plan prescribed today were discussed, and the patient expressed understanding of the given instructions.  Patient is instructed to call or message via MyChart if he/she has any questions or concerns regarding our treatment plan. No barriers to understanding were identified. We discussed Red Flag symptoms and signs in detail. Patient expressed understanding regarding what to do in case of urgent or emergency type symptoms.  Medication list was reconciled, printed and provided to the patient in AVS. Patient instructions and summary information was reviewed with the patient as documented in the AVS. This note was prepared with assistance of Dragon voice recognition software. Occasional wrong-word or sound-a-like substitutions may have occurred due to the inherent limitations of voice recognition software

## 2022-07-07 NOTE — Patient Instructions (Addendum)
Please return in 3 months for diabetes follow up   I will release your lab results to you on your MyChart account with further instructions. You may see the results before I do, but when I review them I will send you a message with my report or have my assistant call you if things need to be discussed. Please reply to my message with any questions. Thank you!   Please look at your medication list and be sure you are taking all the medications listed.  I have refilled your rosuvastatin for your cholesterol.  I have increased the dose of your trulicity for your diabetes which needs better control as we discussed.   See Dr. Shon Baton to help with your back.   Please call the office checked below to schedule your appointment for your mammogram and/or bone density screen (the checked studies were ordered): []   Mammogram  [x]   Bone Density  [x]   [facility]     [address] [place], [place]        [phone]         []   [facility]  [address] [place], [place]  [phone]  If you have any questions or concerns, please don't hesitate to send me a message via MyChart or call the office at [phone]. Thank you for visiting with [place] today! It's our pleasure caring for you.

## 2022-07-13 DIAGNOSIS — I442 Atrioventricular block, complete: Secondary | ICD-10-CM | POA: Diagnosis not present

## 2022-07-13 DIAGNOSIS — Z950 Presence of cardiac pacemaker: Secondary | ICD-10-CM | POA: Diagnosis not present

## 2022-07-19 ENCOUNTER — Other Ambulatory Visit: Payer: Self-pay | Admitting: Family Medicine

## 2022-07-20 ENCOUNTER — Other Ambulatory Visit: Payer: Self-pay

## 2022-07-20 DIAGNOSIS — E785 Hyperlipidemia, unspecified: Secondary | ICD-10-CM

## 2022-07-20 MED ORDER — ROSUVASTATIN CALCIUM 20 MG PO TABS
20.0000 mg | ORAL_TABLET | Freq: Every evening | ORAL | 3 refills | Status: DC
Start: 2022-07-20 — End: 2023-09-06

## 2022-07-20 NOTE — Progress Notes (Signed)
Please call patient.  Let her know her cholesterol remains too high and I recommend increasing her Crestor to 20 mg nightly.  Please order the new pill size for her. Her diabetic control is elevated so the Trulicity increase should also help.  Her kidney function is stable and remains mildly low.  Avoid anti-inflammatory pain medications.  Follow-up in 3 months to reassess her cholesterol and diabetes

## 2022-09-02 ENCOUNTER — Other Ambulatory Visit: Payer: Self-pay | Admitting: Family Medicine

## 2022-09-02 DIAGNOSIS — M818 Other osteoporosis without current pathological fracture: Secondary | ICD-10-CM

## 2022-09-02 DIAGNOSIS — Z780 Asymptomatic menopausal state: Secondary | ICD-10-CM

## 2022-09-13 DIAGNOSIS — E119 Type 2 diabetes mellitus without complications: Secondary | ICD-10-CM | POA: Diagnosis not present

## 2022-09-13 DIAGNOSIS — H04123 Dry eye syndrome of bilateral lacrimal glands: Secondary | ICD-10-CM | POA: Diagnosis not present

## 2022-09-13 DIAGNOSIS — H2513 Age-related nuclear cataract, bilateral: Secondary | ICD-10-CM | POA: Diagnosis not present

## 2022-09-13 LAB — HM DIABETES EYE EXAM

## 2022-09-20 ENCOUNTER — Encounter: Payer: Self-pay | Admitting: Family Medicine

## 2022-10-11 ENCOUNTER — Encounter: Payer: Self-pay | Admitting: Family Medicine

## 2022-10-11 ENCOUNTER — Ambulatory Visit (INDEPENDENT_AMBULATORY_CARE_PROVIDER_SITE_OTHER): Payer: Medicare PPO | Admitting: Family Medicine

## 2022-10-11 VITALS — BP 120/78 | HR 85 | Temp 97.1°F | Ht 62.0 in | Wt 199.8 lb

## 2022-10-11 DIAGNOSIS — N1832 Chronic kidney disease, stage 3b: Secondary | ICD-10-CM | POA: Diagnosis not present

## 2022-10-11 DIAGNOSIS — E1169 Type 2 diabetes mellitus with other specified complication: Secondary | ICD-10-CM

## 2022-10-11 DIAGNOSIS — E1159 Type 2 diabetes mellitus with other circulatory complications: Secondary | ICD-10-CM | POA: Diagnosis not present

## 2022-10-11 DIAGNOSIS — Z23 Encounter for immunization: Secondary | ICD-10-CM

## 2022-10-11 DIAGNOSIS — Z7985 Long-term (current) use of injectable non-insulin antidiabetic drugs: Secondary | ICD-10-CM | POA: Diagnosis not present

## 2022-10-11 DIAGNOSIS — E782 Mixed hyperlipidemia: Secondary | ICD-10-CM | POA: Diagnosis not present

## 2022-10-11 DIAGNOSIS — N1831 Chronic kidney disease, stage 3a: Secondary | ICD-10-CM

## 2022-10-11 DIAGNOSIS — E1122 Type 2 diabetes mellitus with diabetic chronic kidney disease: Secondary | ICD-10-CM

## 2022-10-11 DIAGNOSIS — I152 Hypertension secondary to endocrine disorders: Secondary | ICD-10-CM

## 2022-10-11 LAB — POCT GLYCOSYLATED HEMOGLOBIN (HGB A1C): Hemoglobin A1C: 7.2 % — AB (ref 4.0–5.6)

## 2022-10-11 LAB — LIPID PANEL
Cholesterol: 126 mg/dL (ref 0–200)
HDL: 52.4 mg/dL (ref >39.00)
LDL Cholesterol: 51 mg/dL (ref 0–99)
NonHDL: 73.76
Total CHOL/HDL Ratio: 2
Triglycerides: 113 mg/dL (ref 0.0–149.0)
VLDL: 22.6 mg/dL (ref 0.0–40.0)

## 2022-10-11 MED ORDER — EMPAGLIFLOZIN 25 MG PO TABS: 25.0000 mg | ORAL_TABLET | Freq: Every day | ORAL | 3 refills | Status: AC

## 2022-10-11 NOTE — Patient Instructions (Signed)
Please return in 6 months for your annual complete physical; please come fasting.   If you have any questions or concerns, please don't hesitate to send me a message via MyChart or call the office at [phone]. Thank you for visiting with us today! It's our pleasure caring for you.  

## 2022-10-11 NOTE — Progress Notes (Signed)
Subjective  CC:  Chief Complaint  Patient presents with   Diabetes   Medical Management of Chronic Issues   Medication Management    Pt here for f/u on new statin medication Crestor 20mg  w.o any complaints. Discuss Shringrix vaccine, pt may have had 2nd dose      HPI: Amy Bartlett is a 72 y.o. female who presents to the office today for follow up of diabetes and problems listed above in the chief complaint.  Diabetes follow up: Her diabetic control is reported as Improved. Eating a little better and tolerating the 3mg  dose of trulicity with jardiance 25 daily.  She denies exertional CP or SOB or symptomatic hypoglycemia. She denies foot sores or paresthesias.  Tolerating Crestor 20 nightly.  Due for fasting lipid recheck to see if at goal now.  No side effects Chronic kidney disease without edema. Blood pressure: No chest pain or shortness of breath.  Wt Readings from Last 3 Encounters:  10/11/22 199 lb 12.8 oz (90.6 kg)  07/07/22 201 lb 9.6 oz (91.4 kg)  03/29/22 197 lb 6.4 oz (89.5 kg)    BP Readings from Last 3 Encounters:  10/11/22 120/78  07/07/22 118/74  03/29/22 112/76    Assessment  1. Type 2 diabetes mellitus with stage 3b chronic kidney disease, without long-term current use of insulin (HCC)   2. Need for influenza vaccination   3. Combined hyperlipidemia associated with type 2 diabetes mellitus (HCC)   4. Long-term (current) use of injectable non-insulin antidiabetic drugs   5. Stage 3a chronic kidney disease (HCC)   6. Hypertension associated with diabetes (HCC)      Plan  Diabetes is currently adequately controlled.  Goal A1c less than 7.  Patient hesitant to add new medications.  Will work harder on diet and try to increase exercise, walking etc.  Continue Jardiance and Trulicity.  Recheck 6 months. Hyperlipidemia: Recheck fasting lipids on Crestor 20 Continue antihypertensives.  Blood pressure well-controlled. Monitoring renal function Flu  shot updated today.  Patient thinks she has had her second Shingrix but is not sure.  She will check with pharmacy.  Follow up: 6 months for complete physical Orders Placed This Encounter  Procedures   Flu Vaccine Trivalent High Dose (Fluad)   COMPLETE METABOLIC PANEL WITH GFR   Lipid panel   POCT HgB A1C   Meds ordered this encounter  Medications   empagliflozin (JARDIANCE) 25 MG TABS tablet    Sig: Take 1 tablet (25 mg total) by mouth daily.    Dispense:  90 tablet    Refill:  3      Immunization History  Administered Date(s) Administered   Fluad Quad(high Dose 65+) 09/08/2018, 11/12/2019, 10/17/2020, 10/30/2021   Fluad Trivalent(High Dose 65+) 10/11/2022   PFIZER(Purple Top)SARS-COV-2 Vaccination 02/11/2019, 03/04/2019, 10/15/2019   PNEUMOCOCCAL CONJUGATE-20 12/18/2020   Pfizer Covid-19 Vaccine Bivalent Booster 78yrs & up 11/18/2020   Tdap 04/14/2011   Zoster Recombinant(Shingrix) 02/04/2021   Zoster, Live 04/14/2011    Diabetes Related Lab Review: Lab Results  Component Value Date   HGBA1C 7.2 (A) 10/11/2022   HGBA1C 7.7 (A) 07/07/2022   HGBA1C 7.6 (H) 01/06/2022    Lab Results  Component Value Date   MICROALBUR <0.7 01/06/2022   Lab Results  Component Value Date   CREATININE 1.44 (H) 07/07/2022   BUN 27 (H) 07/07/2022   NA 137 07/07/2022   K 4.5 07/07/2022   CL 101 07/07/2022   CO2 29 07/07/2022   Lab  Results  Component Value Date   CHOL 175 07/07/2022   CHOL 212 (H) 01/06/2022   CHOL 124 12/18/2020   Lab Results  Component Value Date   HDL 51.60 07/07/2022   HDL 66.10 01/06/2022   HDL 48.10 12/18/2020   Lab Results  Component Value Date   LDLCALC 99 07/07/2022   LDLCALC 120 (H) 01/06/2022   LDLCALC 59 12/18/2020   Lab Results  Component Value Date   TRIG 123.0 07/07/2022   TRIG 129.0 01/06/2022   TRIG 84.0 12/18/2020   Lab Results  Component Value Date   CHOLHDL 3 07/07/2022   CHOLHDL 3 01/06/2022   CHOLHDL 3 12/18/2020   Lab  Results  Component Value Date   LDLDIRECT 134.0 01/02/2020   The 10-year ASCVD risk score (Arnett DK, et al., 2019) is: 22.1%   Values used to calculate the score:     Age: 3 years     Sex: Female     Is Non-Hispanic African American: Yes     Diabetic: Yes     Tobacco smoker: No     Systolic Blood Pressure: 120 mmHg     Is BP treated: Yes     HDL Cholesterol: 51.6 mg/dL     Total Cholesterol: 175 mg/dL I have reviewed the PMH, Fam and Soc history. Patient Active Problem List   Diagnosis Date Noted Date Diagnosed   Class 2 severe obesity due to excess calories with serious comorbidity and body mass index (BMI) of 35.0 to 35.9 in adult [facility]) 03/10/2021     Priority: High   Diabetic peripheral neuropathy associated with type 2 diabetes mellitus (HCC) 12/18/2020     Priority: High   Cardiac pacemaker in situ 03/19/2019     Priority: High    Added automatically from request for surgery [id number]    CKD (chronic kidney disease) stage 3, GFR 30-59 ml/min (HCC) 10/04/2016     Priority: High   NSVT (nonsustained ventricular tachycardia) 12/05/2015     Priority: High    Noted on PPM.  Echo on 08/2015 EF normal     History of complete heart block 10/04/2012     Priority: High    Overview:  Dr. Mayme Genta, S/p pacemaker, stable 2014/cla No ischemia by stress testing 2011    Combined hyperlipidemia associated with type 2 diabetes mellitus (HCC) 11/04/2011     Priority: High   Type 2 diabetes mellitus with stage 3b chronic kidney disease, without long-term current use of insulin (HCC) 11/04/2011     Priority: High   Hypertension associated with diabetes (HCC) 01/11/2011     Priority: High   Spondylosis of lumbar region without myelopathy or radiculopathy 07/14/2020     Priority: Medium    Obesity (BMI 30-39.9) 01/12/2016     Priority: Medium    Primary localized osteoarthrosis of left lower leg 07/16/2014     Priority: Medium     Talar dome, toe    Age-related osteoporosis without  fracture 06/01/2011     Priority: Medium     Dexa 2022: T = -1.6 lowest, improved on fosamax x 3 years. Continue x 2 more years and recheck dexa 2024. Drug holiday if stable at that time. T = -2.6 04/2017 in L hip. New dx: started fosamax 06/2017 T = -1.2 05/2011; recheck at age 44.    Hyponatremia - HCTZ 07/15/2020     Priority: Low   Vitamin D deficiency 04/16/2011     Priority: Low   Peripheral vascular disease  with claudication (HCC) 01/06/2022    Spinal stenosis of lumbar region with neurogenic claudication 01/06/2022     Social History: Patient  reports that she has never smoked. She has never used smokeless tobacco. She reports that she does not drink alcohol and does not use drugs.  Review of Systems: Ophthalmic: negative for eye pain, loss of vision or double vision Cardiovascular: negative for chest pain Respiratory: negative for SOB or persistent cough Gastrointestinal: negative for abdominal pain Genitourinary: negative for dysuria or gross hematuria MSK: negative for foot lesions Neurologic: negative for weakness or gait disturbance  Objective  Vitals: BP 120/78   Pulse 85   Temp (!) 97.1 F (36.2 C)   Ht 5\' 2"  (1.575 m)   Wt 199 lb 12.8 oz (90.6 kg)   SpO2 97%   BMI 36.54 kg/m  General: well appearing, no acute distress  Psych:  Alert and oriented, normal mood and affect  Diabetic education: ongoing education regarding chronic disease management for diabetes was given today. We continue to reinforce the ABC's of diabetic management: A1c (<7 or 8 dependent upon patient), tight blood pressure control, and cholesterol management with goal LDL < 100 minimally. We discuss diet strategies, exercise recommendations, medication options and possible side effects. At each visit, we review recommended immunizations and preventive care recommendations for diabetics and stress that good diabetic control can prevent other problems. See below for this patient's data.   Commons  side effects, risks, benefits, and alternatives for medications and treatment plan prescribed today were discussed, and the patient expressed understanding of the given instructions. Patient is instructed to call or message via MyChart if he/she has any questions or concerns regarding our treatment plan. No barriers to understanding were identified. We discussed Red Flag symptoms and signs in detail. Patient expressed understanding regarding what to do in case of urgent or emergency type symptoms.  Medication list was reconciled, printed and provided to the patient in AVS. Patient instructions and summary information was reviewed with the patient as documented in the AVS. This note was prepared with assistance of Dragon voice recognition software. Occasional wrong-word or sound-a-like substitutions may have occurred due to the inherent limitations of voice recognition software

## 2022-10-11 NOTE — Progress Notes (Signed)
See mychart note Dear [name], Your cholesterol levels are now perfect! Work on [employer] so we can get your A1c< 7.0 again.   Sincerely, Dr. Mardelle Matte

## 2022-10-12 DIAGNOSIS — Z950 Presence of cardiac pacemaker: Secondary | ICD-10-CM | POA: Diagnosis not present

## 2022-10-12 DIAGNOSIS — I442 Atrioventricular block, complete: Secondary | ICD-10-CM | POA: Diagnosis not present

## 2022-10-12 LAB — COMPLETE METABOLIC PANEL WITH GFR
AG Ratio: 1.4 (calc) (ref 1.0–2.5)
ALT: 21 U/L (ref 6–29)
AST: 18 U/L (ref 10–35)
Albumin: 4.4 g/dL (ref 3.6–5.1)
Alkaline phosphatase (APISO): 51 U/L (ref 37–153)
BUN/Creatinine Ratio: 19 (calc) (ref 6–22)
BUN: 28 mg/dL — ABNORMAL HIGH (ref 7–25)
CO2: 26 mmol/L (ref 20–32)
Calcium: 9.8 mg/dL (ref 8.6–10.4)
Chloride: 102 mmol/L (ref 98–110)
Creat: 1.45 mg/dL — ABNORMAL HIGH (ref 0.60–1.00)
Globulin: 3.1 g/dL (ref 1.9–3.7)
Glucose, Bld: 125 mg/dL — ABNORMAL HIGH (ref 65–99)
Potassium: 4.4 mmol/L (ref 3.5–5.3)
Sodium: 138 mmol/L (ref 135–146)
Total Bilirubin: 0.5 mg/dL (ref 0.2–1.2)
Total Protein: 7.5 g/dL (ref 6.1–8.1)
eGFR: 39 mL/min/{1.73_m2} — ABNORMAL LOW (ref >=60)

## 2022-10-16 ENCOUNTER — Other Ambulatory Visit: Payer: Self-pay | Admitting: Family Medicine

## 2022-11-04 ENCOUNTER — Ambulatory Visit: Payer: Medicare PPO

## 2022-11-04 VITALS — BP 122/82 | HR 94 | Temp 98.0°F | Wt 197.2 lb

## 2022-11-04 DIAGNOSIS — Z0000 Encounter for general adult medical examination without abnormal findings: Secondary | ICD-10-CM | POA: Diagnosis not present

## 2022-11-04 NOTE — Progress Notes (Signed)
Subjective:   Amy Bartlett is a 72 y.o. female who presents for Medicare Annual (Subsequent) preventive examination.  Visit Complete: In person  Patient Medicare AWV questionnaire was completed by the patient on 11/03/22; I have confirmed that all information answered by patient is correct and no changes since this date.  Cardiac Risk Factors include: hypertension;dyslipidemia;diabetes mellitus;advanced age (>48men, >38 women);obesity (BMI >30kg/m2)     Objective:    Today's Vitals   11/04/22 1525  BP: 122/82  Pulse: 94  Temp: 98 F (36.7 C)  SpO2: 95%  Weight: 197 lb 3.2 oz (89.4 kg)   Body mass index is 36.07 kg/m.     07/05/2022   11:09 AM 11/19/2021   11:00 AM 10/30/2021    9:40 AM 02/02/2021    8:20 PM 10/17/2020   10:23 AM 08/04/2020    8:56 AM 10/04/2018   12:52 PM  Advanced Directives  Does Patient Have a Medical Advance Directive? No No No No No No No  Would patient like information on creating a medical advance directive? No - Patient declined No - Patient declined No - Patient declined  Yes (MAU/Ambulatory/Procedural Areas - Information given) Yes (MAU/Ambulatory/Procedural Areas - Information given) Yes (MAU/Ambulatory/Procedural Areas - Information given)    Current Medications (verified) Outpatient Encounter Medications as of 11/04/2022  Medication Sig   ACCU-CHEK GUIDE test strip USE TO TEST UP TO 4 TIMES A DAY AS DIRECTED   alendronate (FOSAMAX) 70 MG tablet TAKE 1 TABLET BY MOUTH EVERY 7 DAYS WITH A FULL GLASS OF WATER ON AN EMPTY STOMACH   aspirin 81 MG EC tablet Take by mouth.   blood glucose meter kit and supplies Dispense based on patient and insurance preference. Use up to four times daily as directed. (FOR ICD-10 E10.9, E11.9).   Cholecalciferol 125 MCG (5000 UT) TABS Take by mouth.   diclofenac Sodium (VOLTAREN) 1 % GEL Apply 4 g topically 4 (four) times daily as needed.   Dulaglutide (TRULICITY) 3 MG/0.5ML SOPN Inject 3 mg as directed  once a week.   empagliflozin (JARDIANCE) 25 MG TABS tablet Take 1 tablet (25 mg total) by mouth daily.   fluticasone (FLONASE) 50 MCG/ACT nasal spray Place 2 sprays into both nostrils daily.   lidocaine (LIDODERM) 5 % SMARTSIG:Patch(s) Topical   lisinopril-hydrochlorothiazide (ZESTORETIC) 20-12.5 MG tablet Take 1 tablet by mouth daily.   loratadine (CLARITIN) 10 MG tablet Take 1 tablet (10 mg total) by mouth daily.   rosuvastatin (CRESTOR) 20 MG tablet Take 1 tablet (20 mg total) by mouth at bedtime.   No facility-administered encounter medications on file as of 11/04/2022.    Allergies (verified) Patient has no known allergies.   History: Past Medical History:  Diagnosis Date   Arthritis    Chicken pox    Diabetes mellitus (HCC)    Hyperlipidemia    Hypertension    Hyponatremia - HCTZ 07/15/2020   Osteopenia    Past Surgical History:  Procedure Laterality Date   CARDIAC PACEMAKER PLACEMENT     CESAREAN SECTION  1974, 1977, 1979   REDUCTION MAMMAPLASTY Bilateral 2009   TUBAL LIGATION     Family History  Problem Relation Age of Onset   Glaucoma Mother    Heart disease Mother        Had Pacemaker, Died on CHF    Hypertension Mother    Osteoporosis Mother    Pneumonia Father    Arthritis Father    Diabetes Brother    Stroke Brother  Kidney failure Brother    Healthy Daughter    Healthy Daughter    Social History   Socioeconomic History   Marital status: Married    Spouse name: Not on file   Number of children: Not on file   Years of education: Not on file   Highest education level: Bachelor's degree (e.g., BA, AB, BS)  Occupational History   Not on file  Tobacco Use   Smoking status: Never   Smokeless tobacco: Never  Vaping Use   Vaping status: Never Used  Substance and Sexual Activity   Alcohol use: Never   Drug use: Never   Sexual activity: Not Currently  Other Topics Concern   Not on file  Social History Narrative   Has 3 children- 2 living in  [place] and 1 in [place]    Social Determinants of Health   Financial Resource Strain: Low Risk  (11/03/2022)   Overall Financial Resource Strain (CARDIA)    Difficulty of Paying Living Expenses: Not hard at all  Food Insecurity: No Food Insecurity (11/03/2022)   Hunger Vital Sign    Worried About Running Out of Food in the Last Year: Never true    Ran Out of Food in the Last Year: Never true  Transportation Needs: No Transportation Needs (11/03/2022)   PRAPARE - Administrator, Civil Service (Medical): No    Lack of Transportation (Non-Medical): No  Physical Activity: Insufficiently Active (11/03/2022)   Exercise Vital Sign    Days of Exercise per Week: 1 day    Minutes of Exercise per Session: 20 min  Stress: No Stress Concern Present (11/03/2022)   [employer] of Occupational Health - Occupational Stress Questionnaire    Feeling of Stress : Not at all  Social Connections: Socially Integrated (11/03/2022)   Social Connection and Isolation Panel [NHANES]    Frequency of Communication with Friends and Family: More than three times a week    Frequency of Social Gatherings with Friends and Family: Once a week    Attends Religious Services: More than 4 times per year    Active Member of [employer] or Organizations: Yes    Attends Banker Meetings: 1 to 4 times per year    Marital Status: Married    Tobacco Counseling Counseling given: Not Answered   Clinical Intake:  Pre-visit preparation completed: Yes  Pain : No/denies pain     BMI - recorded: 38.07 Nutritional Status: BMI > 30  Obese Nutritional Risks: None Diabetes: Yes CBG done?: No Did pt. bring in CBG monitor from home?: No  How often do you need to have someone help you when you read instructions, pamphlets, or other written materials from your doctor or pharmacy?: 1 - Never  Interpreter Needed?: No  Information entered by :: Lanier Ensign, LPN   Activities of Daily  Living    11/03/2022   10:21 PM  In your present state of health, do you have any difficulty performing the following activities:  Hearing? 0  Vision? 0  Difficulty concentrating or making decisions? 0  Walking or climbing stairs? 0  Dressing or bathing? 0  Doing errands, shopping? 0  Preparing Food and eating ? N  Using the Toilet? N  In the past six months, have you accidently leaked urine? Y  Comment wears a pad  Do you have problems with loss of bowel control? N  Managing your Medications? N  Managing your Finances? N  Housekeeping or managing your Housekeeping? N  Patient Care Team: Willow Ora, MD as PCP - General (Family Medicine) Ernesto Rutherford, MD as Consulting Physician (Ophthalmology) Rosey Bath Corrie Mckusick, MD as Referring Physician (Nephrology) Mayme Genta Theotis Barrio, MD as Referring Physician (Cardiology) Dr. Albin Felling (Dentistry) Windle Guard, MD as Consulting Physician (Orthopedic Surgery)  Indicate any recent Medical Services you may have received from other than [facility] providers in the past year (date may be approximate).     Assessment:   This is a routine wellness examination for [place].  Hearing/Vision screen Hearing Screening - Comments:: Pt denies any hearing issues  Vision Screening - Comments:: Pt follows up with Dr Dione Booze for annual eye exams    Goals Addressed   None    Depression Screen    11/04/2022    3:32 PM 10/11/2022    9:46 AM 07/07/2022    9:33 AM 01/06/2022    9:46 AM 10/30/2021    9:39 AM 09/15/2021   10:01 AM 10/17/2020   10:22 AM  PHQ 2/9 Scores  PHQ - 2 Score 0 0 0 0 0 0 0  PHQ- 9 Score 0 1         Fall Risk    11/03/2022   10:21 PM 10/11/2022    9:46 AM 07/07/2022    9:32 AM 01/06/2022    9:46 AM 10/30/2021    9:41 AM  Fall Risk   Falls in the past year? 0 0 0 0 0  Number falls in past yr:  0 0 0 0  Injury with Fall?  0 0 0 0  Risk for fall due to : No Fall Risks No Fall Risks No Fall Risks No Fall Risks Impaired  vision;Impaired balance/gait;Impaired mobility  Follow up Falls prevention discussed Falls evaluation completed Falls prevention discussed;Falls evaluation completed Falls evaluation completed Falls prevention discussed    MEDICARE RISK AT HOME: Medicare Risk at Home Any stairs in or around the home?: Yes If so, are there any without handrails?: No Home free of loose throw rugs in walkways, pet beds, electrical cords, etc?: No Adequate lighting in your home to reduce risk of falls?: Yes Life alert?: No Use of a cane, walker or w/c?: No Grab bars in the bathroom?: Yes Shower chair or bench in shower?: Yes Elevated toilet seat or a handicapped toilet?: Yes  TIMED UP AND GO:  Was the test performed?  Yes  Length of time to ambulate 10 feet: 10 sec Gait steady and fast without use of assistive device    Cognitive Function:    10/04/2018   12:53 PM  MMSE - Mini Mental State Exam  Orientation to time 5  Orientation to Place 5  Registration 3  Attention/ Calculation 5  Recall 3  Language- name 2 objects 2  Language- repeat 1  Language- follow 3 step command 3  Language- read & follow direction 1  Write a sentence 1  Copy design 1  Total score 30        11/04/2022    3:36 PM 10/30/2021    9:43 AM 10/17/2020   10:26 AM  6CIT Screen  What Year? 0 points 0 points 0 points  What month? 0 points 0 points 0 points  What time? 0 points 0 points 0 points  Count back from 20 0 points 0 points 0 points  Months in reverse 0 points 0 points 0 points  Repeat phrase 0 points 0 points 2 points  Total Score 0 points 0 points 2 points  Immunizations Immunization History  Administered Date(s) Administered   Fluad Quad(high Dose 65+) 09/08/2018, 11/12/2019, 10/17/2020, 10/30/2021   Fluad Trivalent(High Dose 65+) 10/11/2022   PFIZER(Purple Top)SARS-COV-2 Vaccination 02/11/2019, 03/04/2019, 10/15/2019   PNEUMOCOCCAL CONJUGATE-20 12/18/2020   Pfizer Covid-19 Vaccine Bivalent Booster  10yrs & up 11/18/2020   Tdap 04/14/2011   Zoster Recombinant(Shingrix) 02/04/2021   Zoster, Live 04/14/2011    TDAP status: Due, Education has been provided regarding the importance of this vaccine. Advised may receive this vaccine at local pharmacy or Health Dept. Aware to provide a copy of the vaccination record if obtained from local pharmacy or Health Dept. Verbalized acceptance and understanding.  Flu Vaccine status: Up to date  Pneumococcal vaccine status: Up to date  Covid-19 vaccine status: Information provided on how to obtain vaccines.   Qualifies for Shingles Vaccine? Yes   Zostavax completed No   Shingrix Completed?: No.    Education has been provided regarding the importance of this vaccine. Patient has been advised to call insurance company to determine out of pocket expense if they have not yet received this vaccine. Advised may also receive vaccine at local pharmacy or Health Dept. Verbalized acceptance and understanding.  Screening Tests Health Maintenance  Topic Date Due   Zoster Vaccines- Shingrix (2 of 2) 04/01/2021   COVID-19 Vaccine (5 - 2023-24 season) 09/05/2022   DEXA SCAN  09/19/2022   Diabetic kidney evaluation - Urine ACR  01/07/2023   FOOT EXAM  01/07/2023   MAMMOGRAM  03/05/2023   HEMOGLOBIN A1C  04/11/2023   OPHTHALMOLOGY EXAM  09/13/2023   Diabetic kidney evaluation - eGFR measurement  10/11/2023   Medicare Annual Wellness (AWV)  11/04/2023   Colonoscopy  02/22/2027   Pneumonia Vaccine 24+ Years old  Completed   INFLUENZA VACCINE  Completed   HPV VACCINES  Aged Out   DTaP/Tdap/Td  Discontinued   Hepatitis C Screening  Discontinued    Health Maintenance  Health Maintenance Due  Topic Date Due   Zoster Vaccines- Shingrix (2 of 2) 04/01/2021   COVID-19 Vaccine (5 - 2023-24 season) 09/05/2022   DEXA SCAN  09/19/2022    Colorectal cancer screening: Type of screening: Colonoscopy. Completed 02/21/17. Repeat every 10 years  Mammogram status:  Completed 03/05/22. Repeat every year  Bone Density status: Ordered scheduled for 03/11/23. Pt provided with contact info and advised to call to schedule appt.   Additional Screening:   Vision Screening: Recommended annual ophthalmology exams for early detection of glaucoma and other disorders of the eye. Is the patient up to date with their annual eye exam?  Yes  Who is the provider or what is the name of the office in which the patient attends annual eye exams? Dr Dione Booze  If pt is not established with a provider, would they like to be referred to a provider to establish care? No .   Dental Screening: Recommended annual dental exams for proper oral hygiene  Diabetic Foot Exam: Diabetic Foot Exam: Completed 01/06/22  Community Resource Referral / Chronic Care Management: CRR required this visit?  No   CCM required this visit?  No     Plan:     I have personally reviewed and noted the following in the patient's chart:   Medical and social history Use of alcohol, tobacco or illicit drugs  Current medications and supplements including opioid prescriptions. Patient is not currently taking opioid prescriptions. Functional ability and status Nutritional status Physical activity Advanced directives List of other physicians Hospitalizations, surgeries, and ER  visits in previous 12 months Vitals Screenings to include cognitive, depression, and falls Referrals and appointments  In addition, I have reviewed and discussed with patient certain preventive protocols, quality metrics, and best practice recommendations. A written personalized care plan for preventive services as well as general preventive health recommendations were provided to patient.     Marzella Schlein, LPN   [id number]/25/[id number]   After Visit Summary: (In Person-Printed) AVS printed and given to the patient  Nurse Notes: none

## 2022-11-04 NOTE — Patient Instructions (Signed)
Ms. Badger , Thank you for taking time to come for your Medicare Wellness Visit. I appreciate your ongoing commitment to your health goals. Please review the following plan we discussed and let me know if I can assist you in the future.   Referrals/Orders/Follow-Ups/Clinician Recommendations: exercise more   This is a list of the screening recommended for you and due dates:  Health Maintenance  Topic Date Due   Zoster (Shingles) Vaccine (2 of 2) 04/01/2021   COVID-19 Vaccine (5 - 2023-24 season) 09/05/2022   DEXA scan (bone density measurement)  09/19/2022   Medicare Annual Wellness Visit  10/31/2022   Yearly kidney health urinalysis for diabetes  01/07/2023   Complete foot exam   01/07/2023   Mammogram  03/05/2023   Hemoglobin A1C  04/11/2023   Eye exam for diabetics  09/13/2023   Yearly kidney function blood test for diabetes  10/11/2023   Colon Cancer Screening  02/22/2027   Pneumonia Vaccine  Completed   Flu Shot  Completed   HPV Vaccine  Aged Out   DTaP/Tdap/Td vaccine  Discontinued   Hepatitis C Screening  Discontinued    Advanced directives: (Provided) Advance directive discussed with you today. I have provided a copy for you to complete at home and have notarized. Once this is complete, please bring a copy in to our office so we can scan it into your chart.   Next Medicare Annual Wellness Visit scheduled for next year: Yes

## 2022-11-05 ENCOUNTER — Ambulatory Visit (INDEPENDENT_AMBULATORY_CARE_PROVIDER_SITE_OTHER): Payer: Medicare PPO | Admitting: Family Medicine

## 2022-11-05 ENCOUNTER — Encounter: Payer: Self-pay | Admitting: Family Medicine

## 2022-11-05 VITALS — BP 120/74 | HR 68 | Temp 98.7°F | Ht 62.0 in | Wt 197.8 lb

## 2022-11-05 DIAGNOSIS — K219 Gastro-esophageal reflux disease without esophagitis: Secondary | ICD-10-CM

## 2022-11-05 DIAGNOSIS — M818 Other osteoporosis without current pathological fracture: Secondary | ICD-10-CM

## 2022-11-05 DIAGNOSIS — N1832 Chronic kidney disease, stage 3b: Secondary | ICD-10-CM

## 2022-11-05 DIAGNOSIS — E1122 Type 2 diabetes mellitus with diabetic chronic kidney disease: Secondary | ICD-10-CM | POA: Diagnosis not present

## 2022-11-05 NOTE — Progress Notes (Signed)
Subjective  CC:  Chief Complaint  Patient presents with   Gastroesophageal Reflux    Pt stated that it feels like something is always stuck in her throat. It will go away and then comes right back.   Diabetes    HPI: Amy Bartlett is a 72 y.o. female who presents to the office today for follow up of diabetes and problems listed above in the chief complaint.  Has intermittent reflux.  Has used omeprazole successfully in the past.  Over the last month, she has had symptoms come and go.  She is self treated for a week at a time intermittently.  Over the last few weeks she has water brash and GERD sxs. No hematemesis. Taking otc kirklands acid reducer x several days now. Starting to improve. No odynophagia or dysphagia. No abdominal pain. On fosamax since 2021 for osteoporosis.  Diabetes follow up: Her diabetic control is reported as Worse. Recent A1c 7.2. on trulicity and jardiance. She denies exertional CP or SOB or symptomatic hypoglycemia. She denies foot sores or paresthesias.  Wt Readings from Last 3 Encounters:  11/05/22 197 lb 12.8 oz (89.7 kg)  11/04/22 197 lb 3.2 oz (89.4 kg)  10/11/22 199 lb 12.8 oz (90.6 kg)    BP Readings from Last 3 Encounters:  11/05/22 120/74  11/04/22 122/82  10/11/22 120/78    Assessment  1. Gastroesophageal reflux disease, unspecified whether esophagitis present   2. Type 2 diabetes mellitus with stage 3b chronic kidney disease, without long-term current use of insulin (HCC)   3. Age-related osteoporosis without fracture      Plan  GERD: Hold Fosamax for 1 month.  Start PPI, omeprazole 20 mg daily.  She will send me a message regarding which medication she has at home.  Recommend treatment for 6 to 12 weeks and can consider chronic use if needed.  No red flag symptoms.  She will follow-up with me if not improving. Diabetes is currently marginally controlled.  Discussed goal of A1c less than 7.0.  Would like it closer to 6.5.  Discussed  complications with uncontrolled diabetes.  Recommend starting metformin in addition to Trulicity and Jardiance but patient declines.  She will work on diet and increase exercise. Osteoporosis: Due for bone density.  Scheduled for next year.  If stable, will offer drug holiday  Follow up: As scheduled in January No orders of the defined types were placed in this encounter.  No orders of the defined types were placed in this encounter.     Immunization History  Administered Date(s) Administered   Fluad Quad(high Dose 65+) 09/08/2018, 11/12/2019, 10/17/2020, 10/30/2021   Fluad Trivalent(High Dose 65+) 10/11/2022   PFIZER(Purple Top)SARS-COV-2 Vaccination 02/11/2019, 03/04/2019, 10/15/2019   PNEUMOCOCCAL CONJUGATE-20 12/18/2020   Pfizer Covid-19 Vaccine Bivalent Booster 16yrs & up 11/18/2020   Tdap 04/14/2011   Zoster Recombinant(Shingrix) 12/18/2020, 02/04/2021   Zoster, Live 04/14/2011    Diabetes Related Lab Review: Lab Results  Component Value Date   HGBA1C 7.2 (A) 10/11/2022   HGBA1C 7.7 (A) 07/07/2022   HGBA1C 7.6 (H) 01/06/2022    Lab Results  Component Value Date   MICROALBUR <0.7 01/06/2022   Lab Results  Component Value Date   CREATININE 1.45 (H) 10/11/2022   BUN 28 (H) 10/11/2022   NA 138 10/11/2022   K 4.4 10/11/2022   CL 102 10/11/2022   CO2 26 10/11/2022   Lab Results  Component Value Date   CHOL 126 10/11/2022   CHOL 175 07/07/2022  CHOL 212 (H) 01/06/2022   Lab Results  Component Value Date   HDL 52.40 10/11/2022   HDL 51.60 07/07/2022   HDL 66.10 01/06/2022   Lab Results  Component Value Date   LDLCALC 51 10/11/2022   LDLCALC 99 07/07/2022   LDLCALC 120 (H) 01/06/2022   Lab Results  Component Value Date   TRIG 113.0 10/11/2022   TRIG 123.0 07/07/2022   TRIG 129.0 01/06/2022   Lab Results  Component Value Date   CHOLHDL 2 10/11/2022   CHOLHDL 3 07/07/2022   CHOLHDL 3 01/06/2022   Lab Results  Component Value Date   LDLDIRECT 134.0  01/02/2020   The ASCVD Risk score (Arnett DK, et al., 2019) failed to calculate for the following reasons:   The valid total cholesterol range is 130 to 320 mg/dL I have reviewed the PMH, Fam and Soc history. Patient Active Problem List   Diagnosis Date Noted Date Diagnosed   Class 2 severe obesity due to excess calories with serious comorbidity and body mass index (BMI) of 35.0 to 35.9 in adult [facility]) 03/10/2021     Priority: High   Diabetic peripheral neuropathy associated with type 2 diabetes mellitus (HCC) 12/18/2020     Priority: High   Cardiac pacemaker in situ 03/19/2019     Priority: High    Added automatically from request for surgery [id number]    CKD (chronic kidney disease) stage 3, GFR 30-59 ml/min (HCC) 10/04/2016     Priority: High   NSVT (nonsustained ventricular tachycardia) 12/05/2015     Priority: High    Noted on PPM.  Echo on 08/2015 EF normal     History of complete heart block 10/04/2012     Priority: High    Overview:  Dr. Mayme Genta, S/p pacemaker, stable 2014/cla No ischemia by stress testing 2011    Combined hyperlipidemia associated with type 2 diabetes mellitus (HCC) 11/04/2011     Priority: High   Type 2 diabetes mellitus with stage 3b chronic kidney disease, without long-term current use of insulin (HCC) 11/04/2011     Priority: High    Jardiance, trulicity. Tolerates metformin    Hypertension associated with diabetes (HCC) 01/11/2011     Priority: High   Spondylosis of lumbar region without myelopathy or radiculopathy 07/14/2020     Priority: Medium    Obesity (BMI 30-39.9) 01/12/2016     Priority: Medium    Primary localized osteoarthrosis of left lower leg 07/16/2014     Priority: Medium     Talar dome, toe    Age-related osteoporosis without fracture 06/01/2011     Priority: Medium     Dexa 2022: T = -1.6 lowest, improved on fosamax x 3 years. Continue x 2 more years and recheck dexa 2024. Drug holiday if stable at that time. T = -2.6 04/2017  in L hip. New dx: started fosamax 06/2017 T = -1.2 05/2011; recheck at age 8.    Hyponatremia - HCTZ 07/15/2020     Priority: Low   Vitamin D deficiency 04/16/2011     Priority: Low   Peripheral vascular disease with claudication (HCC) 01/06/2022    Spinal stenosis of lumbar region with neurogenic claudication 01/06/2022     Social History: Patient  reports that she has never smoked. She has never used smokeless tobacco. She reports that she does not drink alcohol and does not use drugs.  Review of Systems: Ophthalmic: negative for eye pain, loss of vision or double vision Cardiovascular: negative for chest pain  Respiratory: negative for SOB or persistent cough Gastrointestinal: negative for abdominal pain Genitourinary: negative for dysuria or gross hematuria MSK: negative for foot lesions Neurologic: negative for weakness or gait disturbance  Objective  Vitals: BP 120/74   Pulse 68   Temp 98.7 F (37.1 C)   Ht 5\' 2"  (1.575 m)   Wt 197 lb 12.8 oz (89.7 kg)   SpO2 97%   BMI 36.18 kg/m  General: well appearing, no acute distress  Psych:  Alert and oriented, normal mood and affect HEENT:  Normocephalic, atraumatic, moist mucous membranes, supple neck  Cardiovascular:  Nl S1 and S2, RRR without murmur, gallop or rub. no edema Respiratory:  Good breath sounds bilaterally, CTAB with normal effort, no rales Gastrointestinal: normal BS, soft, nontender, no midepigastric tenderness.  No apparent splenomegaly   Diabetic education: ongoing education regarding chronic disease management for diabetes was given today. We continue to reinforce the ABC's of diabetic management: A1c (<7 or 8 dependent upon patient), tight blood pressure control, and cholesterol management with goal LDL < 100 minimally. We discuss diet strategies, exercise recommendations, medication options and possible side effects. At each visit, we review recommended immunizations and preventive care recommendations for  diabetics and stress that good diabetic control can prevent other problems. See below for this patient's data.   Commons side effects, risks, benefits, and alternatives for medications and treatment plan prescribed today were discussed, and the patient expressed understanding of the given instructions. Patient is instructed to call or message via MyChart if he/she has any questions or concerns regarding our treatment plan. No barriers to understanding were identified. We discussed Red Flag symptoms and signs in detail. Patient expressed understanding regarding what to do in case of urgent or emergency type symptoms.  Medication list was reconciled, printed and provided to the patient in AVS. Patient instructions and summary information was reviewed with the patient as documented in the AVS. This note was prepared with assistance of Dragon voice recognition software. Occasional wrong-word or sound-a-like substitutions may have occurred due to the inherent limitations of voice recognition software

## 2022-11-16 DIAGNOSIS — M5451 Vertebrogenic low back pain: Secondary | ICD-10-CM | POA: Diagnosis not present

## 2022-11-16 DIAGNOSIS — M792 Neuralgia and neuritis, unspecified: Secondary | ICD-10-CM | POA: Diagnosis not present

## 2022-11-16 DIAGNOSIS — M48062 Spinal stenosis, lumbar region with neurogenic claudication: Secondary | ICD-10-CM | POA: Diagnosis not present

## 2022-11-22 ENCOUNTER — Ambulatory Visit (INDEPENDENT_AMBULATORY_CARE_PROVIDER_SITE_OTHER): Payer: Medicare PPO | Admitting: Family Medicine

## 2022-11-22 ENCOUNTER — Encounter: Payer: Self-pay | Admitting: Family Medicine

## 2022-11-22 VITALS — BP 110/66 | HR 102 | Temp 97.7°F | Ht 62.0 in | Wt 197.6 lb

## 2022-11-22 DIAGNOSIS — E1159 Type 2 diabetes mellitus with other circulatory complications: Secondary | ICD-10-CM

## 2022-11-22 DIAGNOSIS — K219 Gastro-esophageal reflux disease without esophagitis: Secondary | ICD-10-CM | POA: Diagnosis not present

## 2022-11-22 DIAGNOSIS — R000 Tachycardia, unspecified: Secondary | ICD-10-CM

## 2022-11-22 DIAGNOSIS — R109 Unspecified abdominal pain: Secondary | ICD-10-CM | POA: Diagnosis not present

## 2022-11-22 DIAGNOSIS — I152 Hypertension secondary to endocrine disorders: Secondary | ICD-10-CM | POA: Diagnosis not present

## 2022-11-22 NOTE — Progress Notes (Signed)
Subjective  CC:  Chief Complaint  Patient presents with   Flank Pain    Pt stated that she has been experiencing some Rt flak pain for the past week. It has gotten better but it is still there     HPI: Amy Bartlett is a 72 y.o. female who presents to the office today to address the problems listed above in the chief complaint. Discussed the use of AI scribe software for clinical note transcription with the patient, who gave verbal consent to proceed.  History of Present Illness   The patient presented with bilateral flank pain that started a week ago, which they initially attributed to a recent exercise session involving weights. The pain was described as sore and achy, more prominent on the right side and radiating towards the back. The patient attempted to manage the pain with lidocaine patches, which seemed to alleviate the pain on the left side but not on the right. The pain was noted to sometimes radiate down the leg. The patient also reported a sensation of feeling faint at times, which they associated with the pain.  The patient has a history of lumbar stenosis, but they reported that this current pain feels different from their usual back pain. They also reported increased urinary frequency, with a need to urinate approximately every three hours. The patient has not been monitoring their blood sugar levels regularly and only checks when they feel unwell.  The patient denied any abdominal pain, nausea, diarrhea, or constipation. They reported having two bowel movements on the day of the consultation. They also reported occasional chills but did not measure their temperature to check for fever. The patient has been taking Pepcid for reflux, which they reported has been effective in managing their symptoms.  The patient's blood pressure was measured at 110/60, which they reported as being lower than their usual readings. They expressed concern about feeling lightheaded, which  they associated with their lower blood pressure. The patient denied taking any new medications or experiencing any changes in their existing medications.      Assessment  1. Bilateral flank pain   2. Tachycardia   3. Hypertension associated with diabetes (HCC)   4. Gastroesophageal reflux disease, unspecified whether esophagitis present      Plan  Assessment and Plan    Musculoskeletal Pain Intermittent bilateral side pain, more pronounced on the right, radiating to the shoulder and occasionally the leg. Acute onset last Monday, possibly related to recent weight exercise. Pain is sore and achy, relieved by lidocaine patches and heat. No significant tenderness on exam, improves when lying down. Differential includes muscle strain versus other musculoskeletal causes. Unlikely renal or internal organ-related due to lack of urinary symptoms, fever, or significant tenderness. Discussed anti-inflammatory options, including diclofenac, Motrin, and Aleve, with potential gastrointestinal side effects. - Order urinalysis to rule out renal issues - Recommend over-the-counter anti-inflammatory medications such as Motrin or Aleve - Advise to use Motrin or Aleve as directed - Follow up if symptoms do not improve or worsen - check labs  Hypertension Blood pressure at 110/60, within normal range. Reports lightheadedness, inconsistent with usual readings. Recent higher readings noted, but current reading is ideal. Discussed that 110/60 is generally not concerning unless symptomatic. Will monitor and adjust medication if lightheadedness persists. - Monitor blood pressure and adjust medication if lightheadedness persists  Gastroesophageal Reflux Disease (GERD) Taking Pepcid for reflux, effective with no current issues. Discussed that Pepcid is less potent than Prilosec but managing symptoms  adequately. - Continue current regimen with Pepcid  General Health Maintenance Received flu shot recently, followed  by a runny nose. No other significant issues discussed. - Monitor for any prolonged or severe symptoms post-vaccination  Follow-up - Follow up if symptoms do not improve or worsen - Return to clinic if new symptoms develop or persist.        Follow up: as scheduled  01/12/2023  Orders Placed This Encounter  Procedures   Urine Culture   CBC with Differential/Platelet   Urinalysis, Routine w reflex microscopic   Comprehensive metabolic panel   D-dimer, quantitative   No orders of the defined types were placed in this encounter.     I reviewed the patients updated PMH, FH, and SocHx.    Patient Active Problem List   Diagnosis Date Noted   Class 2 severe obesity due to excess calories with serious comorbidity and body mass index (BMI) of 35.0 to 35.9 in adult [facility]) 03/10/2021    Priority: High   Diabetic peripheral neuropathy associated with type 2 diabetes mellitus (HCC) 12/18/2020    Priority: High   Cardiac pacemaker in situ 03/19/2019    Priority: High   CKD (chronic kidney disease) stage 3, GFR 30-59 ml/min (HCC) 10/04/2016    Priority: High   NSVT (nonsustained ventricular tachycardia) 12/05/2015    Priority: High   History of complete heart block 10/04/2012    Priority: High   Combined hyperlipidemia associated with type 2 diabetes mellitus (HCC) 11/04/2011    Priority: High   Type 2 diabetes mellitus with stage 3b chronic kidney disease, without long-term current use of insulin (HCC) 11/04/2011    Priority: High   Hypertension associated with diabetes (HCC) 01/11/2011    Priority: High   Spondylosis of lumbar region without myelopathy or radiculopathy 07/14/2020    Priority: Medium    Obesity (BMI 30-39.9) 01/12/2016    Priority: Medium    Primary localized osteoarthrosis of left lower leg 07/16/2014    Priority: Medium    Age-related osteoporosis without fracture 06/01/2011    Priority: Medium    Hyponatremia - HCTZ 07/15/2020    Priority: Low   Vitamin D  deficiency 04/16/2011    Priority: Low   Peripheral vascular disease with claudication (HCC) 01/06/2022   Spinal stenosis of lumbar region with neurogenic claudication 01/06/2022   Current Meds  Medication Sig   ACCU-CHEK GUIDE test strip USE TO TEST UP TO 4 TIMES A DAY AS DIRECTED   alendronate (FOSAMAX) 70 MG tablet TAKE 1 TABLET BY MOUTH EVERY 7 DAYS WITH A FULL GLASS OF WATER ON AN EMPTY STOMACH   aspirin 81 MG EC tablet Take by mouth.   blood glucose meter kit and supplies Dispense based on patient and insurance preference. Use up to four times daily as directed. (FOR ICD-10 E10.9, E11.9).   Cholecalciferol 125 MCG (5000 UT) TABS Take by mouth.   diclofenac Sodium (VOLTAREN) 1 % GEL Apply 4 g topically 4 (four) times daily as needed.   Dulaglutide (TRULICITY) 3 MG/0.5ML SOPN Inject 3 mg as directed once a week.   empagliflozin (JARDIANCE) 25 MG TABS tablet Take 1 tablet (25 mg total) by mouth daily.   fluticasone (FLONASE) 50 MCG/ACT nasal spray Place 2 sprays into both nostrils daily.   lidocaine (LIDODERM) 5 % SMARTSIG:Patch(s) Topical   lisinopril-hydrochlorothiazide (ZESTORETIC) 20-12.5 MG tablet Take 1 tablet by mouth daily.   loratadine (CLARITIN) 10 MG tablet Take 1 tablet (10 mg total) by mouth daily.  rosuvastatin (CRESTOR) 20 MG tablet Take 1 tablet (20 mg total) by mouth at bedtime.    Allergies: Patient has No Known Allergies. Family History: Patient family history includes Arthritis in her father; Diabetes in her brother; Glaucoma in her mother; Healthy in her daughter and daughter; Heart disease in her mother; Hypertension in her mother; Kidney failure in her brother; Osteoporosis in her mother; Pneumonia in her father; Stroke in her brother. Social History:  Patient  reports that she has never smoked. She has never used smokeless tobacco. She reports that she does not drink alcohol and does not use drugs.  Review of Systems: Constitutional: Negative for fever  malaise or anorexia Cardiovascular: negative for chest pain Respiratory: negative for SOB or persistent cough Gastrointestinal: negative for abdominal pain  Objective  Vitals: BP 110/66   Pulse (!) 102   Temp 97.7 F (36.5 C)   Ht 5\' 2"  (1.575 m)   Wt 197 lb 9.6 oz (89.6 kg)   SpO2 94%   BMI 36.14 kg/m  General: no acute distress , A&Ox3 Back: L>R flank ttp w/o mass, no midline ttp, FROM   Commons side effects, risks, benefits, and alternatives for medications and treatment plan prescribed today were discussed, and the patient expressed understanding of the given instructions. Patient is instructed to call or message via MyChart if he/she has any questions or concerns regarding our treatment plan. No barriers to understanding were identified. We discussed Red Flag symptoms and signs in detail. Patient expressed understanding regarding what to do in case of urgent or emergency type symptoms.  Medication list was reconciled, printed and provided to the patient in AVS. Patient instructions and summary information was reviewed with the patient as documented in the AVS. This note was prepared with assistance of Dragon voice recognition software. Occasional wrong-word or sound-a-like substitutions may have occurred due to the inherent limitations of voice recognition software

## 2022-11-23 LAB — URINALYSIS, ROUTINE W REFLEX MICROSCOPIC
Bilirubin Urine: NEGATIVE
Hgb urine dipstick: NEGATIVE
Ketones, ur: NEGATIVE
Leukocytes,Ua: NEGATIVE
Nitrite: NEGATIVE
RBC / HPF: NONE SEEN (ref 0–?)
Specific Gravity, Urine: 1.01 (ref 1.000–1.030)
Total Protein, Urine: NEGATIVE
Urine Glucose: >=1000 — AB
Urobilinogen, UA: 0.2 (ref 0.0–1.0)
WBC, UA: NONE SEEN (ref 0–?)
pH: 6.5 (ref 5.0–8.0)

## 2022-11-23 LAB — CBC WITH DIFFERENTIAL/PLATELET
Basophils Absolute: 0.1 10*3/uL (ref 0.0–0.1)
Basophils Relative: 1.4 % (ref 0.0–3.0)
Eosinophils Absolute: 0.1 10*3/uL (ref 0.0–0.7)
Eosinophils Relative: 2.3 % (ref 0.0–5.0)
HCT: 41.3 % (ref 36.0–46.0)
Hemoglobin: 13.4 g/dL (ref 12.0–15.0)
Lymphocytes Relative: 42.6 % (ref 12.0–46.0)
Lymphs Abs: 1.6 10*3/uL (ref 0.7–4.0)
MCHC: 32.6 g/dL (ref 30.0–36.0)
MCV: 83.1 fL (ref 78.0–100.0)
Monocytes Absolute: 0.4 10*3/uL (ref 0.1–1.0)
Monocytes Relative: 9.5 % (ref 3.0–12.0)
Neutro Abs: 1.6 10*3/uL (ref 1.4–7.7)
Neutrophils Relative %: 44.2 % (ref 43.0–77.0)
Platelets: 251 10*3/uL (ref 150.0–400.0)
RBC: 4.96 Mil/uL (ref 3.87–5.11)
RDW: 16.7 % — ABNORMAL HIGH (ref 11.5–15.5)
WBC: 3.7 10*3/uL — ABNORMAL LOW (ref 4.0–10.5)

## 2022-11-23 LAB — COMPREHENSIVE METABOLIC PANEL
ALT: 17 U/L (ref 0–35)
AST: 20 U/L (ref 0–37)
Albumin: 4.5 g/dL (ref 3.5–5.2)
Alkaline Phosphatase: 45 U/L (ref 39–117)
BUN: 29 mg/dL — ABNORMAL HIGH (ref 6–23)
CO2: 26 meq/L (ref 19–32)
Calcium: 9.9 mg/dL (ref 8.4–10.5)
Chloride: 101 meq/L (ref 96–112)
Creatinine, Ser: 1.5 mg/dL — ABNORMAL HIGH (ref 0.40–1.20)
GFR: 34.77 mL/min — ABNORMAL LOW (ref >60.00)
Glucose, Bld: 94 mg/dL (ref 70–99)
Potassium: 4.4 meq/L (ref 3.5–5.1)
Sodium: 137 meq/L (ref 135–145)
Total Bilirubin: 0.4 mg/dL (ref 0.2–1.2)
Total Protein: 8.2 g/dL (ref 6.0–8.3)

## 2022-11-23 LAB — URINE CULTURE
MICRO NUMBER:: 15743896
Result:: NO GROWTH
SPECIMEN QUALITY:: ADEQUATE

## 2022-11-23 LAB — D-DIMER, QUANTITATIVE: D-Dimer, Quant: 0.56 ug{FEU}/mL — ABNORMAL HIGH (ref <0.50)

## 2022-11-25 NOTE — Progress Notes (Signed)
See mychart note Dear Amy Bartlett, All of your lab results are stable.  Please let me know if your pain does not improve or if it worsens, you develop shortness of breath or pain with deep breathing. I believe this is pain coming from your back muscles but if it does not get better I need to look into other things.  Let me know if you have any questions. Sincerely, Dr. Mardelle Matte

## 2022-12-11 ENCOUNTER — Other Ambulatory Visit: Payer: Self-pay | Admitting: Family Medicine

## 2022-12-17 ENCOUNTER — Other Ambulatory Visit: Payer: Self-pay | Admitting: Family Medicine

## 2022-12-17 DIAGNOSIS — E1122 Type 2 diabetes mellitus with diabetic chronic kidney disease: Secondary | ICD-10-CM

## 2022-12-21 ENCOUNTER — Ambulatory Visit (INDEPENDENT_AMBULATORY_CARE_PROVIDER_SITE_OTHER): Payer: Medicare PPO | Admitting: Physician Assistant

## 2022-12-21 ENCOUNTER — Encounter: Payer: Self-pay | Admitting: Physician Assistant

## 2022-12-21 VITALS — BP 120/76 | HR 90 | Temp 97.5°F | Ht 62.0 in | Wt 200.2 lb

## 2022-12-21 DIAGNOSIS — J0100 Acute maxillary sinusitis, unspecified: Secondary | ICD-10-CM

## 2022-12-21 DIAGNOSIS — R5383 Other fatigue: Secondary | ICD-10-CM | POA: Diagnosis not present

## 2022-12-21 LAB — POCT INFLUENZA A/B
Influenza A, POC: NEGATIVE
Influenza B, POC: NEGATIVE

## 2022-12-21 LAB — POC COVID19 BINAXNOW: SARS Coronavirus 2 Ag: NEGATIVE

## 2022-12-21 MED ORDER — AMOXICILLIN-POT CLAVULANATE 875-125 MG PO TABS: 1.0000 | ORAL_TABLET | Freq: Two times a day (BID) | ORAL | 0 refills | Status: AC

## 2022-12-21 NOTE — Patient Instructions (Addendum)
-  UPPER RESPIRATORY / SINUS SYMPTOMS: You have been experiencing congestion and difficulty coughing up mucus, which is likely due to a viral upper respiratory infection. However, given the duration and severity of your symptoms, bacterial sinusitis is also a possibility. We will start you on Augmentin, which you should take with food. Additionally, using nasal saline (AYR) and Breathe Right strips can help relieve your symptoms.  Some other things that can make you feel better are: - Increased rest - Increasing fluid with water/sugar free electrolytes - Acetaminophen and ibuprofen as needed for fever/pain - Salt water gargling, chloraseptic spray and throat lozenges for sore throat - OTC guaifenesin (Mucinex) 600 mg twice daily for congestion - Saline sinus flushes or a neti pot - Humidifying the air

## 2022-12-21 NOTE — Progress Notes (Signed)
Patient ID: Amy Bartlett, female    DOB: July 08, 1950, 73 y.o.   MRN: [id number]   Assessment & Plan:  Acute non-recurrent maxillary sinusitis -     POC COVID-19 BinaxNow -     POCT Influenza A/B  Other fatigue -     POC COVID-19 BinaxNow -     POCT Influenza A/B  Other orders -     Amoxicillin-Pot Clavulanate; Take 1 tablet by mouth 2 (two) times daily for 7 days. Take with food.  Dispense: 14 tablet; Refill: 0    Assessment and Plan    Upper Respiratory Symptoms Persistent symptoms of congestion and difficulty coughing up mucus. No signs of lower respiratory involvement or systemic symptoms. Likely viral upper respiratory infection, but given duration and severity of symptoms, bacterial sinusitis is a consideration. -Start Augmentin with food. -Advise use of nasal saline and Breathe Right strips for symptomatic relief.  -Flu and COVID status normal. -Advise patient to rest and take care of self, especially given upcoming travel and caregiving responsibilities.         Return if symptoms worsen or fail to improve.    Subjective:    Chief Complaint  Patient presents with   Cough    Pt in office c/o cough, chills and body aches since Friday; pt got flu shot in office and pt states since not been feeling well; Pt taking TheraFlu OTC but feels like she is getting worse and not getting better. When coughing up mucus it is [place]/yellow.     Cough   Discussed the use of AI scribe software for clinical note transcription with the patient, who gave verbal consent to proceed.  History of Present Illness   The patient, with a history of acid reflux managed with omeprazole, presents with a week-long history of general malaise and throat discomfort. She describes a sensation of 'something in my throat' that she is unable to cough up, but denies chest discomfort or shortness of breath. She also reports intermittent aches and pains that wake her from sleep. She denies  recent travel but notes that her husband, an Biomedical scientist, has been experiencing allergy symptoms. She has been managing her symptoms at home with Allertec, Aleve, and Theraflu. She also has a history of arthritis, which may be contributing to her symptoms.       Past Medical History:  Diagnosis Date   Arthritis    Chicken pox    Diabetes mellitus (HCC)    Hyperlipidemia    Hypertension    Hyponatremia - HCTZ 07/15/2020   Osteopenia     Past Surgical History:  Procedure Laterality Date   CARDIAC PACEMAKER PLACEMENT     CESAREAN SECTION  1974, 1977, 1979   REDUCTION MAMMAPLASTY Bilateral 2009   TUBAL LIGATION      Family History  Problem Relation Age of Onset   Glaucoma Mother    Heart disease Mother        Had Pacemaker, Died on CHF    Hypertension Mother    Osteoporosis Mother    Pneumonia Father    Arthritis Father    Diabetes Brother    Stroke Brother    Kidney failure Brother    Healthy Daughter    Healthy Daughter     Social History   Tobacco Use   Smoking status: Never   Smokeless tobacco: Never  Vaping Use   Vaping status: Never Used  Substance Use Topics   Alcohol use: Never  Drug use: Never     No Known Allergies  Review of Systems  Respiratory:  Positive for cough.    NEGATIVE UNLESS OTHERWISE INDICATED IN HPI      Objective:     BP 120/76 (BP Location: Left Arm, Patient Position: Sitting, Cuff Size: Normal)   Pulse 90   Temp (!) 97.5 F (36.4 C) (Temporal)   Ht 5\' 2"  (1.575 m)   Wt 200 lb 3.2 oz (90.8 kg)   SpO2 97%   BMI 36.62 kg/m   Wt Readings from Last 3 Encounters:  12/21/22 200 lb 3.2 oz (90.8 kg)  11/22/22 197 lb 9.6 oz (89.6 kg)  11/05/22 197 lb 12.8 oz (89.7 kg)    BP Readings from Last 3 Encounters:  12/21/22 120/76  11/22/22 110/66  11/05/22 120/74     Physical Exam Vitals and nursing note reviewed.  Constitutional:      General: She is not in acute distress.    Appearance: Normal appearance. She is not  ill-appearing.  HENT:     Head: Normocephalic.     Right Ear: External ear normal. There is impacted cerumen.     Left Ear: External ear normal. There is impacted cerumen.     Nose: Congestion present.     Right Turbinates: Enlarged.     Left Turbinates: Enlarged.     Right Sinus: Maxillary sinus tenderness present.     Left Sinus: Maxillary sinus tenderness present.     Mouth/Throat:     Mouth: Mucous membranes are moist.     Pharynx: No oropharyngeal exudate or posterior oropharyngeal erythema.  Eyes:     Extraocular Movements: Extraocular movements intact.     Conjunctiva/sclera: Conjunctivae normal.     Pupils: Pupils are equal, round, and reactive to light.  Cardiovascular:     Rate and Rhythm: Normal rate and regular rhythm.     Pulses: Normal pulses.     Heart sounds: Normal heart sounds. No murmur heard. Pulmonary:     Effort: Pulmonary effort is normal. No respiratory distress.     Breath sounds: Normal breath sounds. No wheezing.  Musculoskeletal:     Cervical back: Normal range of motion.  Skin:    General: Skin is warm.  Neurological:     Mental Status: She is alert and oriented to person, place, and time.  Psychiatric:        Mood and Affect: Mood normal.        Behavior: Behavior normal.              Kacey Dysert M Kaitelyn Jamison, PA-C

## 2022-12-22 ENCOUNTER — Other Ambulatory Visit: Payer: Self-pay | Admitting: Family Medicine

## 2022-12-23 ENCOUNTER — Other Ambulatory Visit: Payer: Self-pay

## 2022-12-23 ENCOUNTER — Telehealth: Payer: Self-pay

## 2022-12-23 MED ORDER — LISINOPRIL-HYDROCHLOROTHIAZIDE 20-12.5 MG PO TABS: 1.0000 | ORAL_TABLET | Freq: Every day | ORAL | 3 refills | Status: AC

## 2022-12-23 NOTE — Telephone Encounter (Signed)
Copied from CRM [id number]. Topic: Clinical - Medication Refill >> Dec 22, 2022  5:12 PM Cassiday T wrote: Most Recent Primary Care Visit:  Provider: Bary Leriche  Department: [facility]  Visit Type: OFFICE VISIT  Date: 12/21/2022  Medication: lisinopril-hydrochlorothiazide (ZESTORETIC) 20-12.5 MG tablet [[id number]]  Has the patient contacted their pharmacy? Yes (Agent: If no, request that the patient contact the pharmacy for the refill. If patient does not wish to contact the pharmacy document the reason why and proceed with request.) (Agent: If yes, when and what did the pharmacy advise?)  Is this the correct pharmacy for this prescription? Yes If no, delete pharmacy and type the correct one.  This is the patient's preferred pharmacy:  [facility] [address] [place] - [address] [place] [address] Phone: [phone] Fax: [phone]   Has the prescription been filled recently? Yes  Is the patient out of the medication? Yes  Has the patient been seen for an appointment in the last year OR does the patient have an upcoming appointment? Yes  Can we respond through MyChart? No  Agent: Please be advised that Rx refills may take up to 3 business days. We ask that you follow-up with your pharmacy.

## 2023-01-12 ENCOUNTER — Encounter: Payer: Medicare PPO | Admitting: Family Medicine

## 2023-03-04 DIAGNOSIS — M545 Low back pain, unspecified: Secondary | ICD-10-CM | POA: Diagnosis not present

## 2023-03-04 DIAGNOSIS — M4056 Lordosis, unspecified, lumbar region: Secondary | ICD-10-CM | POA: Diagnosis not present

## 2023-03-04 DIAGNOSIS — M4316 Spondylolisthesis, lumbar region: Secondary | ICD-10-CM | POA: Diagnosis not present

## 2023-03-09 DIAGNOSIS — M4720 Other spondylosis with radiculopathy, site unspecified: Secondary | ICD-10-CM | POA: Diagnosis not present

## 2023-03-11 ENCOUNTER — Other Ambulatory Visit: Payer: Medicare PPO

## 2023-03-30 DIAGNOSIS — M47812 Spondylosis without myelopathy or radiculopathy, cervical region: Secondary | ICD-10-CM | POA: Diagnosis not present

## 2023-03-30 DIAGNOSIS — M4802 Spinal stenosis, cervical region: Secondary | ICD-10-CM | POA: Diagnosis not present

## 2023-03-30 DIAGNOSIS — M50222 Other cervical disc displacement at C5-C6 level: Secondary | ICD-10-CM | POA: Diagnosis not present

## 2023-03-30 DIAGNOSIS — M47816 Spondylosis without myelopathy or radiculopathy, lumbar region: Secondary | ICD-10-CM | POA: Diagnosis not present

## 2023-03-30 DIAGNOSIS — M51369 Other intervertebral disc degeneration, lumbar region without mention of lumbar back pain or lower extremity pain: Secondary | ICD-10-CM | POA: Diagnosis not present

## 2023-03-30 DIAGNOSIS — M50221 Other cervical disc displacement at C4-C5 level: Secondary | ICD-10-CM | POA: Diagnosis not present

## 2023-03-30 DIAGNOSIS — M4720 Other spondylosis with radiculopathy, site unspecified: Secondary | ICD-10-CM | POA: Diagnosis not present

## 2023-04-01 ENCOUNTER — Telehealth: Payer: Self-pay

## 2023-04-01 ENCOUNTER — Other Ambulatory Visit: Payer: Self-pay | Admitting: Family Medicine

## 2023-04-01 ENCOUNTER — Other Ambulatory Visit: Payer: Self-pay

## 2023-04-01 DIAGNOSIS — Z1231 Encounter for screening mammogram for malignant neoplasm of breast: Secondary | ICD-10-CM

## 2023-04-01 DIAGNOSIS — M545 Low back pain, unspecified: Secondary | ICD-10-CM | POA: Diagnosis not present

## 2023-04-01 DIAGNOSIS — Z13820 Encounter for screening for osteoporosis: Secondary | ICD-10-CM

## 2023-04-01 DIAGNOSIS — M4726 Other spondylosis with radiculopathy, lumbar region: Secondary | ICD-10-CM | POA: Diagnosis not present

## 2023-04-01 NOTE — Telephone Encounter (Signed)
 Copied from CRM [id number]. Topic: Clinical - Medication Refill >> Apr 01, 2023  3:08 PM Truddie Crumble wrote: Most Recent Primary Care Visit:  Provider: Bary Leriche  Department: [facility]  Visit Type: OFFICE VISIT  Date: 12/21/2022  Medication: alendronate (FOSAMAX) 70 MG tablet (the last time the patient saw the doctor on 12/17 she wanted her to stop taking this medication)  Has the patient contacted their pharmacy? Yes (Agent: If no, request that the patient contact the pharmacy for the refill. If patient does not wish to contact the pharmacy document the reason why and proceed with request.) (Agent: If yes, when and what did the pharmacy advise?)  Is this the correct pharmacy for this prescription? Yes If no, delete pharmacy and type the correct one.  This is the patient's preferred pharmacy:   [facility] [phone] - [place], [place] - [address] [place] [address] Phone: [phone] Fax: [phone]  Has the prescription been filled recently? No  Is the patient out of the medication? Yes  Has the patient been seen for an appointment in the last year OR does the patient have an upcoming appointment? Yes  Can we respond through MyChart? Yes  Agent: Please be advised that Rx refills may take up to 3 business days. We ask that you follow-up with your pharmacy.

## 2023-04-01 NOTE — Telephone Encounter (Signed)
 Copied from CRM [id number]. Topic: Appointments - Appointment Scheduling >> Apr 01, 2023  2:32 PM Truddie Crumble wrote: Patient/patient representative is calling to schedule an appointment. Refer to attachments for appointment information.   Patient called stating she need a bone density test done in the next month. Patient wants the bone density test done in [place] or [place]

## 2023-04-04 MED ORDER — ALENDRONATE SODIUM 70 MG PO TABS: ORAL_TABLET | ORAL | 0 refills | Status: DC

## 2023-04-04 NOTE — Telephone Encounter (Signed)
 Patient   would like to pickup this prescription at [facility], [place], [place]  LOV  11/22/2022

## 2023-04-08 ENCOUNTER — Telehealth: Payer: Self-pay | Admitting: Family Medicine

## 2023-04-08 ENCOUNTER — Telehealth: Payer: Self-pay

## 2023-04-08 ENCOUNTER — Other Ambulatory Visit: Payer: Self-pay

## 2023-04-08 DIAGNOSIS — N1832 Chronic kidney disease, stage 3b: Secondary | ICD-10-CM

## 2023-04-08 MED ORDER — TRULICITY 3 MG/0.5ML ~~LOC~~ SOAJ: 3.0000 mg | SUBCUTANEOUS | 3 refills | Status: DC

## 2023-04-08 NOTE — Telephone Encounter (Signed)
 Copied from CRM [id number]. Topic: Clinical - Request for Lab/Test Order >> Apr 07, 2023  4:13 PM Desma Mcgregor wrote: Reason for CRM: Patient has a bone density scan ordered, but scheduling for [facility] starts in June. Patient wants it done sooner than that and wants it done while in [place], [place] while she is there. Patient is asking if the doctor can send a new order/referral to a location near [place], [place] [address] or within a 25 mile radius?  Pt will be in [place] until the 15th of April. Please follow up asap.  Please Advise.  Message has been sent to provider to address

## 2023-04-08 NOTE — Telephone Encounter (Unsigned)
 Copied from CRM [id number]. Topic: Clinical - Medication Question >> Apr 08, 2023  3:41 PM Shereese L wrote: Reason for EAV:[id number] calling to find out the pharmacy diagnosis code for medication Dulaglutide (TRULICITY) 3 MG/0.5ML SOPN  Was advised to send a message to clinical so that the doctor can call the pharmacy back

## 2023-04-11 ENCOUNTER — Telehealth: Payer: Self-pay

## 2023-04-11 ENCOUNTER — Other Ambulatory Visit: Payer: Self-pay

## 2023-04-11 DIAGNOSIS — Z1231 Encounter for screening mammogram for malignant neoplasm of breast: Secondary | ICD-10-CM

## 2023-04-11 DIAGNOSIS — M818 Other osteoporosis without current pathological fracture: Secondary | ICD-10-CM

## 2023-04-11 NOTE — Telephone Encounter (Signed)
 Copied from CRM [id number]. Topic: Clinical - Medical Advice >> Apr 11, 2023  9:55 AM Carlatta H wrote: Reason for CRM: Patient will be in [place] and would like bone density testing and mammogram done there// The referral for bone density can be faxed [facility] [phone] Patient mammogram will also be done at the same facility// Please let patient know via mychart that referral has been sent//

## 2023-04-13 DIAGNOSIS — Z1231 Encounter for screening mammogram for malignant neoplasm of breast: Secondary | ICD-10-CM | POA: Diagnosis not present

## 2023-04-13 LAB — HM MAMMOGRAPHY

## 2023-04-15 DIAGNOSIS — M8589 Other specified disorders of bone density and structure, multiple sites: Secondary | ICD-10-CM | POA: Diagnosis not present

## 2023-04-15 LAB — HM DEXA SCAN

## 2023-04-20 ENCOUNTER — Ambulatory Visit

## 2023-04-22 DIAGNOSIS — Z950 Presence of cardiac pacemaker: Secondary | ICD-10-CM | POA: Diagnosis not present

## 2023-04-22 DIAGNOSIS — I442 Atrioventricular block, complete: Secondary | ICD-10-CM | POA: Diagnosis not present

## 2023-04-26 ENCOUNTER — Ambulatory Visit (INDEPENDENT_AMBULATORY_CARE_PROVIDER_SITE_OTHER): Admitting: Family Medicine

## 2023-04-26 ENCOUNTER — Encounter: Payer: Self-pay | Admitting: Family Medicine

## 2023-04-26 ENCOUNTER — Other Ambulatory Visit: Payer: Self-pay

## 2023-04-26 VITALS — BP 130/78 | HR 86 | Temp 97.7°F | Wt 196.6 lb

## 2023-04-26 DIAGNOSIS — N1831 Chronic kidney disease, stage 3a: Secondary | ICD-10-CM | POA: Diagnosis not present

## 2023-04-26 DIAGNOSIS — E1142 Type 2 diabetes mellitus with diabetic polyneuropathy: Secondary | ICD-10-CM

## 2023-04-26 DIAGNOSIS — E1122 Type 2 diabetes mellitus with diabetic chronic kidney disease: Secondary | ICD-10-CM

## 2023-04-26 DIAGNOSIS — I10 Essential (primary) hypertension: Secondary | ICD-10-CM | POA: Diagnosis not present

## 2023-04-26 DIAGNOSIS — I152 Hypertension secondary to endocrine disorders: Secondary | ICD-10-CM | POA: Diagnosis not present

## 2023-04-26 DIAGNOSIS — E66812 Obesity, class 2: Secondary | ICD-10-CM | POA: Diagnosis not present

## 2023-04-26 DIAGNOSIS — E1169 Type 2 diabetes mellitus with other specified complication: Secondary | ICD-10-CM | POA: Diagnosis not present

## 2023-04-26 DIAGNOSIS — Z01818 Encounter for other preprocedural examination: Secondary | ICD-10-CM

## 2023-04-26 DIAGNOSIS — N1832 Chronic kidney disease, stage 3b: Secondary | ICD-10-CM

## 2023-04-26 DIAGNOSIS — M818 Other osteoporosis without current pathological fracture: Secondary | ICD-10-CM

## 2023-04-26 DIAGNOSIS — Z6835 Body mass index (BMI) 35.0-35.9, adult: Secondary | ICD-10-CM

## 2023-04-26 DIAGNOSIS — Z780 Asymptomatic menopausal state: Secondary | ICD-10-CM

## 2023-04-26 DIAGNOSIS — I4729 Other ventricular tachycardia: Secondary | ICD-10-CM | POA: Diagnosis not present

## 2023-04-26 DIAGNOSIS — E1159 Type 2 diabetes mellitus with other circulatory complications: Secondary | ICD-10-CM | POA: Diagnosis not present

## 2023-04-26 DIAGNOSIS — E782 Mixed hyperlipidemia: Secondary | ICD-10-CM | POA: Diagnosis not present

## 2023-04-26 DIAGNOSIS — Z01810 Encounter for preprocedural cardiovascular examination: Secondary | ICD-10-CM | POA: Diagnosis not present

## 2023-04-26 DIAGNOSIS — I442 Atrioventricular block, complete: Secondary | ICD-10-CM | POA: Diagnosis not present

## 2023-04-26 LAB — LIPID PANEL
Cholesterol: 161 mg/dL (ref 0–200)
HDL: 60.2 mg/dL (ref >39.00)
LDL Cholesterol: 80 mg/dL (ref 0–99)
NonHDL: 100.35
Total CHOL/HDL Ratio: 3
Triglycerides: 101 mg/dL (ref 0.0–149.0)
VLDL: 20.2 mg/dL (ref 0.0–40.0)

## 2023-04-26 LAB — CBC WITH DIFFERENTIAL/PLATELET
Basophils Absolute: 0 10*3/uL (ref 0.0–0.1)
Basophils Relative: 0.7 % (ref 0.0–3.0)
Eosinophils Absolute: 0.1 10*3/uL (ref 0.0–0.7)
Eosinophils Relative: 1.8 % (ref 0.0–5.0)
HCT: 39.5 % (ref 36.0–46.0)
Hemoglobin: 12.9 g/dL (ref 12.0–15.0)
Lymphocytes Relative: 33.8 % (ref 12.0–46.0)
Lymphs Abs: 1.9 10*3/uL (ref 0.7–4.0)
MCHC: 32.7 g/dL (ref 30.0–36.0)
MCV: 83.3 fl (ref 78.0–100.0)
Monocytes Absolute: 0.5 10*3/uL (ref 0.1–1.0)
Monocytes Relative: 8.9 % (ref 3.0–12.0)
Neutro Abs: 3.1 10*3/uL (ref 1.4–7.7)
Neutrophils Relative %: 54.8 % (ref 43.0–77.0)
Platelets: 231 10*3/uL (ref 150.0–400.0)
RBC: 4.74 Mil/uL (ref 3.87–5.11)
RDW: 16 % — ABNORMAL HIGH (ref 11.5–15.5)
WBC: 5.6 10*3/uL (ref 4.0–10.5)

## 2023-04-26 LAB — COMPREHENSIVE METABOLIC PANEL WITH GFR
ALT: 19 U/L (ref 0–35)
AST: 18 U/L (ref 0–37)
Albumin: 4.3 g/dL (ref 3.5–5.2)
Alkaline Phosphatase: 53 U/L (ref 39–117)
BUN: 34 mg/dL — ABNORMAL HIGH (ref 6–23)
CO2: 28 meq/L (ref 19–32)
Calcium: 9.7 mg/dL (ref 8.4–10.5)
Chloride: 100 meq/L (ref 96–112)
Creatinine, Ser: 1.43 mg/dL — ABNORMAL HIGH (ref 0.40–1.20)
GFR: 36.72 mL/min — ABNORMAL LOW (ref >60.00)
Glucose, Bld: 143 mg/dL — ABNORMAL HIGH (ref 70–99)
Potassium: 4 meq/L (ref 3.5–5.1)
Sodium: 136 meq/L (ref 135–145)
Total Bilirubin: 0.5 mg/dL (ref 0.2–1.2)
Total Protein: 8.1 g/dL (ref 6.0–8.3)

## 2023-04-26 LAB — MICROALBUMIN / CREATININE URINE RATIO
Creatinine,U: 56.5 mg/dL
Microalb Creat Ratio: 13.2 mg/g (ref 0.0–30.0)
Microalb, Ur: 0.7 mg/dL (ref 0.0–1.9)

## 2023-04-26 LAB — HEMOGLOBIN A1C: Hgb A1c MFr Bld: 7.6 % — ABNORMAL HIGH (ref 4.6–6.5)

## 2023-04-26 LAB — TSH: TSH: 1.65 u[IU]/mL (ref 0.35–5.50)

## 2023-04-26 MED ORDER — METFORMIN HCL ER 750 MG PO TB24: 750.0000 mg | ORAL_TABLET | Freq: Every day | ORAL | 3 refills | Status: DC

## 2023-04-26 NOTE — Progress Notes (Signed)
 Subjective  CC:  Chief Complaint  Patient presents with   surgery clearance    Pt is scheduled to have back surgery on 05/19/2023 and need clearance    HPI: Amy Bartlett is a 73 y.o. female who presents to the office today for follow up of diabetes and problems listed above in the chief complaint.  Discussed the use of AI scribe software for clinical note transcription with the patient, who gave verbal consent to proceed.  History of Present Illness   Amy Bartlett "Amy Bartlett" is a 73 year old female with osteoporosis and diabetes who presents for follow-up and pre-operative evaluation for upcoming back surgery.  She is scheduled for back surgery on May 19, 2023, in [place] , where her daughter resides. Her L4 and L5 vertebrae are rubbing together due to the absence of cartilage, necessitating a fusion procedure. She has been in [place]  since December to care for her sister, who had tumors removed. Needs preop clearance.  She has a history of osteoporosis and previously took Fosamax  but discontinued it in November 2024 due to severe leg pain and cramps. She reports improvement in symptoms since stopping the medication. She recently had a bone density test in [place]  due to scheduling delays locally, but the results are not yet available. Also had mammogram. Trying to get records.   She has diabetes, with her last A1c recorded at 7.2 in November 2024. She has been working on Patent examiner and is concerned about maintaining good control for her upcoming surgery. No current issues with sores on her feet, but she mentions some pain in her middle toe. On trulicity  and jardiance .   She experiences acid reflux and takes omeprazole  intermittently, noting that it helps when taken. She was under the impression that it should be taken for two weeks at a time, but she experiences frequent symptoms.  No chest pain, palpitations, or shortness of breath. Reports ongoing acid  reflux symptoms. Not currently taking any iron supplements.      Wt Readings from Last 3 Encounters:  04/26/23 196 lb 9.6 oz (89.2 kg)  12/21/22 200 lb 3.2 oz (90.8 kg)  11/22/22 197 lb 9.6 oz (89.6 kg)    BP Readings from Last 3 Encounters:  04/26/23 130/78  12/21/22 120/76  11/22/22 110/66    Assessment  1. Type 2 diabetes mellitus with stage 3b chronic kidney disease, without long-term current use of insulin (HCC)   2. Age-related osteoporosis without fracture   3. Asymptomatic menopausal state   4. Diabetic peripheral neuropathy associated with type 2 diabetes mellitus (HCC)   5. Combined hyperlipidemia associated with type 2 diabetes mellitus (HCC)   6. Hypertension associated with diabetes (HCC)   7. Class 2 severe obesity due to excess calories with serious comorbidity and body mass index (BMI) of 35.0 to 35.9 in adult (HCC)   8. Stage 3a chronic kidney disease (HCC)   9. Preoperative clearance      Plan  Assessment and Plan    Lumbar disc disorder with myelopathy Scheduled for back surgery on May 15th in [place] . L4 and L5 vertebrae require fusion and bone grafting. - Ensure diabetes is well-controlled prior to surgery. - Order preoperative blood and urine tests.  Type 2 diabetes mellitus Diabetes management is crucial for upcoming back surgery. Previous A1c was 7.2. Aim for A1c less than 7 for optimal healing. - Order blood work to assess current diabetes control. - Schedule follow-up appointment in 3 months for diabetes recheck.  HTN: monitor at home readings. Borderline high here. Continue ace/hydrochlorothiazide  combo.   Gastro-esophageal reflux disease (GERD) Intermittent symptoms managed with omeprazole . Chronic symptoms may require regular use. - Advise to take omeprazole  daily for chronic reflux symptoms.  Osteoporosis treatment intolerance Experienced leg pain and cramps with Fosamax . Bone density results pending for future management. - Discontinue  Fosamax  due to intolerance. - Review bone density results to determine future osteoporosis management.  Wellness Visit Missed physical in January. - Order blood work to assess overall health status.        Follow up: 3 mo for htn and dm. Orders Placed This Encounter  Procedures   CBC with Differential/Platelet   Comprehensive metabolic panel with GFR   Lipid panel   Hemoglobin A1c   TSH   Microalbumin / creatinine urine ratio   No orders of the defined types were placed in this encounter.     Immunization History  Administered Date(s) Administered   Fluad Quad(high Dose 65+) 09/08/2018, 11/12/2019, 10/17/2020, 10/30/2021   Fluad Trivalent(High Dose 65+) 10/11/2022   PFIZER(Purple Top)SARS-COV-2 Vaccination 02/11/2019, 03/04/2019, 10/15/2019   PNEUMOCOCCAL CONJUGATE-20 12/18/2020   Pfizer Covid-19 Vaccine Bivalent Booster 108yrs & up 11/18/2020   Tdap 04/14/2011   Zoster Recombinant(Shingrix ) 12/18/2020, 02/04/2021   Zoster, Live 04/14/2011    Diabetes Related Lab Review: Lab Results  Component Value Date   HGBA1C 7.2 (A) 10/11/2022   HGBA1C 7.7 (A) 07/07/2022   HGBA1C 7.6 (H) 01/06/2022    Lab Results  Component Value Date   MICROALBUR <0.7 01/06/2022   Lab Results  Component Value Date   CREATININE 1.50 (H) 11/22/2022   BUN 29 (H) 11/22/2022   NA 137 11/22/2022   K 4.4 11/22/2022   CL 101 11/22/2022   CO2 26 11/22/2022   Lab Results  Component Value Date   CHOL 126 10/11/2022   CHOL 175 07/07/2022   CHOL 212 (H) 01/06/2022   Lab Results  Component Value Date   HDL 52.40 10/11/2022   HDL 51.60 07/07/2022   HDL 66.10 01/06/2022   Lab Results  Component Value Date   LDLCALC 51 10/11/2022   LDLCALC 99 07/07/2022   LDLCALC 120 (H) 01/06/2022   Lab Results  Component Value Date   TRIG 113.0 10/11/2022   TRIG 123.0 07/07/2022   TRIG 129.0 01/06/2022   Lab Results  Component Value Date   CHOLHDL 2 10/11/2022   CHOLHDL 3 07/07/2022   CHOLHDL  3 01/06/2022   Lab Results  Component Value Date   LDLDIRECT 134.0 01/02/2020   The ASCVD Risk score (Arnett DK, et al., 2019) failed to calculate for the following reasons:   The valid total cholesterol range is 130 to 320 mg/dL I have reviewed the PMH, Fam and Soc history. Patient Active Problem List   Diagnosis Date Noted Date Diagnosed   Peripheral vascular disease with claudication (HCC) 01/06/2022     Priority: High   Spinal stenosis of lumbar region with neurogenic claudication 01/06/2022     Priority: High   Class 2 severe obesity due to excess calories with serious comorbidity and body mass index (BMI) of 35.0 to 35.9 in adult [facility]) 03/10/2021     Priority: High   Diabetic peripheral neuropathy associated with type 2 diabetes mellitus (HCC) 12/18/2020     Priority: High   Cardiac pacemaker in situ 03/19/2019     Priority: High    Added automatically from request for surgery [id number]    CKD (chronic kidney disease) stage 3,  GFR 30-59 ml/min (HCC) 10/04/2016     Priority: High   NSVT (nonsustained ventricular tachycardia) 12/05/2015     Priority: High    Noted on PPM.  Echo on 08/2015 EF normal     History of complete heart block 10/04/2012     Priority: High    Overview:  Dr. Claudius Cumins, S/p pacemaker, stable 2014/cla No ischemia by stress testing 2011    Combined hyperlipidemia associated with type 2 diabetes mellitus (HCC) 11/04/2011     Priority: High   Type 2 diabetes mellitus with stage 3b chronic kidney disease, without long-term current use of insulin (HCC) 11/04/2011     Priority: High    Jardiance , trulicity . Tolerates metformin     Hypertension associated with diabetes (HCC) 01/11/2011     Priority: High   Spondylosis of lumbar region without myelopathy or radiculopathy 07/14/2020     Priority: Medium    Obesity (BMI 30-39.9) 01/12/2016     Priority: Medium    Primary localized osteoarthrosis of left lower leg 07/16/2014     Priority: Medium     Talar  dome, toe    Age-related osteoporosis without fracture 06/01/2011     Priority: Medium     Dexa 2022: T = -1.6 lowest, improved on fosamax  x 3 years. Continue x 2 more years and recheck dexa 2024. Drug holiday if stable at that time. T = -2.6 04/2017 in L hip. New dx: started fosamax  06/2017 T = -1.2 05/2011; recheck at age 37.    Hyponatremia - HCTZ 07/15/2020     Priority: Low   Vitamin D  deficiency 04/16/2011     Priority: Low    Social History: Patient  reports that she has never smoked. She has never used smokeless tobacco. She reports that she does not drink alcohol  and does not use drugs.  Review of Systems: Ophthalmic: negative for eye pain, loss of vision or double vision Cardiovascular: negative for chest pain Respiratory: negative for SOB or persistent cough Gastrointestinal: negative for abdominal pain Genitourinary: negative for dysuria or gross hematuria MSK: negative for foot lesions Neurologic: negative for weakness or gait disturbance  Objective  Vitals: BP 130/78 (Cuff Size: Normal)   Pulse 86   Temp 97.7 F (36.5 C)   Wt 196 lb 9.6 oz (89.2 kg)   SpO2 98%   BMI 35.96 kg/m  General: well appearing, no acute distress  Psych:  Alert and oriented, normal mood and affect HEENT:  Normocephalic, atraumatic, moist mucous membranes, supple neck  Cardiovascular:  Nl S1 and S2, RRR without murmur, gallop or rub. no edema Respiratory:  Good breath sounds bilaterally, CTAB with normal effort, no rales Foot exam: no erythema, pallor, or cyanosis visible nl proprioception and sensation to monofilament testing bilaterally, +2 distal pulses bilaterally    Diabetic education: ongoing education regarding chronic disease management for diabetes was given today. We continue to reinforce the ABC's of diabetic management: A1c (<7 or 8 dependent upon patient), tight blood pressure control, and cholesterol management with goal LDL < 100 minimally. We discuss diet strategies,  exercise recommendations, medication options and possible side effects. At each visit, we review recommended immunizations and preventive care recommendations for diabetics and stress that good diabetic control can prevent other problems. See below for this patient's data.   Commons side effects, risks, benefits, and alternatives for medications and treatment plan prescribed today were discussed, and the patient expressed understanding of the given instructions. Patient is instructed to call or message via MyChart if  he/she has any questions or concerns regarding our treatment plan. No barriers to understanding were identified. We discussed Red Flag symptoms and signs in detail. Patient expressed understanding regarding what to do in case of urgent or emergency type symptoms.  Medication list was reconciled, printed and provided to the patient in AVS. Patient instructions and summary information was reviewed with the patient as documented in the AVS. This note was prepared with assistance of Dragon voice recognition software. Occasional wrong-word or sound-a-like substitutions may have occurred due to the inherent limitations of voice recognition software

## 2023-04-26 NOTE — Patient Instructions (Signed)
 Please return in 3 months to recheck diabetes and blood pressure    I will release your lab results to you on your MyChart account with further instructions. You may see the results before I do, but when I review them I will send you a message with my report or have my assistant call you if things need to be discussed. Please reply to my message with any questions. Thank you!   If you have any questions or concerns, please don't hesitate to send me a message via MyChart or call the office at [phone]. Thank you for visiting with us  today! It's our pleasure caring for you.   VISIT SUMMARY:  Today, you came in for a follow-up visit and pre-operative evaluation for your upcoming back surgery. We discussed your history of osteoporosis, diabetes, and acid reflux, and reviewed your current medications and symptoms. We also talked about the importance of managing your diabetes well before your surgery.  YOUR PLAN:  -LUMBAR DISC DISORDER WITH MYELOPATHY: This condition involves the L4 and L5 vertebrae in your lower back rubbing together due to the absence of cartilage, which requires a fusion procedure. Your surgery is scheduled for May 15th in [place] . We will ensure your diabetes is well-controlled before the surgery and have ordered preoperative blood and urine tests.  -TYPE 2 DIABETES MELLITUS: Diabetes is a condition where your blood sugar levels are higher than normal. Your last A1c was 7.2, and we aim to get it below 7 for optimal healing before your surgery. We have ordered blood work to assess your current diabetes control and scheduled a follow-up appointment in 3 months.  -GASTRO-ESOPHAGEAL REFLUX DISEASE (GERD): GERD is a condition where stomach acid frequently flows back into the tube connecting your mouth and stomach, causing discomfort. You have been managing it with omeprazole  intermittently, but for chronic symptoms, it is advised to take omeprazole  daily.  -OSTEOPOROSIS TREATMENT  INTOLERANCE: Osteoporosis is a condition that weakens bones, making them fragile and more likely to break. You experienced leg pain and cramps with Fosamax , so we have discontinued it. We will review your pending bone density results to determine future management.  Amy Bartlett VISIT: You missed your physical in January, so we have ordered blood work to assess your overall health status.  INSTRUCTIONS:  Please ensure you complete the preoperative blood and urine tests as soon as possible. We have also ordered blood work to assess your diabetes control and overall health status. Your follow-up appointment for diabetes recheck is scheduled in 3 months. Take omeprazole  daily for chronic acid reflux symptoms. We will review your bone density results once they are available to determine the next steps for managing your osteoporosis.

## 2023-04-26 NOTE — Progress Notes (Signed)
 Please call patient:lab results show worsening diabetic control as we discussed. I recommend improving diet and starting metformin ; but would do this after her surgery. The higher A1c will make her healing a bit more difficult.  Other labs are stable.  Order metformin  xr 750mg  daily with breakfast. Send to her pharmacy of choice.

## 2023-04-27 DIAGNOSIS — M792 Neuralgia and neuritis, unspecified: Secondary | ICD-10-CM | POA: Diagnosis not present

## 2023-04-27 DIAGNOSIS — M48062 Spinal stenosis, lumbar region with neurogenic claudication: Secondary | ICD-10-CM | POA: Diagnosis not present

## 2023-04-27 DIAGNOSIS — M5451 Vertebrogenic low back pain: Secondary | ICD-10-CM | POA: Diagnosis not present

## 2023-04-27 DIAGNOSIS — M545 Low back pain, unspecified: Secondary | ICD-10-CM | POA: Diagnosis not present

## 2023-04-29 ENCOUNTER — Encounter: Payer: Self-pay | Admitting: Family Medicine

## 2023-04-29 DIAGNOSIS — Z0279 Encounter for issue of other medical certificate: Secondary | ICD-10-CM

## 2023-05-04 ENCOUNTER — Encounter: Payer: Self-pay | Admitting: Podiatry

## 2023-05-04 ENCOUNTER — Ambulatory Visit (INDEPENDENT_AMBULATORY_CARE_PROVIDER_SITE_OTHER): Admitting: Podiatry

## 2023-05-04 DIAGNOSIS — M79675 Pain in left toe(s): Secondary | ICD-10-CM | POA: Diagnosis not present

## 2023-05-04 DIAGNOSIS — B351 Tinea unguium: Secondary | ICD-10-CM | POA: Diagnosis not present

## 2023-05-04 DIAGNOSIS — M79674 Pain in right toe(s): Secondary | ICD-10-CM | POA: Diagnosis not present

## 2023-05-04 DIAGNOSIS — E1142 Type 2 diabetes mellitus with diabetic polyneuropathy: Secondary | ICD-10-CM | POA: Diagnosis not present

## 2023-05-05 DIAGNOSIS — Z01810 Encounter for preprocedural cardiovascular examination: Secondary | ICD-10-CM | POA: Diagnosis not present

## 2023-05-09 NOTE — Progress Notes (Signed)
  Subjective:  Patient ID: Amy Bartlett, female    DOB: 12-04-1950,  MRN: [id number]  73 y.o. female presents to clinic with  preventative diabetic foot care and painful, elongated thickened toenails x 10 which are symptomatic when wearing enclosed shoe gear. This interferes with his/her daily activities.  Chief Complaint  Patient presents with   Routine foot care    Rm 15/ diabetic/IDDM/ Last blood sugar unknown/ Last AlC unknown/ PCP Dr.Andy last visit 04/26/23     New problem(s): None   PCP is Amy Saha, MD.  No Known Allergies  Review of Systems: Negative except as noted in the HPI.   Objective:  Amy Bartlett is a pleasant 73 y.o. female WD, WN in NAD. AAO x 3.  Vascular Examination: Vascular status intact b/l with palpable pedal pulses. CFT immediate b/l. No edema. No pain with calf compression b/l. Skin temperature gradient WNL b/l. No ischemia or gangrene noted b/l LE. No cyanosis or clubbing noted b/l LE.  Neurological Examination: Sensation grossly intact b/l with 10 gram monofilament. Vibratory sensation intact b/l.   Dermatological Examination: Pedal skin with normal turgor, texture and tone b/l. Toenails 1-5 b/l thick, discolored, elongated with subungual debris and pain on dorsal palpation. No hyperkeratotic lesions noted b/l.   Musculoskeletal Examination: Muscle strength 5/5 to b/l LE. HAV with bunion bilaterally and hammertoes 2-5 b/l.  Radiographs: None  Last A1c:      Latest Ref Rng & Units 04/26/2023    8:26 AM 10/11/2022   10:09 AM 07/07/2022    9:44 AM  Hemoglobin A1C  Hemoglobin-A1c 4.6 - 6.5 % 7.6  7.2  7.7      Assessment:   1. Pain due to onychomycosis of toenails of both feet   2. Diabetic peripheral neuropathy associated with type 2 diabetes mellitus (HCC)    Plan:  Consent given for treatment. Patient examined. All patient's and/or POA's questions/concerns addressed on today's visit. Mycotic toenails 1-5 debrided in  length and girth without incident. Continue foot and shoe inspections daily. Monitor blood glucose per PCP/Endocrinologist's recommendations.Continue soft, supportive shoe gear daily. Report any pedal injuries to medical professional. Call office if there are any quesitons/concerns. -Patient/POA to call should there be question/concern in the interim.  Return in about 3 months (around 08/03/2023).  Amy Bartlett, Amy Bartlett      [place] LOCATION: [address], [place] [address]                   Office [phone]   [facility] LOCATION: [address] [place], [place] [address] Office [phone]

## 2023-05-16 DIAGNOSIS — D62 Acute posthemorrhagic anemia: Secondary | ICD-10-CM | POA: Diagnosis not present

## 2023-05-16 DIAGNOSIS — E785 Hyperlipidemia, unspecified: Secondary | ICD-10-CM | POA: Diagnosis not present

## 2023-05-16 DIAGNOSIS — M48062 Spinal stenosis, lumbar region with neurogenic claudication: Secondary | ICD-10-CM | POA: Diagnosis not present

## 2023-05-16 DIAGNOSIS — Z950 Presence of cardiac pacemaker: Secondary | ICD-10-CM | POA: Diagnosis not present

## 2023-05-16 DIAGNOSIS — M4316 Spondylolisthesis, lumbar region: Secondary | ICD-10-CM | POA: Diagnosis not present

## 2023-05-16 DIAGNOSIS — Z01818 Encounter for other preprocedural examination: Secondary | ICD-10-CM | POA: Diagnosis not present

## 2023-05-16 DIAGNOSIS — M4726 Other spondylosis with radiculopathy, lumbar region: Secondary | ICD-10-CM | POA: Diagnosis not present

## 2023-05-16 DIAGNOSIS — M4727 Other spondylosis with radiculopathy, lumbosacral region: Secondary | ICD-10-CM | POA: Diagnosis not present

## 2023-05-16 DIAGNOSIS — E119 Type 2 diabetes mellitus without complications: Secondary | ICD-10-CM | POA: Diagnosis not present

## 2023-05-16 DIAGNOSIS — I10 Essential (primary) hypertension: Secondary | ICD-10-CM | POA: Diagnosis not present

## 2023-05-19 DIAGNOSIS — M4727 Other spondylosis with radiculopathy, lumbosacral region: Secondary | ICD-10-CM | POA: Diagnosis not present

## 2023-05-19 DIAGNOSIS — Z0189 Encounter for other specified special examinations: Secondary | ICD-10-CM | POA: Diagnosis not present

## 2023-05-19 DIAGNOSIS — I10 Essential (primary) hypertension: Secondary | ICD-10-CM | POA: Diagnosis not present

## 2023-05-19 DIAGNOSIS — M47816 Spondylosis without myelopathy or radiculopathy, lumbar region: Secondary | ICD-10-CM | POA: Diagnosis not present

## 2023-05-19 DIAGNOSIS — E119 Type 2 diabetes mellitus without complications: Secondary | ICD-10-CM | POA: Diagnosis not present

## 2023-05-19 DIAGNOSIS — M48062 Spinal stenosis, lumbar region with neurogenic claudication: Secondary | ICD-10-CM | POA: Diagnosis not present

## 2023-05-19 DIAGNOSIS — M4726 Other spondylosis with radiculopathy, lumbar region: Secondary | ICD-10-CM | POA: Diagnosis not present

## 2023-05-19 DIAGNOSIS — M48061 Spinal stenosis, lumbar region without neurogenic claudication: Secondary | ICD-10-CM | POA: Diagnosis not present

## 2023-05-19 DIAGNOSIS — M4807 Spinal stenosis, lumbosacral region: Secondary | ICD-10-CM | POA: Diagnosis not present

## 2023-05-19 DIAGNOSIS — M4316 Spondylolisthesis, lumbar region: Secondary | ICD-10-CM | POA: Diagnosis not present

## 2023-05-19 DIAGNOSIS — R262 Difficulty in walking, not elsewhere classified: Secondary | ICD-10-CM | POA: Diagnosis not present

## 2023-05-19 DIAGNOSIS — E785 Hyperlipidemia, unspecified: Secondary | ICD-10-CM | POA: Diagnosis not present

## 2023-05-19 DIAGNOSIS — Z950 Presence of cardiac pacemaker: Secondary | ICD-10-CM | POA: Diagnosis not present

## 2023-05-19 DIAGNOSIS — D62 Acute posthemorrhagic anemia: Secondary | ICD-10-CM | POA: Diagnosis not present

## 2023-06-17 DIAGNOSIS — R262 Difficulty in walking, not elsewhere classified: Secondary | ICD-10-CM | POA: Diagnosis not present

## 2023-06-17 DIAGNOSIS — M6281 Muscle weakness (generalized): Secondary | ICD-10-CM | POA: Diagnosis not present

## 2023-06-17 DIAGNOSIS — M4726 Other spondylosis with radiculopathy, lumbar region: Secondary | ICD-10-CM | POA: Diagnosis not present

## 2023-06-17 DIAGNOSIS — M5459 Other low back pain: Secondary | ICD-10-CM | POA: Diagnosis not present

## 2023-06-20 DIAGNOSIS — R262 Difficulty in walking, not elsewhere classified: Secondary | ICD-10-CM | POA: Diagnosis not present

## 2023-06-20 DIAGNOSIS — M5459 Other low back pain: Secondary | ICD-10-CM | POA: Diagnosis not present

## 2023-06-20 DIAGNOSIS — M6281 Muscle weakness (generalized): Secondary | ICD-10-CM | POA: Diagnosis not present

## 2023-06-23 DIAGNOSIS — R262 Difficulty in walking, not elsewhere classified: Secondary | ICD-10-CM | POA: Diagnosis not present

## 2023-06-23 DIAGNOSIS — M5459 Other low back pain: Secondary | ICD-10-CM | POA: Diagnosis not present

## 2023-06-23 DIAGNOSIS — M6281 Muscle weakness (generalized): Secondary | ICD-10-CM | POA: Diagnosis not present

## 2023-07-06 DIAGNOSIS — R262 Difficulty in walking, not elsewhere classified: Secondary | ICD-10-CM | POA: Diagnosis not present

## 2023-07-06 DIAGNOSIS — M5459 Other low back pain: Secondary | ICD-10-CM | POA: Diagnosis not present

## 2023-07-06 DIAGNOSIS — M6281 Muscle weakness (generalized): Secondary | ICD-10-CM | POA: Diagnosis not present

## 2023-07-12 DIAGNOSIS — M6281 Muscle weakness (generalized): Secondary | ICD-10-CM | POA: Diagnosis not present

## 2023-07-12 DIAGNOSIS — M5459 Other low back pain: Secondary | ICD-10-CM | POA: Diagnosis not present

## 2023-07-12 DIAGNOSIS — R262 Difficulty in walking, not elsewhere classified: Secondary | ICD-10-CM | POA: Diagnosis not present

## 2023-07-14 DIAGNOSIS — M5459 Other low back pain: Secondary | ICD-10-CM | POA: Diagnosis not present

## 2023-07-14 DIAGNOSIS — M6281 Muscle weakness (generalized): Secondary | ICD-10-CM | POA: Diagnosis not present

## 2023-07-14 DIAGNOSIS — R262 Difficulty in walking, not elsewhere classified: Secondary | ICD-10-CM | POA: Diagnosis not present

## 2023-07-19 DIAGNOSIS — M6281 Muscle weakness (generalized): Secondary | ICD-10-CM | POA: Diagnosis not present

## 2023-07-19 DIAGNOSIS — M5459 Other low back pain: Secondary | ICD-10-CM | POA: Diagnosis not present

## 2023-07-19 DIAGNOSIS — R262 Difficulty in walking, not elsewhere classified: Secondary | ICD-10-CM | POA: Diagnosis not present

## 2023-07-21 DIAGNOSIS — M6281 Muscle weakness (generalized): Secondary | ICD-10-CM | POA: Diagnosis not present

## 2023-07-21 DIAGNOSIS — R262 Difficulty in walking, not elsewhere classified: Secondary | ICD-10-CM | POA: Diagnosis not present

## 2023-07-21 DIAGNOSIS — M5459 Other low back pain: Secondary | ICD-10-CM | POA: Diagnosis not present

## 2023-07-22 ENCOUNTER — Ambulatory Visit: Admitting: Family Medicine

## 2023-07-26 ENCOUNTER — Ambulatory Visit: Admitting: Family Medicine

## 2023-07-29 DIAGNOSIS — M5459 Other low back pain: Secondary | ICD-10-CM | POA: Diagnosis not present

## 2023-07-29 DIAGNOSIS — R262 Difficulty in walking, not elsewhere classified: Secondary | ICD-10-CM | POA: Diagnosis not present

## 2023-07-29 DIAGNOSIS — M6281 Muscle weakness (generalized): Secondary | ICD-10-CM | POA: Diagnosis not present

## 2023-08-04 DIAGNOSIS — M6281 Muscle weakness (generalized): Secondary | ICD-10-CM | POA: Diagnosis not present

## 2023-08-04 DIAGNOSIS — R262 Difficulty in walking, not elsewhere classified: Secondary | ICD-10-CM | POA: Diagnosis not present

## 2023-08-04 DIAGNOSIS — M5459 Other low back pain: Secondary | ICD-10-CM | POA: Diagnosis not present

## 2023-08-08 DIAGNOSIS — M5459 Other low back pain: Secondary | ICD-10-CM | POA: Diagnosis not present

## 2023-08-08 DIAGNOSIS — R262 Difficulty in walking, not elsewhere classified: Secondary | ICD-10-CM | POA: Diagnosis not present

## 2023-08-08 DIAGNOSIS — M6281 Muscle weakness (generalized): Secondary | ICD-10-CM | POA: Diagnosis not present

## 2023-08-09 ENCOUNTER — Ambulatory Visit: Admitting: Podiatry

## 2023-08-12 DIAGNOSIS — M6281 Muscle weakness (generalized): Secondary | ICD-10-CM | POA: Diagnosis not present

## 2023-08-12 DIAGNOSIS — M5459 Other low back pain: Secondary | ICD-10-CM | POA: Diagnosis not present

## 2023-08-12 DIAGNOSIS — R262 Difficulty in walking, not elsewhere classified: Secondary | ICD-10-CM | POA: Diagnosis not present

## 2023-08-15 DIAGNOSIS — R262 Difficulty in walking, not elsewhere classified: Secondary | ICD-10-CM | POA: Diagnosis not present

## 2023-08-15 DIAGNOSIS — M5459 Other low back pain: Secondary | ICD-10-CM | POA: Diagnosis not present

## 2023-08-15 DIAGNOSIS — M6281 Muscle weakness (generalized): Secondary | ICD-10-CM | POA: Diagnosis not present

## 2023-08-18 DIAGNOSIS — Z950 Presence of cardiac pacemaker: Secondary | ICD-10-CM | POA: Diagnosis not present

## 2023-08-18 DIAGNOSIS — I442 Atrioventricular block, complete: Secondary | ICD-10-CM | POA: Diagnosis not present

## 2023-08-24 DIAGNOSIS — M792 Neuralgia and neuritis, unspecified: Secondary | ICD-10-CM | POA: Diagnosis not present

## 2023-08-24 DIAGNOSIS — M25562 Pain in left knee: Secondary | ICD-10-CM | POA: Diagnosis not present

## 2023-08-24 DIAGNOSIS — M25561 Pain in right knee: Secondary | ICD-10-CM | POA: Diagnosis not present

## 2023-08-24 DIAGNOSIS — M48062 Spinal stenosis, lumbar region with neurogenic claudication: Secondary | ICD-10-CM | POA: Diagnosis not present

## 2023-08-24 DIAGNOSIS — M545 Low back pain, unspecified: Secondary | ICD-10-CM | POA: Diagnosis not present

## 2023-08-24 DIAGNOSIS — M5451 Vertebrogenic low back pain: Secondary | ICD-10-CM | POA: Diagnosis not present

## 2023-08-24 DIAGNOSIS — G8929 Other chronic pain: Secondary | ICD-10-CM | POA: Diagnosis not present

## 2023-08-31 ENCOUNTER — Telehealth: Payer: Self-pay

## 2023-08-31 ENCOUNTER — Ambulatory Visit (INDEPENDENT_AMBULATORY_CARE_PROVIDER_SITE_OTHER): Admitting: Family Medicine

## 2023-08-31 ENCOUNTER — Encounter: Payer: Self-pay | Admitting: Family Medicine

## 2023-08-31 VITALS — BP 120/79 | HR 79 | Temp 97.7°F | Ht 62.0 in | Wt 193.6 lb

## 2023-08-31 DIAGNOSIS — E1159 Type 2 diabetes mellitus with other circulatory complications: Secondary | ICD-10-CM | POA: Diagnosis not present

## 2023-08-31 DIAGNOSIS — M818 Other osteoporosis without current pathological fracture: Secondary | ICD-10-CM | POA: Diagnosis not present

## 2023-08-31 DIAGNOSIS — K219 Gastro-esophageal reflux disease without esophagitis: Secondary | ICD-10-CM | POA: Insufficient documentation

## 2023-08-31 DIAGNOSIS — N1832 Chronic kidney disease, stage 3b: Secondary | ICD-10-CM | POA: Diagnosis not present

## 2023-08-31 DIAGNOSIS — E1122 Type 2 diabetes mellitus with diabetic chronic kidney disease: Secondary | ICD-10-CM | POA: Diagnosis not present

## 2023-08-31 DIAGNOSIS — I4729 Other ventricular tachycardia: Secondary | ICD-10-CM | POA: Diagnosis not present

## 2023-08-31 DIAGNOSIS — Z7985 Long-term (current) use of injectable non-insulin antidiabetic drugs: Secondary | ICD-10-CM | POA: Diagnosis not present

## 2023-08-31 DIAGNOSIS — M48062 Spinal stenosis, lumbar region with neurogenic claudication: Secondary | ICD-10-CM

## 2023-08-31 DIAGNOSIS — N1831 Chronic kidney disease, stage 3a: Secondary | ICD-10-CM

## 2023-08-31 DIAGNOSIS — I152 Hypertension secondary to endocrine disorders: Secondary | ICD-10-CM

## 2023-08-31 LAB — POCT GLYCOSYLATED HEMOGLOBIN (HGB A1C): Hemoglobin A1C: 6.3 % — AB (ref 4.0–5.6)

## 2023-08-31 MED ORDER — ACCU-CHEK GUIDE ME W/DEVICE KIT: PACK | 0 refills | Status: AC

## 2023-08-31 MED ORDER — ACCU-CHEK SOFTCLIX LANCETS MISC: 12 refills | Status: AC

## 2023-08-31 MED ORDER — TRULICITY 3 MG/0.5ML ~~LOC~~ SOAJ: 3.0000 mg | SUBCUTANEOUS | 3 refills | Status: AC

## 2023-08-31 MED ORDER — ACCU-CHEK GUIDE TEST VI STRP: ORAL_STRIP | 12 refills | Status: DC

## 2023-08-31 NOTE — Telephone Encounter (Signed)
 Copied from CRM #[id number]. Topic: Clinical - Medication Question >> Aug 31, 2023 11:20 AM Burnard DEL wrote: Reason for CRM: [facility] # [address] [place] - [address]  Phone: [phone] Fax: [phone]   Pharmacy would like  to know how many time a day is patient is checking her blood sugar ,so that they can bill insurance correctly?  Spoke with pharmacy

## 2023-08-31 NOTE — Patient Instructions (Signed)
 Please return in 3 months for diabetes follow up   If you have any questions or concerns, please don't hesitate to send me a message via MyChart or call the office at [phone]. Thank you for visiting with us  today! It's our pleasure caring for you.    VISIT SUMMARY: Today, we reviewed your progress following your recent surgery. Your diabetes management has improved significantly, and we discussed your ongoing treatment for osteoporosis and reflux. We also addressed your knee discomfort and your sister's health situation.  YOUR PLAN: -TYPE 2 DIABETES MELLITUS: Type 2 diabetes is a condition where your body does not use insulin properly, leading to high blood sugar levels. Your diabetes is well-controlled with an A1c of 6.3%, thanks to your dietary changes. Continue taking Trulicity  and Jardiance , and we will refill your Trulicity  prescription. A new glucose meter and test strips will be ordered for you.  -OSTEOPOROSIS: Osteoporosis is a condition that weakens bones, making them fragile and more likely to break. You had issues with Fosamax  in the past. We will review your recent bone density results, and if they are poor, we will start you on Prolia treatment, pending insurance approval.  -GASTROESOPHAGEAL REFLUX DISEASE: Gastroesophageal reflux disease (GERD) is a condition where stomach acid frequently flows back into the tube connecting your mouth and stomach, causing irritation. Your reflux is managed with omeprazole , which you should continue taking daily.  -ESSENTIAL HYPERTENSION: Essential hypertension is high blood pressure with no identifiable cause. Your blood pressure is well-controlled with your current treatment, so no changes are needed.  INSTRUCTIONS: Please follow up with your healthcare provider in [place] for your post-surgery check-up and physical therapy. Ensure you get your Trulicity  prescription refilled and start using the new glucose meter and test strips once they  arrive. We will review your bone density results and discuss the possibility of starting Prolia if needed.                      Contains text generated by Abridge.                                 Contains text generated by Abridge.

## 2023-08-31 NOTE — Progress Notes (Signed)
 Subjective  CC:  Chief Complaint  Patient presents with   Hypertension    Pt stated that DEXA/Mammo was done at [facility] in [place]     Diabetes    HPI: Amy Bartlett is a 73 y.o. female who presents to the office today for follow up of diabetes and problems listed above in the chief complaint.  Discussed the use of AI scribe software for clinical note transcription with the patient, who gave verbal consent to proceed.  History of Present Illness Amy Bartlett is a 73 year old female with diabetes and osteoporosis who presents for follow-up after surgery; lumbar surgery for symptomatic spinal stenosis, done at [facility] in [place] .  I cannot pull up records from care everywhere.  Patient reports surgery went well.  She is recovering well.  Has follow-up visit in a few weeks.  Completing physical therapy.  Decreased pain..  She underwent surgery at [facility] in [place], [place] , and has been back in town for two weeks as of August 31, 2023. She plans to return to [facility] for follow-up and physical therapy.  Follow-up diabetes, in April A1c was elevated.  She never started metformin  but diet has changed considerably.  Her diabetes management has improved, with her A1c decreasing from 7.6% in April to 6.3% currently. She attributes this improvement to dietary changes, including eating less and reducing sweets during her recovery. She has not been taking metformin  but continues on Trulicity  and Jardiance . She has been out of Trulicity  since returning from [place] and requires a refill.  No neuropathy symptoms.  Chronic kidney disease is stable.  She has a history of osteoporosis and had a bone density test done in [place]  prior to her surgery, which was satisfactory for proceeding with the surgery. She previously experienced gastrointestinal issues with Fosamax  and has not resumed it  post-surgery.  Her reflux symptoms have improved, but she still experiences excessive saliva. She takes omeprazole  regularly to manage her reflux.  No heart palpitations are reported. She mentions difficulty with her knees, making it hard to get up.  Her sister is undergoing chemotherapy for lung cancer, which originated in the uterus and spread to the brain. Her sister resides in the [place] but comes to the [place] for treatment.    Wt Readings from Last 3 Encounters:  08/31/23 193 lb 9.6 oz (87.8 kg)  04/26/23 196 lb 9.6 oz (89.2 kg)  12/21/22 200 lb 3.2 oz (90.8 kg)    BP Readings from Last 3 Encounters:  08/31/23 120/79  04/26/23 130/78  12/21/22 120/76    Assessment  1. Type 2 diabetes mellitus with stage 3b chronic kidney disease, without long-term current use of insulin (HCC)   2. Stage 3a chronic kidney disease (HCC)   3. Hypertension associated with diabetes (HCC)   4. Spinal stenosis of lumbar region with neurogenic claudication   5. Age-related osteoporosis without fracture   6. NSVT (nonsustained ventricular tachycardia) (HCC) Chronic  7. Long-term (current) use of injectable non-insulin antidiabetic drugs   8. Gastroesophageal reflux disease, unspecified whether esophagitis present      Plan  Assessment and Plan Assessment & Plan Type 2 diabetes mellitus Well-controlled with A1c of 6.3. Improvement due to dietary changes post-surgery. No need for metformin . - Continue Trulicity  and Jardiance . - Refill Trulicity  prescription. - Order new glucose meter and test strips. - Continue healthy diet  Osteoporosis Previous intolerance to alendronate . Prolia recommended if bone density poor. - Review  bone density results once available.  Will request records from [place] . - If bone density is poor, initiate Prolia treatment pending insurance approval.  Gastroesophageal reflux disease Managed with omeprazole . Symptoms include excessive saliva production. - Recommend  omeprazole  daily.  Essential hypertension Well-controlled with current management.  On ACE/thiazide.  Hyperlipidemia is controlled  Spinal stenosis improved after surgery.    Follow up: 3 months for recheck Orders Placed This Encounter  Procedures   POCT HgB A1C   Meds ordered this encounter  Medications   Dulaglutide  (TRULICITY ) 3 MG/0.5ML SOAJ    Sig: Inject 3 mg as directed once a week.    Dispense:  6 mL    Refill:  3    DX code: E11.22 and N18.32      Immunization History  Administered Date(s) Administered   Fluad Quad(high Dose 65+) 09/08/2018, 11/12/2019, 10/17/2020, 10/30/2021   Fluad Trivalent(High Dose 65+) 10/11/2022   PFIZER(Purple Top)SARS-COV-2 Vaccination 02/11/2019, 03/04/2019, 10/15/2019   PNEUMOCOCCAL CONJUGATE-20 12/18/2020   Pfizer Covid-19 Vaccine Bivalent Booster 20yrs & up 11/18/2020   Tdap 04/14/2011   Zoster Recombinant(Shingrix ) 12/18/2020, 02/04/2021   Zoster, Live 04/14/2011    Diabetes Related Lab Review: Lab Results  Component Value Date   HGBA1C 6.3 (A) 08/31/2023   HGBA1C 7.6 (H) 04/26/2023   HGBA1C 7.2 (A) 10/11/2022    Lab Results  Component Value Date   MICROALBUR 0.7 04/26/2023   Lab Results  Component Value Date   CREATININE 1.43 (H) 04/26/2023   BUN 34 (H) 04/26/2023   NA 136 04/26/2023   K 4.0 04/26/2023   CL 100 04/26/2023   CO2 28 04/26/2023   Lab Results  Component Value Date   CHOL 161 04/26/2023   CHOL 126 10/11/2022   CHOL 175 07/07/2022   Lab Results  Component Value Date   HDL 60.20 04/26/2023   HDL 52.40 10/11/2022   HDL 51.60 07/07/2022   Lab Results  Component Value Date   LDLCALC 80 04/26/2023   LDLCALC 51 10/11/2022   LDLCALC 99 07/07/2022   Lab Results  Component Value Date   TRIG 101.0 04/26/2023   TRIG 113.0 10/11/2022   TRIG 123.0 07/07/2022   Lab Results  Component Value Date   CHOLHDL 3 04/26/2023   CHOLHDL 2 10/11/2022   CHOLHDL 3 07/07/2022   Lab Results  Component  Value Date   LDLDIRECT 134.0 01/02/2020   The 10-year ASCVD risk score (Arnett DK, et al., 2019) is: 22.3%   Values used to calculate the score:     Age: 29 years     Clincally relevant sex: Female     Is Non-Hispanic African American: Yes     Diabetic: Yes     Tobacco smoker: No     Systolic Blood Pressure: 120 mmHg     Is BP treated: Yes     HDL Cholesterol: 60.2 mg/dL     Total Cholesterol: 161 mg/dL I have reviewed the PMH, Fam and Soc history. Patient Active Problem List   Diagnosis Date Noted   Peripheral vascular disease with claudication (HCC) 01/06/2022    Priority: High   Spinal stenosis of lumbar region with neurogenic claudication 01/06/2022    Priority: High   Class 2 severe obesity due to excess calories with serious comorbidity and body mass index (BMI) of 35.0 to 35.9 in adult [facility]) 03/10/2021    Priority: High   Diabetic peripheral neuropathy associated with type 2 diabetes mellitus (HCC) 12/18/2020    Priority: High  Cardiac pacemaker in situ 03/19/2019    Priority: High    Added automatically from request for surgery [id number]    CKD (chronic kidney disease) stage 3, GFR 30-59 ml/min (HCC) 10/04/2016    Priority: High   NSVT (nonsustained ventricular tachycardia) 12/05/2015    Priority: High    Noted on PPM.  Echo on 08/2015 EF normal     History of complete heart block 10/04/2012    Priority: High    Overview:  Dr. Autry, S/p pacemaker, stable 2014/cla No ischemia by stress testing 2011    Combined hyperlipidemia associated with type 2 diabetes mellitus (HCC) 11/04/2011    Priority: High   Type 2 diabetes mellitus with stage 3b chronic kidney disease, without long-term current use of insulin (HCC) 11/04/2011    Priority: High    Jardiance , trulicity . Tolerates metformin     Hypertension associated with diabetes (HCC) 01/11/2011    Priority: High   Spondylosis of lumbar region without myelopathy or radiculopathy 07/14/2020    Priority: Medium     Obesity (BMI 30-39.9) 01/12/2016    Priority: Medium    Primary localized osteoarthrosis of left lower leg 07/16/2014    Priority: Medium     Talar dome, toe    Age-related osteoporosis without fracture 06/01/2011    Priority: Medium     Dexa 2022: T = -1.6 lowest, improved on fosamax  x 3 years. Continue x 2 more years and recheck dexa 2024. Drug holiday if stable at that time. T = -2.6 04/2017 in L hip. New dx: started fosamax  06/2017 T = -1.2 05/2011; recheck at age 82.    Hyponatremia - HCTZ 07/15/2020    Priority: Low   Vitamin D  deficiency 04/16/2011    Priority: Low   Gastroesophageal reflux disease 08/31/2023    Social History: Patient  reports that she has never smoked. She has never used smokeless tobacco. She reports that she does not drink alcohol  and does not use drugs.  Review of Systems: Ophthalmic: negative for eye pain, loss of vision or double vision Cardiovascular: negative for chest pain Respiratory: negative for SOB or persistent cough Gastrointestinal: negative for abdominal pain Genitourinary: negative for dysuria or gross hematuria MSK: negative for foot lesions Neurologic: negative for weakness or gait disturbance  Objective  Vitals: BP 120/79   Pulse 79   Temp 97.7 F (36.5 C)   Ht 5' 2 (1.575 m)   Wt 193 lb 9.6 oz (87.8 kg)   SpO2 98%   BMI 35.41 kg/m  General: well appearing, no acute distress  Psych:  Alert and oriented, normal mood and affect HEENT:  Normocephalic, atraumatic, moist mucous membranes, supple neck  Cardiovascular:  Nl S1 and S2, RRR without murmur, gallop or rub. no edema Respiratory:  Good breath sounds bilaterally, CTAB with normal effort, no rales Foot exam: no erythema, pallor, or cyanosis visible nl proprioception and sensation to monofilament testing bilaterally, +2 distal pulses bilaterally  Diabetic education: ongoing education regarding chronic disease management for diabetes was given today. We continue to reinforce  the ABC's of diabetic management: A1c (<7 or 8 dependent upon patient), tight blood pressure control, and cholesterol management with goal LDL < 100 minimally. We discuss diet strategies, exercise recommendations, medication options and possible side effects. At each visit, we review recommended immunizations and preventive care recommendations for diabetics and stress that good diabetic control can prevent other problems. See below for this patient's data. Commons side effects, risks, benefits, and alternatives for medications and treatment plan  prescribed today were discussed, and the patient expressed understanding of the given instructions. Patient is instructed to call or message via MyChart if he/she has any questions or concerns regarding our treatment plan. No barriers to understanding were identified. We discussed Red Flag symptoms and signs in detail. Patient expressed understanding regarding what to do in case of urgent or emergency type symptoms.  Medication list was reconciled, printed and provided to the patient in AVS. Patient instructions and summary information was reviewed with the patient as documented in the AVS. This note was prepared with assistance of Dragon voice recognition software. Occasional wrong-word or sound-a-like substitutions may have occurred due to the inherent limitations of voice recognition software

## 2023-09-02 ENCOUNTER — Other Ambulatory Visit: Payer: Self-pay | Admitting: Family Medicine

## 2023-09-02 DIAGNOSIS — E785 Hyperlipidemia, unspecified: Secondary | ICD-10-CM

## 2023-09-08 ENCOUNTER — Encounter: Payer: Self-pay | Admitting: Family Medicine

## 2023-09-08 DIAGNOSIS — M1711 Unilateral primary osteoarthritis, right knee: Secondary | ICD-10-CM | POA: Diagnosis not present

## 2023-09-08 DIAGNOSIS — M25562 Pain in left knee: Secondary | ICD-10-CM | POA: Diagnosis not present

## 2023-09-08 DIAGNOSIS — M170 Bilateral primary osteoarthritis of knee: Secondary | ICD-10-CM | POA: Diagnosis not present

## 2023-09-08 DIAGNOSIS — M25561 Pain in right knee: Secondary | ICD-10-CM | POA: Diagnosis not present

## 2023-09-08 DIAGNOSIS — M1712 Unilateral primary osteoarthritis, left knee: Secondary | ICD-10-CM | POA: Diagnosis not present

## 2023-09-19 DIAGNOSIS — H2513 Age-related nuclear cataract, bilateral: Secondary | ICD-10-CM | POA: Diagnosis not present

## 2023-09-19 DIAGNOSIS — E119 Type 2 diabetes mellitus without complications: Secondary | ICD-10-CM | POA: Diagnosis not present

## 2023-09-19 DIAGNOSIS — H04123 Dry eye syndrome of bilateral lacrimal glands: Secondary | ICD-10-CM | POA: Diagnosis not present

## 2023-09-19 LAB — HM DIABETES EYE EXAM

## 2023-09-21 DIAGNOSIS — M25552 Pain in left hip: Secondary | ICD-10-CM | POA: Diagnosis not present

## 2023-09-21 DIAGNOSIS — M4726 Other spondylosis with radiculopathy, lumbar region: Secondary | ICD-10-CM | POA: Diagnosis not present

## 2023-09-21 DIAGNOSIS — M160 Bilateral primary osteoarthritis of hip: Secondary | ICD-10-CM | POA: Diagnosis not present

## 2023-09-21 DIAGNOSIS — M25551 Pain in right hip: Secondary | ICD-10-CM | POA: Diagnosis not present

## 2023-09-22 DIAGNOSIS — M5459 Other low back pain: Secondary | ICD-10-CM | POA: Diagnosis not present

## 2023-09-22 DIAGNOSIS — R262 Difficulty in walking, not elsewhere classified: Secondary | ICD-10-CM | POA: Diagnosis not present

## 2023-09-22 DIAGNOSIS — M6281 Muscle weakness (generalized): Secondary | ICD-10-CM | POA: Diagnosis not present

## 2023-09-27 NOTE — Progress Notes (Signed)
 Amy Bartlett                                          MRN: [id number]   09/27/2023   The [facility] Quality Team Specialist reviewed this patient medical record for the purposes of chart review for care gap closure. The following were reviewed: abstraction for care gap closure-glycemic status assessment.    VBCI Quality Team

## 2023-09-30 DIAGNOSIS — M5459 Other low back pain: Secondary | ICD-10-CM | POA: Diagnosis not present

## 2023-09-30 DIAGNOSIS — M6281 Muscle weakness (generalized): Secondary | ICD-10-CM | POA: Diagnosis not present

## 2023-09-30 DIAGNOSIS — R262 Difficulty in walking, not elsewhere classified: Secondary | ICD-10-CM | POA: Diagnosis not present

## 2023-10-01 DIAGNOSIS — M6281 Muscle weakness (generalized): Secondary | ICD-10-CM | POA: Diagnosis not present

## 2023-10-01 DIAGNOSIS — R262 Difficulty in walking, not elsewhere classified: Secondary | ICD-10-CM | POA: Diagnosis not present

## 2023-10-01 DIAGNOSIS — M5459 Other low back pain: Secondary | ICD-10-CM | POA: Diagnosis not present

## 2023-10-04 DIAGNOSIS — M5459 Other low back pain: Secondary | ICD-10-CM | POA: Diagnosis not present

## 2023-10-04 DIAGNOSIS — R262 Difficulty in walking, not elsewhere classified: Secondary | ICD-10-CM | POA: Diagnosis not present

## 2023-10-04 DIAGNOSIS — M6281 Muscle weakness (generalized): Secondary | ICD-10-CM | POA: Diagnosis not present

## 2023-10-05 DIAGNOSIS — M6281 Muscle weakness (generalized): Secondary | ICD-10-CM | POA: Diagnosis not present

## 2023-10-05 DIAGNOSIS — M5459 Other low back pain: Secondary | ICD-10-CM | POA: Diagnosis not present

## 2023-10-05 DIAGNOSIS — R262 Difficulty in walking, not elsewhere classified: Secondary | ICD-10-CM | POA: Diagnosis not present

## 2023-10-12 ENCOUNTER — Ambulatory Visit (HOSPITAL_BASED_OUTPATIENT_CLINIC_OR_DEPARTMENT_OTHER): Admitting: Physical Therapy

## 2023-10-12 DIAGNOSIS — R262 Difficulty in walking, not elsewhere classified: Secondary | ICD-10-CM | POA: Diagnosis not present

## 2023-10-12 DIAGNOSIS — M6281 Muscle weakness (generalized): Secondary | ICD-10-CM | POA: Diagnosis not present

## 2023-10-12 DIAGNOSIS — M5459 Other low back pain: Secondary | ICD-10-CM | POA: Diagnosis not present

## 2023-10-17 DIAGNOSIS — R262 Difficulty in walking, not elsewhere classified: Secondary | ICD-10-CM | POA: Diagnosis not present

## 2023-10-17 DIAGNOSIS — M6281 Muscle weakness (generalized): Secondary | ICD-10-CM | POA: Diagnosis not present

## 2023-10-17 DIAGNOSIS — M5459 Other low back pain: Secondary | ICD-10-CM | POA: Diagnosis not present

## 2023-10-19 DIAGNOSIS — M5459 Other low back pain: Secondary | ICD-10-CM | POA: Diagnosis not present

## 2023-10-19 DIAGNOSIS — M6281 Muscle weakness (generalized): Secondary | ICD-10-CM | POA: Diagnosis not present

## 2023-10-19 DIAGNOSIS — R262 Difficulty in walking, not elsewhere classified: Secondary | ICD-10-CM | POA: Diagnosis not present

## 2023-10-25 ENCOUNTER — Ambulatory Visit (INDEPENDENT_AMBULATORY_CARE_PROVIDER_SITE_OTHER)

## 2023-10-25 VITALS — Ht 62.0 in | Wt 193.0 lb

## 2023-10-25 DIAGNOSIS — Z0000 Encounter for general adult medical examination without abnormal findings: Secondary | ICD-10-CM | POA: Diagnosis not present

## 2023-10-25 DIAGNOSIS — R262 Difficulty in walking, not elsewhere classified: Secondary | ICD-10-CM | POA: Diagnosis not present

## 2023-10-25 DIAGNOSIS — M5459 Other low back pain: Secondary | ICD-10-CM | POA: Diagnosis not present

## 2023-10-25 DIAGNOSIS — M6281 Muscle weakness (generalized): Secondary | ICD-10-CM | POA: Diagnosis not present

## 2023-10-25 NOTE — Patient Instructions (Signed)
 Ms. Cardin,  Thank you for taking the time for your Medicare Wellness Visit. I appreciate your continued commitment to your health goals. Please review the care plan we discussed, and feel free to reach out if I can assist you further.  Medicare recommends these wellness visits once per year to help you and your care team stay ahead of potential health issues. These visits are designed to focus on prevention, allowing your provider to concentrate on managing your acute and chronic conditions during your regular appointments.  Please note that Annual Wellness Visits do not include a physical exam. Some assessments may be limited, especially if the visit was conducted virtually. If needed, we may recommend a separate in-person follow-up with your provider.  Ongoing Care Seeing your primary care provider every 3 to 6 months helps us  monitor your health and provide consistent, personalized care.   Referrals If a referral was made during today's visit and you haven't received any updates within two weeks, please contact the referred provider directly to check on the status.  Recommended Screenings:  Health Maintenance  Topic Date Due   Flu Shot  08/05/2023   COVID-19 Vaccine (5 - 2025-26 season) 09/05/2023   Hemoglobin A1C  03/02/2024   Breast Cancer Screening  04/12/2024   Yearly kidney function blood test for diabetes  04/25/2024   Yearly kidney health urinalysis for diabetes  04/25/2024   Complete foot exam   04/25/2024   Eye exam for diabetics  09/18/2024   Medicare Annual Wellness Visit  10/24/2024   DEXA scan (bone density measurement)  04/14/2025   Colon Cancer Screening  02/22/2027   Pneumococcal Vaccine for age over 57  Completed   Zoster (Shingles) Vaccine  Completed   Meningitis B Vaccine  Aged Out   DTaP/Tdap/Td vaccine  Discontinued   Hepatitis C Screening  Discontinued       10/25/2023   12:13 PM  Advanced Directives  Does Patient Have a Medical Advance Directive? No   Would patient like information on creating a medical advance directive? No - Patient declined   Advance Care Planning is important because it: Ensures you receive medical care that aligns with your values, goals, and preferences. Provides guidance to your family and loved ones, reducing the emotional burden of decision-making during critical moments.  Vision: Annual vision screenings are recommended for early detection of glaucoma, cataracts, and diabetic retinopathy. These exams can also reveal signs of chronic conditions such as diabetes and high blood pressure.  Dental: Annual dental screenings help detect early signs of oral cancer, gum disease, and other conditions linked to overall health, including heart disease and diabetes.  Please see the attached documents for additional preventive care recommendations.

## 2023-10-25 NOTE — Progress Notes (Signed)
 Subjective:   Amy Bartlett is a 72 y.o. who presents for a Medicare Wellness preventive visit.  As a reminder, Annual Wellness Visits don't include a physical exam, and some assessments may be limited, especially if this visit is performed virtually. We may recommend an in-person follow-up visit with your provider if needed.  Visit Complete: Virtual I connected with  Amy Bartlett on 10/25/23 by a audio enabled telemedicine application and verified that I am speaking with the correct person using two identifiers.  Patient Location: Home  Provider Location: Office/Clinic  I discussed the limitations of evaluation and management by telemedicine. The patient expressed understanding and agreed to proceed.  Vital Signs: Because this visit was a virtual/telehealth visit, some criteria may be missing or patient reported. Any vitals not documented were not able to be obtained and vitals that have been documented are patient reported.  VideoDeclined- This patient declined Librarian, academic. Therefore the visit was completed with audio only.  Persons Participating in Visit: Patient.  AWV Questionnaire: No: Patient Medicare AWV questionnaire was not completed prior to this visit.  Cardiac Risk Factors include: advanced age (>69men, >12 women);dyslipidemia;diabetes mellitus;obesity (BMI >30kg/m2);hypertension     Objective:    Today's Vitals   10/25/23 1203 10/25/23 1204  Weight: 193 lb (87.5 kg)   Height: 5' 2 (1.575 m)   PainSc:  5    Body mass index is 35.3 kg/m.     10/25/2023   12:13 PM 07/05/2022   11:09 AM 11/19/2021   11:00 AM 10/30/2021    9:40 AM 02/02/2021    8:20 PM 10/17/2020   10:23 AM 08/04/2020    8:56 AM  Advanced Directives  Does Patient Have a Medical Advance Directive? No No No No No No No  Would patient like information on creating a medical advance directive? No - Patient declined No - Patient declined No -  Patient declined No - Patient declined  Yes (MAU/Ambulatory/Procedural Areas - Information given) Yes (MAU/Ambulatory/Procedural Areas - Information given)    Current Medications (verified) Outpatient Encounter Medications as of 10/25/2023  Medication Sig   ACCU-CHEK GUIDE test strip USE TO TEST UP TO 4 TIMES A DAY AS DIRECTED   Accu-Chek Softclix Lancets lancets Use as instructed   Blood Glucose Monitoring Suppl (ACCU-CHEK GUIDE ME) w/Device KIT USE UP TO 4 TIMES A DAY AS DIRECTED   Blood Glucose Monitoring Suppl (ACCU-CHEK GUIDE ME) w/Device KIT USE AS DIRECTED   Cholecalciferol 125 MCG (5000 UT) TABS Take by mouth.   diclofenac  Sodium (VOLTAREN ) 1 % GEL Apply 4 g topically 4 (four) times daily as needed.   Dulaglutide  (TRULICITY ) 3 MG/0.5ML SOAJ Inject 3 mg as directed once a week.   empagliflozin  (JARDIANCE ) 25 MG TABS tablet Take 1 tablet (25 mg total) by mouth daily.   glucose blood (ACCU-CHEK GUIDE TEST) test strip Use as instructed   lidocaine (LIDODERM) 5 % SMARTSIG:Patch(s) Topical   lisinopril -hydrochlorothiazide  (ZESTORETIC ) 20-12.5 MG tablet Take 1 tablet by mouth daily.   loratadine  (CLARITIN ) 10 MG tablet Take 1 tablet (10 mg total) by mouth daily.   naproxen (NAPROSYN) 500 MG tablet Take 1 tablet twice a day by oral route.   rosuvastatin  (CRESTOR ) 20 MG tablet TAKE ONE TABLET BY MOUTH DAILY AT BEDTIME   [DISCONTINUED] aspirin 81 MG EC tablet Take by mouth.   [DISCONTINUED] metFORMIN  (GLUCOPHAGE -XR) 750 MG 24 hr tablet Take 1 tablet (750 mg total) by mouth daily with breakfast. (Patient not taking: Reported  on 08/31/2023)   No facility-administered encounter medications on file as of 10/25/2023.    Allergies (verified) Patient has no known allergies.   History: Past Medical History:  Diagnosis Date   Arthritis    Chicken pox    Diabetes mellitus (HCC)    Hyperlipidemia    Hypertension    Hyponatremia - HCTZ 07/15/2020   Osteopenia    Past Surgical History:   Procedure Laterality Date   CARDIAC PACEMAKER PLACEMENT     CESAREAN SECTION  1974, 1977, 1979   REDUCTION MAMMAPLASTY Bilateral 2009   TUBAL LIGATION     Family History  Problem Relation Age of Onset   Glaucoma Mother    Heart disease Mother        Had Pacemaker, Died on CHF    Hypertension Mother    Osteoporosis Mother    Pneumonia Father    Arthritis Father    Diabetes Brother    Stroke Brother    Kidney failure Brother    Healthy Daughter    Healthy Daughter    Social History   Socioeconomic History   Marital status: Married    Spouse name: Not on file   Number of children: Not on file   Years of education: Not on file   Highest education level: Bachelor's degree (e.g., BA, AB, BS)  Occupational History   Not on file  Tobacco Use   Smoking status: Never   Smokeless tobacco: Never  Vaping Use   Vaping status: Never Used  Substance and Sexual Activity   Alcohol  use: Never   Drug use: Never   Sexual activity: Not Currently  Other Topics Concern   Not on file  Social History Narrative   Has 3 children- 2 living in [place]  and 1 in [place]    Social Drivers of Health   Financial Resource Strain: Low Risk  (10/25/2023)   Overall Financial Resource Strain (CARDIA)    Difficulty of Paying Living Expenses: Not hard at all  Food Insecurity: No Food Insecurity (10/25/2023)   Hunger Vital Sign    Worried About Running Out of Food in the Last Year: Never true    Ran Out of Food in the Last Year: Never true  Transportation Needs: No Transportation Needs (10/25/2023)   PRAPARE - Administrator, Civil Service (Medical): No    Lack of Transportation (Non-Medical): No  Physical Activity: Sufficiently Active (10/25/2023)   Exercise Vital Sign    Days of Exercise per Week: 5 days    Minutes of Exercise per Session: 30 min  Stress: No Stress Concern Present (10/25/2023)   [employer] of Occupational Health - Occupational Stress Questionnaire     Feeling of Stress: Not at all  Social Connections: Moderately Integrated (10/25/2023)   Social Connection and Isolation Panel    Frequency of Communication with Friends and Family: More than three times a week    Frequency of Social Gatherings with Friends and Family: More than three times a week    Attends Religious Services: More than 4 times per year    Active Member of [employer] or Organizations: No    Attends Banker Meetings: Never    Marital Status: Married    Tobacco Counseling Counseling given: Not Answered    Clinical Intake:  Pre-visit preparation completed: Yes  Pain : 0-10 Pain Score: 5  Pain Type: Other (Comment) (back surgery) Pain Location: Back Pain Descriptors / Indicators: Aching Pain Onset: More than a month ago Pain  Frequency: Constant     BMI - recorded: 35.3 Nutritional Status: BMI > 30  Obese Nutritional Risks: None Diabetes: Yes CBG done?: No Did pt. bring in CBG monitor from home?: No  Lab Results  Component Value Date   HGBA1C 6.3 (A) 08/31/2023   HGBA1C 7.6 (H) 04/26/2023   HGBA1C 7.2 (A) 10/11/2022     How often do you need to have someone help you when you read instructions, pamphlets, or other written materials from your doctor or pharmacy?: 1 - Never  Interpreter Needed?: No  Information entered by :: Ellouise Haws, LPN   Activities of Daily Living     10/25/2023   12:08 PM 11/03/2022   10:21 PM  In your present state of health, do you have any difficulty performing the following activities:  Hearing? 0 0  Vision? 0 0  Difficulty concentrating or making decisions? 0 0  Walking or climbing stairs? 0 0  Comment and sit for a while   Dressing or bathing? 1 0  Comment sitting at times   Doing errands, shopping? 0 0  Preparing Food and eating ? N N  Using the Toilet? N N  In the past six months, have you accidently leaked urine? N Y  Comment  wears a pad  Do you have problems with loss of bowel control? N N   Managing your Medications? N N  Managing your Finances? N N  Housekeeping or managing your Housekeeping? N N    Patient Care Team: Jodie Lavern CROME, MD as PCP - General (Family Medicine) Octavia Charleston, MD as Consulting Physician (Ophthalmology) Erich Glendia Hacker, MD as Referring Physician (Nephrology) Autry Ozell SAILOR, MD as Referring Physician (Cardiology) Dr. Gigi (Dentistry) Laqueta Ozell BIRCH, MD as Consulting Physician (Orthopedic Surgery) Elsie CHARLENA Burkes MD as Consulting Physician (Orthopedic Surgery)  I have updated your Care Teams any recent Medical Services you may have received from other providers in the past year.     Assessment:   This is a routine wellness examination for [place].  Hearing/Vision screen Hearing Screening - Comments:: Pt denies any hearing issues  Vision Screening - Comments:: Wears rx glasses - up to date with routine eye exams with Dr Octavia    Goals Addressed             This Visit's Progress    Patient Stated       Exercise and lose weight        Depression Screen     10/25/2023   12:11 PM 08/31/2023    9:38 AM 04/26/2023    8:02 AM 11/22/2022    3:30 PM 11/05/2022    8:40 AM 11/04/2022    3:32 PM 10/11/2022    9:46 AM  PHQ 2/9 Scores  PHQ - 2 Score 0 0 0 0 0 0 0  PHQ- 9 Score    0 0 0 1    Fall Risk     10/25/2023   12:13 PM 08/31/2023    9:37 AM 04/26/2023    8:02 AM 11/05/2022    8:40 AM 11/03/2022   10:21 PM  Fall Risk   Falls in the past year? 0 0 0 0 0  Number falls in past yr: 0 0 0 0   Injury with Fall? 0 0 0 0   Risk for fall due to : Impaired mobility;Impaired balance/gait No Fall Risks No Fall Risks No Fall Risks No Fall Risks  Follow up Falls prevention discussed Falls evaluation completed  Falls evaluation completed Falls evaluation completed Falls prevention discussed    MEDICARE RISK AT HOME:  Medicare Risk at Home Any stairs in or around the home?: No If so, are there any without handrails?:  No Home free of loose throw rugs in walkways, pet beds, electrical cords, etc?: Yes Adequate lighting in your home to reduce risk of falls?: Yes Life alert?: No Use of a cane, walker or w/c?: Yes Grab bars in the bathroom?: No Shower chair or bench in shower?: No Elevated toilet seat or a handicapped toilet?: No  TIMED UP AND GO:  Was the test performed?  No  Cognitive Function: 6CIT completed    10/04/2018   12:53 PM  MMSE - Mini Mental State Exam  Orientation to time 5  Orientation to Place 5  Registration 3  Attention/ Calculation 5  Recall 3  Language- name 2 objects 2  Language- repeat 1  Language- follow 3 step command 3  Language- read & follow direction 1  Write a sentence 1  Copy design 1  Total score 30        10/25/2023   12:13 PM 11/04/2022    3:36 PM 10/30/2021    9:43 AM 10/17/2020   10:26 AM  6CIT Screen  What Year? 0 points 0 points 0 points 0 points  What month? 0 points 0 points 0 points 0 points  What time? 0 points 0 points 0 points 0 points  Count back from 20 0 points 0 points 0 points 0 points  Months in reverse 0 points 0 points 0 points 0 points  Repeat phrase 0 points 0 points 0 points 2 points  Total Score 0 points 0 points 0 points 2 points    Immunizations Immunization History  Administered Date(s) Administered   Fluad Quad(high Dose 65+) 09/08/2018, 11/12/2019, 10/17/2020, 10/30/2021   Fluad Trivalent(High Dose 65+) 10/11/2022   PFIZER(Purple Top)SARS-COV-2 Vaccination 02/11/2019, 03/04/2019, 10/15/2019   PNEUMOCOCCAL CONJUGATE-20 12/18/2020   Pfizer Covid-19 Vaccine Bivalent Booster 67yrs & up 11/18/2020   Tdap 04/14/2011   Zoster Recombinant(Shingrix ) 12/18/2020, 02/04/2021   Zoster, Live 04/14/2011    Screening Tests Health Maintenance  Topic Date Due   Influenza Vaccine  08/05/2023   COVID-19 Vaccine (5 - 2025-26 season) 09/05/2023   HEMOGLOBIN A1C  03/02/2024   Mammogram  04/12/2024   Diabetic kidney evaluation -  eGFR measurement  04/25/2024   Diabetic kidney evaluation - Urine ACR  04/25/2024   FOOT EXAM  04/25/2024   OPHTHALMOLOGY EXAM  09/18/2024   Medicare Annual Wellness (AWV)  10/24/2024   DEXA SCAN  04/14/2025   Colonoscopy  02/22/2027   Pneumococcal Vaccine: 50+ Years  Completed   Zoster Vaccines- Shingrix   Completed   Meningococcal B Vaccine  Aged Out   DTaP/Tdap/Td  Discontinued   Hepatitis C Screening  Discontinued    Health Maintenance Items Addressed: Vaccines Due: and discussed , See Nurse Notes at the end of this note  Additional Screening:  Vision Screening: Recommended annual ophthalmology exams for early detection of glaucoma and other disorders of the eye. Is the patient up to date with their annual eye exam?  Yes  Who is the provider or what is the name of the office in which the patient attends annual eye exams? Dr Fredie   Dental Screening: Recommended annual dental exams for proper oral hygiene  Community Resource Referral / Chronic Care Management: CRR required this visit?  No   CCM required this visit?  No  Plan:    I have personally reviewed and noted the following in the patient's chart:   Medical and social history Use of alcohol , tobacco or illicit drugs  Current medications and supplements including opioid prescriptions. Patient is not currently taking opioid prescriptions. Functional ability and status Nutritional status Physical activity Advanced directives List of other physicians Hospitalizations, surgeries, and ER visits in previous 12 months Vitals Screenings to include cognitive, depression, and falls Referrals and appointments  In addition, I have reviewed and discussed with patient certain preventive protocols, quality metrics, and best practice recommendations. A written personalized care plan for preventive services as well as general preventive health recommendations were provided to patient.   Ellouise VEAR Haws, LPN   [id number]    After Visit Summary: (MyChart) Due to this being a telephonic visit, the after visit summary with patients personalized plan was offered to patient via MyChart   Notes: Nothing significant to report at this time.

## 2023-10-27 ENCOUNTER — Ambulatory Visit (HOSPITAL_BASED_OUTPATIENT_CLINIC_OR_DEPARTMENT_OTHER): Admitting: Physical Therapy

## 2023-10-27 DIAGNOSIS — M5459 Other low back pain: Secondary | ICD-10-CM | POA: Diagnosis not present

## 2023-10-27 DIAGNOSIS — R262 Difficulty in walking, not elsewhere classified: Secondary | ICD-10-CM | POA: Diagnosis not present

## 2023-10-27 DIAGNOSIS — M6281 Muscle weakness (generalized): Secondary | ICD-10-CM | POA: Diagnosis not present

## 2023-11-03 DIAGNOSIS — M6281 Muscle weakness (generalized): Secondary | ICD-10-CM | POA: Diagnosis not present

## 2023-11-03 DIAGNOSIS — R262 Difficulty in walking, not elsewhere classified: Secondary | ICD-10-CM | POA: Diagnosis not present

## 2023-11-03 DIAGNOSIS — M5459 Other low back pain: Secondary | ICD-10-CM | POA: Diagnosis not present

## 2023-11-04 DIAGNOSIS — M1611 Unilateral primary osteoarthritis, right hip: Secondary | ICD-10-CM | POA: Diagnosis not present

## 2023-11-04 DIAGNOSIS — M1612 Unilateral primary osteoarthritis, left hip: Secondary | ICD-10-CM | POA: Diagnosis not present

## 2023-11-16 ENCOUNTER — Ambulatory Visit (HOSPITAL_BASED_OUTPATIENT_CLINIC_OR_DEPARTMENT_OTHER): Admitting: Physical Therapy

## 2023-11-17 DIAGNOSIS — Z950 Presence of cardiac pacemaker: Secondary | ICD-10-CM | POA: Diagnosis not present

## 2023-11-17 DIAGNOSIS — I442 Atrioventricular block, complete: Secondary | ICD-10-CM | POA: Diagnosis not present

## 2023-11-18 NOTE — Therapy (Signed)
 OUTPATIENT PHYSICAL THERAPY THORACOLUMBAR EVALUATION   Patient Name: Amy Bartlett MRN: [id number] DOB:17-Oct-1950, 73 y.o., female Today's Date: 11/22/2023  END OF SESSION:  PT End of Session - 11/21/23 1026     Visit Number 1    Number of Visits 16    Date for Recertification  01/16/24    Authorization Type [employer] MCR    PT Start Time 1022    PT Stop Time 1114    PT Time Calculation (min) 52 min    Activity Tolerance Patient tolerated treatment well    Behavior During Therapy WFL for tasks assessed/performed          Past Medical History:  Diagnosis Date   Arthritis    Chicken pox    Diabetes mellitus (HCC)    Hyperlipidemia    Hypertension    Hyponatremia - HCTZ 07/15/2020   Osteopenia    Past Surgical History:  Procedure Laterality Date   CARDIAC PACEMAKER PLACEMENT     CESAREAN SECTION  1974, 1977, 1979   REDUCTION MAMMAPLASTY Bilateral 2009   TUBAL LIGATION     Patient Active Problem List   Diagnosis Date Noted   Gastroesophageal reflux disease 08/31/2023   Peripheral vascular disease with claudication 01/06/2022   Spinal stenosis of lumbar region with neurogenic claudication 01/06/2022   Class 2 severe obesity due to excess calories with serious comorbidity and body mass index (BMI) of 35.0 to 35.9 in adult 03/10/2021   Diabetic peripheral neuropathy associated with type 2 diabetes mellitus (HCC) 12/18/2020   Hyponatremia - HCTZ 07/15/2020   Spondylosis of lumbar region without myelopathy or radiculopathy 07/14/2020   Cardiac pacemaker in situ 03/19/2019   CKD (chronic kidney disease) stage 3, GFR 30-59 ml/min (HCC) 10/04/2016   Obesity (BMI 30-39.9) 01/12/2016   NSVT (nonsustained ventricular tachycardia) 12/05/2015   Primary localized osteoarthrosis of left lower leg 07/16/2014   History of complete heart block 10/04/2012   Combined hyperlipidemia associated with type 2 diabetes mellitus (HCC) 11/04/2011   Type 2 diabetes mellitus with stage  3b chronic kidney disease, without long-term current use of insulin (HCC) 11/04/2011   Age-related osteoporosis without fracture 06/01/2011   Vitamin D  deficiency 04/16/2011   Hypertension associated with diabetes (HCC) 01/11/2011     REFERRING PROVIDER: Laqueta Ozell BIRCH, MD   REFERRING DIAG: M54.50 (ICD-10-CM) - Low back pain, unspecified   Rationale for Evaluation and Treatment: Rehabilitation  THERAPY DIAG:  Other low back pain  Pain in both knees, unspecified chronicity  Muscle weakness (generalized)  Difficulty in walking, not elsewhere classified  ONSET DATE:  chronic pain / Surgery May 2025  SUBJECTIVE:  SUBJECTIVE STATEMENT: Patient had had back pain for years.  Pt states she had degenerative discs.  Pt states she performed aquatic therapy in the 1980's and had significant improvement.  Pt also received aquatic therapy at this location in 2023/2024 which also helped.  Pt underwent L4-5, L5-S1 TLIF in May In [place]  and she thinks it was May 15th.  MD notes indicated pt is feeling improved with regards to her neurogenic claudication and pt verbalized agreement.  Pt received PT in [place]  and she made good improvement.  She recently stopped PT at the end of October because she returned home.  PT order on 8/20 indicated aquatic therapy for low back pain post op.   Pt reports having hip and knee pain with knees being the worst.  Surgeon thought her sx's could be coming from her hips.  Pt has seen orthopedic for her knees and is seeing orthopedic for her hip on Thursday.  MD who saw her for her knees indicated OPPT to work on quadricep  strengthening.  Pt states orthopedic stated she had arthrities.  He ordered PT. Pt has had diagnostic testing for both though PT unable to view them.  MD note on  9/17 indicated preop radicular pain and neurogenic claudication have resolved and her lumbar spine xrays today are satisfactory.    Pt reports she can only stand for 30 mins or less due to pain > fatigue.  Pt is limited with household chores.  Pt has to use support with AD.  Pt is limited with ambulation and shopping.  Pt uses the motorized cart and sometimes she pushes the cart with grocery shopping.  Pt has difficulty dressing.  She has much difficulty lifting LE's to don shoes and socks.  Pt has difficulty with sit/stands including the toilet seat.   Pt has exercises from prior PT though hasn't been performing them lately.   PERTINENT HISTORY:  L4-5, L5-S1 TLIF in May 2025 CARDIAC PACEMAKER--NO E-STIM Bilat knee pain--Pt states she was informed she has arthritis DM type 2, peripheral neuropathy HTN Osteopenia/osteoporosis CKD  PAIN:  NPRS:  0/10 current, 8/10 worst Location:  bilat knees and hip, lower lumbar Pt states she gets cramps in the bed.  Pt denies radicular sx's.   PRECAUTIONS: ICD/Pacemaker and Other: lumbar fusion and osteopenia/osteoporosis.    WEIGHT BEARING RESTRICTIONS: No Wb'ing restrictions.  Pt thinks she has a lifting restriction, but not sure about how much.    FALLS:  Has patient fallen in last 6 months? No  LIVING ENVIRONMENT: Lives with: lives with their spouse Lives in: 1 story home Stairs: 4 steps with 1 rail Has following equipment at home: cane, rollator, shower chair, FWW  OCCUPATION: Retired  PLOF: Independent.  Pt ambulated without an AD.   PATIENT GOALS: strengthen LE's, improve mobility   OBJECTIVE:  Note: Objective measures were completed at Evaluation unless otherwise noted.  DIAGNOSTIC FINDINGS:  Pt is post op.  MD note indicated lumbar spine xrays today are satisfactory.     Pt has received diagnostic testing for hips and knees though PT unable to view them.  PATIENT SURVEYS:  Modified Oswestry Disability Index:  23 =  46%  COGNITION: Overall cognitive status: Within functional limits for tasks assessed     SENSATION: L2-S2:  2+ to LT bilat   OBSERVATION: Incision has healed well.  Incision closed and dry and has no redness.    PALPATION: Assess next visit   LOWER EXTREMITY MMT:    MMT Right eval  Left eval  Hip flexion 15.4 13.3  Hip extension    Hip abduction 11.4 15.0  Hip adduction    Hip internal rotation    Hip external rotation    Knee flexion Tol min resistance 5/5 seated  Knee extension 4-/5 4-/5  Ankle dorsiflexion 5/5 5/5  Ankle plantarflexion weak weak  Ankle inversion    Ankle eversion     (Blank rows = not tested)   FUNCTIONAL TESTS:  Pt is very slow with sit to stand transfers.  Pt unable to stand without using Ue's. 5x STS test:  6 sec with UE's  GAIT: Comments: very slow with rollator.    TREATMENT:                                                                                                                                  PATIENT EDUCATION:  Education details: dx, objective findings, rationale of interventions, POC, and relevant anatomy.  PT answered pt's questions.   Person educated: Patient Education method: Explanation Education comprehension: verbalized understanding  HOME EXERCISE PROGRAM: Pt has a HEP from prior PT.  Will give at a later visit.   ASSESSMENT:  CLINICAL IMPRESSION: Patient is a 73 y.o. female 6 months s/p L4-5, L5-S1 TLIF.  Pt has a hx of chronic back pain and has received aquatic therapy multiple times with good response.  Pt has much improved LE sx's since having fusion.  Pt received land based PT in [place]  after fusion.  She has exercises from prior PT though hasn't been performing them lately.  Pt is having hip and knee pain with knees being the worst.  Pt has seen orthopedic for her knees and is seeing orthopedic for her hip on Thursday.  Pt reports she can only stand for 30 mins or less due to pain > fatigue.  Pt is  ambulating with rollator.  She is limited with ambulation, shopping, and household chores.  Pt uses the motorized cart and sometimes she pushes the cart with grocery shopping.  She has much difficulty lifting LE's to don shoes and socks.  Pt has difficulty with sit/stand transfers and it required her much increased time to perform 5x STS test.   She has muscle weakness in bilat LE's.  She should benefit from skilled PT to address impairments and improve overall function.     OBJECTIVE IMPAIRMENTS: Abnormal gait, decreased activity tolerance, decreased balance, decreased endurance, decreased mobility, difficulty walking, decreased strength, and pain.   ACTIVITY LIMITATIONS: carrying, lifting, standing, sleeping, stairs, transfers, dressing, and locomotion level  PARTICIPATION LIMITATIONS: cleaning, shopping, and community activity  PERSONAL FACTORS: Time since onset of injury/illness/exacerbation and 3+ comorbidities: Pacemaker, osteopenia/osteoporosis, knee pain, arthritis, DM type 2, peripheral neuropathy are also affecting patient's functional outcome.   REHAB POTENTIAL: Good  CLINICAL DECISION MAKING: Stable/uncomplicated  EVALUATION COMPLEXITY: Low   GOALS:  SHORT TERM GOALS: Target date: 12/19/2023   Pt will tolerate aquatic  therapy without adverse effects for improved strength, mobility, pain, function, and tolerance to activity.  Baseline: Goal status: INITIAL  2.  Pt will perform 5x STS test in no > 50 sec for improved functional LE strength and performance of transfers.  Baseline:  Goal status: INITIAL  3.  Pt will report at least a 25% improvement overall in back and bilat knee pain.  Baseline:  Goal status: INITIAL  4.  Pt will report she is able to perform her daily transfers with no > than minimal difficulty.   Baseline:  Goal status: INITIAL Target date:  12/26/2023    5.  Pt will report at least a 50% improvement in dressing.  Baseline:  Goal status:  INITIAL    LONG TERM GOALS: Target date: 01/16/2024   Pt will be able to ambulate extended community distance including performing grocery shopping without significant pain and difficulty.   Baseline:  Goal status: INITIAL  2.  Pt will report she is able to perform her daily transfers without difficulty.  Baseline:  Goal status: INITIAL  3.  Pt will perform 5x STS test in no > 20 sec for improved functional LE strength and performance of transfers.  Baseline:  Goal status: INITIAL  4.  Pt will demo improved bilat hip flexion strength by 5 lbs and hip abd by 10 lbs and knee extension to 5/5 MMT for improved performance of and tolerance with functional mobility.  Baseline:  Goal status: INITIAL  5.  Pt will be independent with pool program and land based HEP for improved pain, strength, and functional mobility.  Baseline:  Goal status: INITIAL   PLAN:  PT FREQUENCY: 2x/week  PT DURATION: 8 weeks  PLANNED INTERVENTIONS: [id number]- PT Re-evaluation, 97750- Physical Performance Testing, 97110-Therapeutic exercises, 97530- Therapeutic activity, [id number]- Neuromuscular re-education, 97535- Self Care, [id number]- Manual therapy, [phone]- Gait training, [phone]- Aquatic Therapy, Patient/Family education, Stair training, Taping, Scar mobilization, DME instructions, Cryotherapy, and Moist heat.  PLAN FOR NEXT SESSION:  Review current HEP.  Assess tenderness to palpation and soft tissue tightness next visit.  Cont with aquatic therapy.   CARDIAC PACEMAKER--NO E-STIM   Leigh Minerva III PT, DPT 11/22/23 5:20 PM     Referring diagnosis? M54.50 (ICD-10-CM) - Low back pain, unspecified    Treatment diagnosis? (if different than referring diagnosis)  Other low back pain  Pain in both knees, unspecified chronicity  Muscle weakness (generalized)  Difficulty in walking, not elsewhere classified What was this (referring dx) caused by? [x]  Surgery []  Fall [x]  Ongoing issue [x]  Arthritis []   Other: ____________  Laterality: []  Rt []  Lt [x]  Both  Check all possible CPT codes:  *CHOOSE 10 OR LESS*    See Planned Interventions listed in the Plan section of the Evaluation.

## 2023-11-21 ENCOUNTER — Encounter (HOSPITAL_BASED_OUTPATIENT_CLINIC_OR_DEPARTMENT_OTHER): Payer: Self-pay | Admitting: Physical Therapy

## 2023-11-21 ENCOUNTER — Ambulatory Visit (HOSPITAL_BASED_OUTPATIENT_CLINIC_OR_DEPARTMENT_OTHER): Attending: Anesthesiology | Admitting: Physical Therapy

## 2023-11-21 ENCOUNTER — Other Ambulatory Visit: Payer: Self-pay

## 2023-11-21 DIAGNOSIS — M5459 Other low back pain: Secondary | ICD-10-CM | POA: Insufficient documentation

## 2023-11-21 DIAGNOSIS — M6281 Muscle weakness (generalized): Secondary | ICD-10-CM | POA: Diagnosis not present

## 2023-11-21 DIAGNOSIS — M25562 Pain in left knee: Secondary | ICD-10-CM | POA: Diagnosis not present

## 2023-11-21 DIAGNOSIS — R262 Difficulty in walking, not elsewhere classified: Secondary | ICD-10-CM | POA: Insufficient documentation

## 2023-11-21 DIAGNOSIS — M25561 Pain in right knee: Secondary | ICD-10-CM | POA: Insufficient documentation

## 2023-11-22 ENCOUNTER — Encounter: Payer: Self-pay | Admitting: Podiatry

## 2023-11-22 ENCOUNTER — Ambulatory Visit (INDEPENDENT_AMBULATORY_CARE_PROVIDER_SITE_OTHER): Admitting: Podiatry

## 2023-11-22 DIAGNOSIS — B351 Tinea unguium: Secondary | ICD-10-CM

## 2023-11-22 DIAGNOSIS — M79675 Pain in left toe(s): Secondary | ICD-10-CM

## 2023-11-22 DIAGNOSIS — M79674 Pain in right toe(s): Secondary | ICD-10-CM | POA: Diagnosis not present

## 2023-11-22 DIAGNOSIS — E1142 Type 2 diabetes mellitus with diabetic polyneuropathy: Secondary | ICD-10-CM | POA: Diagnosis not present

## 2023-11-28 ENCOUNTER — Encounter (HOSPITAL_BASED_OUTPATIENT_CLINIC_OR_DEPARTMENT_OTHER): Payer: Self-pay | Admitting: Physical Therapy

## 2023-11-28 ENCOUNTER — Ambulatory Visit (HOSPITAL_BASED_OUTPATIENT_CLINIC_OR_DEPARTMENT_OTHER): Admitting: Physical Therapy

## 2023-11-28 DIAGNOSIS — R262 Difficulty in walking, not elsewhere classified: Secondary | ICD-10-CM

## 2023-11-28 DIAGNOSIS — M25561 Pain in right knee: Secondary | ICD-10-CM

## 2023-11-28 DIAGNOSIS — M25562 Pain in left knee: Secondary | ICD-10-CM | POA: Diagnosis not present

## 2023-11-28 DIAGNOSIS — M5459 Other low back pain: Secondary | ICD-10-CM

## 2023-11-28 DIAGNOSIS — M6281 Muscle weakness (generalized): Secondary | ICD-10-CM

## 2023-11-28 NOTE — Therapy (Signed)
 OUTPATIENT PHYSICAL THERAPY THORACOLUMBAR EVALUATION   Patient Name: Amy Bartlett MRN: [id number] DOB:December 11, 1950, 73 y.o., female Today's Date: 11/28/2023  END OF SESSION:  PT End of Session - 11/28/23 1546     Visit Number 2    Number of Visits 16    Date for Recertification  01/16/24    Authorization Type [employer] MCR    PT Start Time 1531    PT Stop Time 1610    PT Time Calculation (min) 39 min    Activity Tolerance Patient tolerated treatment well    Behavior During Therapy [facility] for tasks assessed/performed           Past Medical History:  Diagnosis Date   Arthritis    Chicken pox    Diabetes mellitus (HCC)    Hyperlipidemia    Hypertension    Hyponatremia - HCTZ 07/15/2020   Osteopenia    Past Surgical History:  Procedure Laterality Date   CARDIAC PACEMAKER PLACEMENT     CESAREAN SECTION  1974, 1977, 1979   REDUCTION MAMMAPLASTY Bilateral 2009   TUBAL LIGATION     Patient Active Problem List   Diagnosis Date Noted   Gastroesophageal reflux disease 08/31/2023   Peripheral vascular disease with claudication 01/06/2022   Spinal stenosis of lumbar region with neurogenic claudication 01/06/2022   Class 2 severe obesity due to excess calories with serious comorbidity and body mass index (BMI) of 35.0 to 35.9 in adult 03/10/2021   Diabetic peripheral neuropathy associated with type 2 diabetes mellitus (HCC) 12/18/2020   Hyponatremia - HCTZ 07/15/2020   Spondylosis of lumbar region without myelopathy or radiculopathy 07/14/2020   Cardiac pacemaker in situ 03/19/2019   CKD (chronic kidney disease) stage 3, GFR 30-59 ml/min (HCC) 10/04/2016   Obesity (BMI 30-39.9) 01/12/2016   NSVT (nonsustained ventricular tachycardia) 12/05/2015   Primary localized osteoarthrosis of left lower leg 07/16/2014   History of complete heart block 10/04/2012   Combined hyperlipidemia associated with type 2 diabetes mellitus (HCC) 11/04/2011   Type 2 diabetes mellitus with  stage 3b chronic kidney disease, without long-term current use of insulin (HCC) 11/04/2011   Age-related osteoporosis without fracture 06/01/2011   Vitamin D  deficiency 04/16/2011   Hypertension associated with diabetes (HCC) 01/11/2011     REFERRING PROVIDER: Laqueta Ozell BIRCH, MD   REFERRING DIAG: M54.50 (ICD-10-CM) - Low back pain, unspecified   Rationale for Evaluation and Treatment: Rehabilitation  THERAPY DIAG:  Other low back pain  Pain in both knees, unspecified chronicity  Muscle weakness (generalized)  Difficulty in walking, not elsewhere classified  ONSET DATE:  chronic pain / Surgery May 2025  SUBJECTIVE:  SUBJECTIVE STATEMENT: No changes. Leaving on vacation tomorrow will be back on dec 2. Pain achy LB 2/10   Initial Subjective Patient had had back pain for years.  Pt states she had degenerative discs.  Pt states she performed aquatic therapy in the 1980's and had significant improvement.  Pt also received aquatic therapy at this location in 2023/2024 which also helped.  Pt underwent L4-5, L5-S1 TLIF in May In [place]  and she thinks it was May 15th.  MD notes indicated pt is feeling improved with regards to her neurogenic claudication and pt verbalized agreement.  Pt received PT in [place]  and she made good improvement.  She recently stopped PT at the end of October because she returned home.  PT order on 8/20 indicated aquatic therapy for low back pain post op.   Pt reports having hip and knee pain with knees being the worst.  Surgeon thought her sx's could be coming from her hips.  Pt has seen orthopedic for her knees and is seeing orthopedic for her hip on Thursday.  MD who saw her for her knees indicated OPPT to work on quadricep  strengthening.  Pt states orthopedic stated she had  arthrities.  He ordered PT. Pt has had diagnostic testing for both though PT unable to view them.  MD note on 9/17 indicated preop radicular pain and neurogenic claudication have resolved and her lumbar spine xrays today are satisfactory.    Pt reports she can only stand for 30 mins or less due to pain > fatigue.  Pt is limited with household chores.  Pt has to use support with AD.  Pt is limited with ambulation and shopping.  Pt uses the motorized cart and sometimes she pushes the cart with grocery shopping.  Pt has difficulty dressing.  She has much difficulty lifting LE's to don shoes and socks.  Pt has difficulty with sit/stands including the toilet seat.   Pt has exercises from prior PT though hasn't been performing them lately.   PERTINENT HISTORY:  L4-5, L5-S1 TLIF in May 2025 CARDIAC PACEMAKER--NO E-STIM Bilat knee pain--Pt states she was informed she has arthritis DM type 2, peripheral neuropathy HTN Osteopenia/osteoporosis CKD  PAIN:  NPRS:  0/10 current, 8/10 worst Location:  bilat knees and hip, lower lumbar Pt states she gets cramps in the bed.  Pt denies radicular sx's.   PRECAUTIONS: ICD/Pacemaker and Other: lumbar fusion and osteopenia/osteoporosis.    WEIGHT BEARING RESTRICTIONS: No Wb'ing restrictions.  Pt thinks she has a lifting restriction, but not sure about how much.    FALLS:  Has patient fallen in last 6 months? No  LIVING ENVIRONMENT: Lives with: lives with their spouse Lives in: 1 story home Stairs: 4 steps with 1 rail Has following equipment at home: cane, rollator, shower chair, FWW  OCCUPATION: Retired  PLOF: Independent.  Pt ambulated without an AD.   PATIENT GOALS: strengthen LE's, improve mobility   OBJECTIVE:  Note: Objective measures were completed at Evaluation unless otherwise noted.  DIAGNOSTIC FINDINGS:  Pt is post op.  MD note indicated lumbar spine xrays today are satisfactory.     Pt has received diagnostic testing for hips and  knees though PT unable to view them.  PATIENT SURVEYS:  Modified Oswestry Disability Index:  23 = 46%  COGNITION: Overall cognitive status: Within functional limits for tasks assessed     SENSATION: L2-S2:  2+ to LT bilat   OBSERVATION: Incision has healed well.  Incision closed and dry and has  no redness.    PALPATION: Assess next visit   LOWER EXTREMITY MMT:    MMT Right eval Left eval  Hip flexion 15.4 13.3  Hip extension    Hip abduction 11.4 15.0  Hip adduction    Hip internal rotation    Hip external rotation    Knee flexion Tol min resistance 5/5 seated  Knee extension 4-/5 4-/5  Ankle dorsiflexion 5/5 5/5  Ankle plantarflexion weak weak  Ankle inversion    Ankle eversion     (Blank rows = not tested)   FUNCTIONAL TESTS:  Pt is very slow with sit to stand transfers.  Pt unable to stand without using Ue's. 5x STS test:  6 sec with UE's  GAIT: Comments: very slow with rollator.    TREATMENT:                                                                                                                               [facility] Adult PT Treatment:                                                DATE: 11/28/23 Pt seen for aquatic therapy today.  Treatment took place in water 3.5-4.75 ft in depth at the [facility] pool. Temp of water was 91.  Pt entered/exited the pool via stairs with cl supervision and  hand rail.  *Intro to setting *walking forward, back and side stepping in 3.6  ft with ue support of barbell  - seated rest period *seated on lift: cycling; hip add/abd; LAQ *Ue support on wall: toe raises; heel raises;  *L stretch CGA with hands on deck for pt confidence. *walking forward/backward 4.0 ft ue support barbell. Pt reports reduced LB discomfort with increased submersion.     Pt requires the buoyancy and hydrostatic pressure of water for support, and to offload joints by unweighting joint load by at least 50 % in navel deep water and  by at least 75-80% in chest to neck deep water.  Viscosity of the water is needed for resistance of strengthening. Water current perturbations provides challenge to standing balance requiring increased core activation.      PATIENT EDUCATION:  Education details: dx, objective findings, rationale of interventions, POC, and relevant anatomy.  PT answered pt's questions.   Person educated: Patient Education method: Explanation Education comprehension: verbalized understanding  HOME EXERCISE PROGRAM: Pt has a HEP from prior PT.  Will give at a later visit.   ASSESSMENT:  CLINICAL IMPRESSION: Pt demonstrates safety and independence in aquatic setting with therapist instructing from deck. She is confident in setting, moving through 3.6-4.0 ft easily.  Pt is directed through various movement patterns and trials in both sitting and standing positions.   She is provided VC, demonstration and minor CGA-sup throughout session for  execution of exercises while monitoring toleration.She reports being sleepy today and is given cues to avoid over relaxation. Saw MD in regards to hip and she is being sent for an injection not scheduled as of yet. She is a good candidate for aquatic intervention and will benefit from the properties of water to progress towards functional goals.     Initial Impression Patient is a 73 y.o. female 6 months s/p L4-5, L5-S1 TLIF.  Pt has a hx of chronic back pain and has received aquatic therapy multiple times with good response.  Pt has much improved LE sx's since having fusion.  Pt received land based PT in [place]  after fusion.  She has exercises from prior PT though hasn't been performing them lately.  Pt is having hip and knee pain with knees being the worst.  Pt has seen orthopedic for her knees and is seeing orthopedic for her hip on Thursday.  Pt reports she can only stand for 30 mins or less due to pain > fatigue.  Pt is ambulating with rollator.  She is limited with  ambulation, shopping, and household chores.  Pt uses the motorized cart and sometimes she pushes the cart with grocery shopping.  She has much difficulty lifting LE's to don shoes and socks.  Pt has difficulty with sit/stand transfers and it required her much increased time to perform 5x STS test.   She has muscle weakness in bilat LE's.  She should benefit from skilled PT to address impairments and improve overall function.     OBJECTIVE IMPAIRMENTS: Abnormal gait, decreased activity tolerance, decreased balance, decreased endurance, decreased mobility, difficulty walking, decreased strength, and pain.   ACTIVITY LIMITATIONS: carrying, lifting, standing, sleeping, stairs, transfers, dressing, and locomotion level  PARTICIPATION LIMITATIONS: cleaning, shopping, and community activity  PERSONAL FACTORS: Time since onset of injury/illness/exacerbation and 3+ comorbidities: Pacemaker, osteopenia/osteoporosis, knee pain, arthritis, DM type 2, peripheral neuropathy are also affecting patient's functional outcome.   REHAB POTENTIAL: Good  CLINICAL DECISION MAKING: Stable/uncomplicated  EVALUATION COMPLEXITY: Low   GOALS:  SHORT TERM GOALS: Target date: 12/19/2023   Pt will tolerate aquatic therapy without adverse effects for improved strength, mobility, pain, function, and tolerance to activity.  Baseline: Goal status: INITIAL  2.  Pt will perform 5x STS test in no > 50 sec for improved functional LE strength and performance of transfers.  Baseline:  Goal status: INITIAL  3.  Pt will report at least a 25% improvement overall in back and bilat knee pain.  Baseline:  Goal status: INITIAL  4.  Pt will report she is able to perform her daily transfers with no > than minimal difficulty.   Baseline:  Goal status: INITIAL Target date:  12/26/2023    5.  Pt will report at least a 50% improvement in dressing.  Baseline:  Goal status: INITIAL    LONG TERM GOALS: Target date:  01/16/2024   Pt will be able to ambulate extended community distance including performing grocery shopping without significant pain and difficulty.   Baseline:  Goal status: INITIAL  2.  Pt will report she is able to perform her daily transfers without difficulty.  Baseline:  Goal status: INITIAL  3.  Pt will perform 5x STS test in no > 20 sec for improved functional LE strength and performance of transfers.  Baseline:  Goal status: INITIAL  4.  Pt will demo improved bilat hip flexion strength by 5 lbs and hip abd by 10 lbs and knee extension to 5/5  MMT for improved performance of and tolerance with functional mobility.  Baseline:  Goal status: INITIAL  5.  Pt will be independent with pool program and land based HEP for improved pain, strength, and functional mobility.  Baseline:  Goal status: INITIAL   PLAN:  PT FREQUENCY: 2x/week  PT DURATION: 8 weeks  PLANNED INTERVENTIONS: [id number]- PT Re-evaluation, 97750- Physical Performance Testing, 97110-Therapeutic exercises, 97530- Therapeutic activity, [id number]- Neuromuscular re-education, 97535- Self Care, [id number]- Manual therapy, [phone]- Gait training, [phone]- Aquatic Therapy, Patient/Family education, Stair training, Taping, Scar mobilization, DME instructions, Cryotherapy, and Moist heat.  PLAN FOR NEXT SESSION:  Review current HEP.  Assess tenderness to palpation and soft tissue tightness next visit.  Cont with aquatic therapy.   CARDIAC PACEMAKER--NO E-STIM   Ronal Torrington) Jaja Switalski MPT 11/28/23 3:59 PM [facility] [address] [place], [place], [address] Phone: [phone]   Fax:  [phone]      Referring diagnosis? M54.50 (ICD-10-CM) - Low back pain, unspecified    Treatment diagnosis? (if different than referring diagnosis)  Other low back pain  Pain in both knees, unspecified chronicity  Muscle weakness (generalized)  Difficulty in walking, not elsewhere  classified What was this (referring dx) caused by? [x]  Surgery []  Fall [x]  Ongoing issue [x]  Arthritis []  Other: ____________  Laterality: []  Rt []  Lt [x]  Both  Check all possible CPT codes:  *CHOOSE 10 OR LESS*    See Planned Interventions listed in the Plan section of the Evaluation.

## 2023-11-28 NOTE — Progress Notes (Signed)
  Subjective:  Patient ID: Amy Bartlett, female    DOB: 02/09/1950,  MRN: [id number]  Amy Bartlett presents to clinic today for preventative diabetic foot care for painful mycotic toenails of both feet that are difficult to trim. Pain interferes with daily activities and wearing enclosed shoe gear comfortably.  Chief Complaint  Patient presents with   Diabetes    Dr. Jodie is her PCP. A1c 6.7. Last visit was in August   New problem(s): None.   PCP is Jodie Lavern CROME, MD.  No Known Allergies  Review of Systems: Negative except as noted in the HPI.  Objective: No changes noted in today's physical examination. There were no vitals filed for this visit. Amy Bartlett is a pleasant 73 y.o. female WD, WN in NAD. AAO x 3.  Vascular Examination: Vascular status intact b/l with palpable pedal pulses. CFT immediate b/l. No edema. No pain with calf compression b/l. Skin temperature gradient WNL b/l. No ischemia or gangrene noted b/l LE. No cyanosis or clubbing noted b/l LE.  Neurological Examination: Pt has subjective symptoms of neuropathy. Sensation grossly intact b/l with 10 gram monofilament. Vibratory sensation intact b/l.   Dermatological Examination: Pedal skin with normal turgor, texture and tone b/l. Toenails 1-5 b/l thick, discolored, elongated with subungual debris and pain on dorsal palpation. No hyperkeratotic lesions noted b/l.   Musculoskeletal Examination: Muscle strength 5/5 to b/l LE. HAV with bunion bilaterally and hammertoes 2-5 b/l.  Radiographs: None  Assessment/Plan: 1. Pain due to onychomycosis of toenails of both feet   2. Diabetic peripheral neuropathy associated with type 2 diabetes mellitus (HCC)   Patient was evaluated and treated. All patient's and/or POA's questions/concerns addressed on today's visit. Toenails 1-5 b/l debrided in length and girth without incident. Continue foot and shoe inspections daily. Monitor blood glucose  per PCP/Endocrinologist's recommendations. Continue soft, supportive shoe gear daily. Report any pedal injuries to medical professional. Call office if there are any questions/concerns. -Patient/POA to call should there be question/concern in the interim.   Return in about 3 months (around 02/22/2024).  Delon CROME Merlin, DPM      [facility] LOCATION: [address], [place] [address]                   Office [phone]   [facility] LOCATION: [address] [place], [place] [address] Office [phone]

## 2023-12-08 ENCOUNTER — Encounter: Payer: Self-pay | Admitting: Family Medicine

## 2023-12-08 ENCOUNTER — Ambulatory Visit (HOSPITAL_BASED_OUTPATIENT_CLINIC_OR_DEPARTMENT_OTHER): Admitting: Physical Therapy

## 2023-12-08 ENCOUNTER — Ambulatory Visit: Admitting: Family Medicine

## 2023-12-08 ENCOUNTER — Encounter (HOSPITAL_BASED_OUTPATIENT_CLINIC_OR_DEPARTMENT_OTHER): Payer: Self-pay | Admitting: Physical Therapy

## 2023-12-08 VITALS — BP 116/70 | HR 83 | Temp 97.7°F | Ht 62.0 in | Wt 198.2 lb

## 2023-12-08 DIAGNOSIS — M25561 Pain in right knee: Secondary | ICD-10-CM | POA: Insufficient documentation

## 2023-12-08 DIAGNOSIS — Z7985 Long-term (current) use of injectable non-insulin antidiabetic drugs: Secondary | ICD-10-CM | POA: Diagnosis not present

## 2023-12-08 DIAGNOSIS — M6281 Muscle weakness (generalized): Secondary | ICD-10-CM | POA: Diagnosis present

## 2023-12-08 DIAGNOSIS — M5459 Other low back pain: Secondary | ICD-10-CM | POA: Diagnosis present

## 2023-12-08 DIAGNOSIS — N1832 Chronic kidney disease, stage 3b: Secondary | ICD-10-CM | POA: Diagnosis not present

## 2023-12-08 DIAGNOSIS — R262 Difficulty in walking, not elsewhere classified: Secondary | ICD-10-CM | POA: Diagnosis present

## 2023-12-08 DIAGNOSIS — E1122 Type 2 diabetes mellitus with diabetic chronic kidney disease: Secondary | ICD-10-CM | POA: Diagnosis not present

## 2023-12-08 DIAGNOSIS — M25562 Pain in left knee: Secondary | ICD-10-CM | POA: Diagnosis present

## 2023-12-08 DIAGNOSIS — I152 Hypertension secondary to endocrine disorders: Secondary | ICD-10-CM | POA: Diagnosis not present

## 2023-12-08 DIAGNOSIS — Z23 Encounter for immunization: Secondary | ICD-10-CM | POA: Diagnosis not present

## 2023-12-08 DIAGNOSIS — E1159 Type 2 diabetes mellitus with other circulatory complications: Secondary | ICD-10-CM

## 2023-12-08 DIAGNOSIS — M160 Bilateral primary osteoarthritis of hip: Secondary | ICD-10-CM | POA: Diagnosis not present

## 2023-12-08 DIAGNOSIS — M818 Other osteoporosis without current pathological fracture: Secondary | ICD-10-CM

## 2023-12-08 NOTE — Therapy (Signed)
 OUTPATIENT PHYSICAL THERAPY THORACOLUMBAR TREATMENT    Patient Name: Amy Bartlett MRN: [id number] DOB:01/06/1950, 73 y.o., female Today's Date: 12/08/2023  END OF SESSION:  PT End of Session - 12/08/23 1500     Visit Number 3    Number of Visits 16    Date for Recertification  01/16/24    Authorization Type [employer] MCR    PT Start Time 1455    PT Stop Time 1533    PT Time Calculation (min) 38 min    Activity Tolerance Patient tolerated treatment well    Behavior During Therapy WFL for tasks assessed/performed           Past Medical History:  Diagnosis Date   Arthritis    Chicken pox    Diabetes mellitus (HCC)    Hyperlipidemia    Hypertension    Hyponatremia - HCTZ 07/15/2020   Osteopenia    Past Surgical History:  Procedure Laterality Date   CARDIAC PACEMAKER PLACEMENT     CESAREAN SECTION  1974, 1977, 1979   REDUCTION MAMMAPLASTY Bilateral 2009   TUBAL LIGATION     Patient Active Problem List   Diagnosis Date Noted   Bilateral primary osteoarthritis of hip 12/08/2023   Gastroesophageal reflux disease 08/31/2023   Peripheral vascular disease with claudication 01/06/2022   Spinal stenosis of lumbar region with neurogenic claudication 01/06/2022   Class 2 severe obesity due to excess calories with serious comorbidity and body mass index (BMI) of 35.0 to 35.9 in adult 03/10/2021   Diabetic peripheral neuropathy associated with type 2 diabetes mellitus (HCC) 12/18/2020   Hyponatremia - HCTZ 07/15/2020   Spondylosis of lumbar region without myelopathy or radiculopathy 07/14/2020   Cardiac pacemaker in situ 03/19/2019   CKD (chronic kidney disease) stage 3, GFR 30-59 ml/min (HCC) 10/04/2016   Obesity (BMI 30-39.9) 01/12/2016   NSVT (nonsustained ventricular tachycardia) 12/05/2015   Primary localized osteoarthrosis of left lower leg 07/16/2014   History of complete heart block 10/04/2012   Combined hyperlipidemia associated with type 2 diabetes mellitus  (HCC) 11/04/2011   Type 2 diabetes mellitus with stage 3b chronic kidney disease, without long-term current use of insulin (HCC) 11/04/2011   Age-related osteoporosis without fracture 06/01/2011   Vitamin D  deficiency 04/16/2011   Hypertension associated with diabetes (HCC) 01/11/2011     REFERRING PROVIDER: Laqueta Ozell BIRCH, MD   REFERRING DIAG: M54.50 (ICD-10-CM) - Low back pain, unspecified   Rationale for Evaluation and Treatment: Rehabilitation  THERAPY DIAG:  Other low back pain  Pain in both knees, unspecified chronicity  Muscle weakness (generalized)  ONSET DATE:  chronic pain / Surgery May 2025  SUBJECTIVE:  SUBJECTIVE STATEMENT: Knee pain 3-4/10.  LB OK.  Good response to initial aquatic session with reduction in pain for a few hours.  Had a good vacation.  Was not limited in any activity due to pain.  Used my walker   Initial Subjective Patient had had back pain for years.  Pt states she had degenerative discs.  Pt states she performed aquatic therapy in the 1980's and had significant improvement.  Pt also received aquatic therapy at this location in 2023/2024 which also helped.  Pt underwent L4-5, L5-S1 TLIF in May In [place]  and she thinks it was May 15th.  MD notes indicated pt is feeling improved with regards to her neurogenic claudication and pt verbalized agreement.  Pt received PT in [place]  and she made good improvement.  She recently stopped PT at the end of October because she returned home.  PT order on 8/20 indicated aquatic therapy for low back pain post op.   Pt reports having hip and knee pain with knees being the worst.  Surgeon thought her sx's could be coming from her hips.  Pt has seen orthopedic for her knees and is seeing orthopedic for her hip on Thursday.  MD  who saw her for her knees indicated OPPT to work on quadricep  strengthening.  Pt states orthopedic stated she had arthrities.  He ordered PT. Pt has had diagnostic testing for both though PT unable to view them.  MD note on 9/17 indicated preop radicular pain and neurogenic claudication have resolved and her lumbar spine xrays today are satisfactory.    Pt reports she can only stand for 30 mins or less due to pain > fatigue.  Pt is limited with household chores.  Pt has to use support with AD.  Pt is limited with ambulation and shopping.  Pt uses the motorized cart and sometimes she pushes the cart with grocery shopping.  Pt has difficulty dressing.  She has much difficulty lifting LE's to don shoes and socks.  Pt has difficulty with sit/stands including the toilet seat.   Pt has exercises from prior PT though hasn't been performing them lately.   PERTINENT HISTORY:  L4-5, L5-S1 TLIF in May 2025 CARDIAC PACEMAKER--NO E-STIM Bilat knee pain--Pt states she was informed she has arthritis DM type 2, peripheral neuropathy HTN Osteopenia/osteoporosis CKD  PAIN:  NPRS:  0/10 current, 8/10 worst Location:  bilat knees and hip, lower lumbar Pt states she gets cramps in the bed.  Pt denies radicular sx's.   PRECAUTIONS: ICD/Pacemaker and Other: lumbar fusion and osteopenia/osteoporosis.    WEIGHT BEARING RESTRICTIONS: No Wb'ing restrictions.  Pt thinks she has a lifting restriction, but not sure about how much.    FALLS:  Has patient fallen in last 6 months? No  LIVING ENVIRONMENT: Lives with: lives with their spouse Lives in: 1 story home Stairs: 4 steps with 1 rail Has following equipment at home: cane, rollator, shower chair, FWW  OCCUPATION: Retired  PLOF: Independent.  Pt ambulated without an AD.   PATIENT GOALS: strengthen LE's, improve mobility   OBJECTIVE:  Note: Objective measures were completed at Evaluation unless otherwise noted.  DIAGNOSTIC FINDINGS:  Pt is post op.   MD note indicated lumbar spine xrays today are satisfactory.     Pt has received diagnostic testing for hips and knees though PT unable to view them.  PATIENT SURVEYS:  Modified Oswestry Disability Index:  23 = 46%  COGNITION: Overall cognitive status: Within functional limits for tasks assessed  SENSATION: L2-S2:  2+ to LT bilat   OBSERVATION: Incision has healed well.  Incision closed and dry and has no redness.    PALPATION: Assess next visit   LOWER EXTREMITY MMT:    MMT Right eval Left eval  Hip flexion 15.4 13.3  Hip extension    Hip abduction 11.4 15.0  Hip adduction    Hip internal rotation    Hip external rotation    Knee flexion Tol min resistance 5/5 seated  Knee extension 4-/5 4-/5  Ankle dorsiflexion 5/5 5/5  Ankle plantarflexion weak weak  Ankle inversion    Ankle eversion     (Blank rows = not tested)   FUNCTIONAL TESTS:  Pt is very slow with sit to stand transfers.  Pt unable to stand without using Ue's. 5x STS test:  6 sec with UE's  GAIT: Comments: very slow with rollator.    TREATMENT:                                                                                                                               [facility] Adult PT Treatment:                                                DATE: 12/08/23 Pt seen for aquatic therapy today.  Treatment took place in water 3.5-4.75 ft in depth at the [facility] pool. Temp of water was 91.  Pt entered/exited the pool via stairs with cl supervision and  hand rail.   *walking forward, back and side stepping in 3.6  ft with ue support of barbell  - seated rest period *Ue support on wall: toe raises; heel raises; high knee marching x 10; hip abdct 2 x 5; hip ext 2 x 5 *seated on lift: cycling; hip add/abd; LAQ *standing: hip circles cw&ccw *walking forward/backward 4.0 ft ue support barbell. Pt reports reduced LB discomfort with increased submersion.   Pt requires the buoyancy and  hydrostatic pressure of water for support, and to offload joints by unweighting joint load by at least 50 % in navel deep water and by at least 75-80% in chest to neck deep water.  Viscosity of the water is needed for resistance of strengthening. Water current perturbations provides challenge to standing balance requiring increased core activation.      PATIENT EDUCATION:  Education details: dx, objective findings, rationale of interventions, POC, and relevant anatomy.  PT answered pt's questions.   Person educated: Patient Education method: Explanation Education comprehension: verbalized understanding  HOME EXERCISE PROGRAM: Pt has a HEP from prior PT.     ASSESSMENT:  CLINICAL IMPRESSION: Pain in knees today more limiting than LB.  She reports no LBP to speak of unless standing fr > 30 mins. Left hip injection schedule 12/15 (oa). Progressed LE and core  strengthening. Added hip movement in all planes. No apprehension in water.  Very good attitude and toleration. Goals ongoing      Initial Impression Patient is a 73 y.o. female 6 months s/p L4-5, L5-S1 TLIF.  Pt has a hx of chronic back pain and has received aquatic therapy multiple times with good response.  Pt has much improved LE sx's since having fusion.  Pt received land based PT in [place]  after fusion.  She has exercises from prior PT though hasn't been performing them lately.  Pt is having hip and knee pain with knees being the worst.  Pt has seen orthopedic for her knees and is seeing orthopedic for her hip on Thursday.  Pt reports she can only stand for 30 mins or less due to pain > fatigue.  Pt is ambulating with rollator.  She is limited with ambulation, shopping, and household chores.  Pt uses the motorized cart and sometimes she pushes the cart with grocery shopping.  She has much difficulty lifting LE's to don shoes and socks.  Pt has difficulty with sit/stand transfers and it required her much increased time to perform 5x  STS test.   She has muscle weakness in bilat LE's.  She should benefit from skilled PT to address impairments and improve overall function.     OBJECTIVE IMPAIRMENTS: Abnormal gait, decreased activity tolerance, decreased balance, decreased endurance, decreased mobility, difficulty walking, decreased strength, and pain.   ACTIVITY LIMITATIONS: carrying, lifting, standing, sleeping, stairs, transfers, dressing, and locomotion level  PARTICIPATION LIMITATIONS: cleaning, shopping, and community activity  PERSONAL FACTORS: Time since onset of injury/illness/exacerbation and 3+ comorbidities: Pacemaker, osteopenia/osteoporosis, knee pain, arthritis, DM type 2, peripheral neuropathy are also affecting patient's functional outcome.   REHAB POTENTIAL: Good  CLINICAL DECISION MAKING: Stable/uncomplicated  EVALUATION COMPLEXITY: Low   GOALS:  SHORT TERM GOALS: Target date: 12/19/2023   Pt will tolerate aquatic therapy without adverse effects for improved strength, mobility, pain, function, and tolerance to activity.  Baseline: Goal status: INITIAL  2.  Pt will perform 5x STS test in no > 50 sec for improved functional LE strength and performance of transfers.  Baseline:  Goal status: INITIAL  3.  Pt will report at least a 25% improvement overall in back and bilat knee pain.  Baseline:  Goal status: INITIAL  4.  Pt will report she is able to perform her daily transfers with no > than minimal difficulty.   Baseline:  Goal status: INITIAL Target date:  12/26/2023    5.  Pt will report at least a 50% improvement in dressing.  Baseline:  Goal status: INITIAL    LONG TERM GOALS: Target date: 01/16/2024   Pt will be able to ambulate extended community distance including performing grocery shopping without significant pain and difficulty.   Baseline:  Goal status: INITIAL  2.  Pt will report she is able to perform her daily transfers without difficulty.  Baseline:  Goal status:  INITIAL  3.  Pt will perform 5x STS test in no > 20 sec for improved functional LE strength and performance of transfers.  Baseline:  Goal status: INITIAL  4.  Pt will demo improved bilat hip flexion strength by 5 lbs and hip abd by 10 lbs and knee extension to 5/5 MMT for improved performance of and tolerance with functional mobility.  Baseline:  Goal status: INITIAL  5.  Pt will be independent with pool program and land based HEP for improved pain, strength, and functional mobility.  Baseline:  Goal status: INITIAL   PLAN:  PT FREQUENCY: 2x/week  PT DURATION: 8 weeks  PLANNED INTERVENTIONS: 97164- PT Re-evaluation, 97750- Physical Performance Testing, 97110-Therapeutic exercises, 97530- Therapeutic activity, [id number]- Neuromuscular re-education, 97535- Self Care, 97140- Manual therapy, [phone]- Gait training, [phone]- Aquatic Therapy, Patient/Family education, Stair training, Taping, Scar mobilization, DME instructions, Cryotherapy, and Moist heat.  PLAN FOR NEXT SESSION:  Review current HEP.  Assess tenderness to palpation and soft tissue tightness next visit.  Cont with aquatic therapy.   CARDIAC PACEMAKER--NO E-STIM   Ronal Donnellson) Noor Vidales MPT 12/08/23 3:18 PM [facility] [address] [place], [place], [address] Phone: [phone]   Fax:  [phone]      Referring diagnosis? M54.50 (ICD-10-CM) - Low back pain, unspecified    Treatment diagnosis? (if different than referring diagnosis)  Other low back pain  Pain in both knees, unspecified chronicity  Muscle weakness (generalized)  Difficulty in walking, not elsewhere classified What was this (referring dx) caused by? [x]  Surgery []  Fall [x]  Ongoing issue [x]  Arthritis []  Other: ____________  Laterality: []  Rt []  Lt [x]  Both  Check all possible CPT codes:  *CHOOSE 10 OR LESS*    See Planned Interventions listed in the Plan section of the Evaluation.

## 2023-12-08 NOTE — Progress Notes (Signed)
 Subjective  CC:  Chief Complaint  Patient presents with   Diabetes    HPI: Amy Bartlett is a 73 y.o. female who presents to the office today for follow up of diabetes and problems listed above in the chief complaint.  Discussed the use of AI scribe software for clinical note transcription with the patient, who gave verbal consent to proceed.  History of Present Illness Amy Bartlett Amy Bartlett is a 73 year old female with a history of lumbar surgery who presents with new hip pain.  Hip and groin pain, I reviewed neurosurgery notes from [place]  and follow-up orthopedic notes. - New onset hip pain began after lumbar surgery - Initially thought pain was related to knee arthritis, but surgeon identified pain as originating from the groin/hip region - Evaluated at [facility]; hip replacement not indicated - Scheduled for hip injections for pain management Using naproxen for pain as needed.-   Lower extremity pain and functional impairment - Significant leg pain described as throbbing and a sensation of being 'stuck on the side of the knee' - Pain is most severe in the morning - Uses a cane for ambulation - Uses naproxen and lidocaine cream for pain relief with persistent symptoms - Leg cramps and burning sensation present, not localized to the feet  History of lumbar surgery and neurological symptoms for lumbar stenosis.  Recovered well. - History of lumbar surgery with improvement in back pain - Previous nerve tingling and compression symptoms resolved post-surgery  Hypertension: Blood pressure is well-controlled on current medications.  No chest pain or shortness of breath  Knee and toe arthritis - Arthritis in both knees confirmed by X-ray - Arthritis in toes  Diabetes mellitus - Diabetes well-controlled three months ago with good A1c - Currently on Trulicity , Farxiga and metformin .  Prefers not to check A1c today since fingerstick point-of-care testing  was not available. - Diet stable, including during travel - No symptoms of hyperglycemia such as blurry vision or excessive thirst - Increased urination attributed to high water intake  History of osteoporosis treated with biphosphonate's.  Most recent bone density reviewed.  Lowest T equals -1.4 at left femoral neck.  Stable.  Will take drug holiday and monitor.  Recheck in 2 years.  Physical therapy - Attends physical therapy twice weekly    Wt Readings from Last 3 Encounters:  12/08/23 198 lb 3.2 oz (89.9 kg)  10/25/23 193 lb (87.5 kg)  08/31/23 193 lb 9.6 oz (87.8 kg)    BP Readings from Last 3 Encounters:  12/08/23 116/70  08/31/23 120/79  04/26/23 130/78    Assessment  1. Type 2 diabetes mellitus with stage 3b chronic kidney disease, without long-term current use of insulin (HCC)   2. Need for influenza vaccination   3. Long-term (current) use of injectable non-insulin antidiabetic drugs   4. Hypertension associated with diabetes (HCC)   5. Age-related osteoporosis without fracture   6. Bilateral primary osteoarthritis of hip      Plan  Assessment and Plan Assessment & Plan Type 2 diabetes mellitus with chronic kidney disease Diabetes is well-controlled with previous A1c within target range. Current glucose monitoring is not possible due to a broken machine. No symptoms of hyperglycemia reported. Discussed potential weight loss benefits of Mounjaro over Trulicity , with insurance coverage and cost considerations. - Prescribed Mounjaro and checked insurance coverage, will see if this helps her with weight loss.  This could be beneficial to help treat her osteoarthritic pain. - If Mounjaro  is not covered, will consider semaglutide as an alternative - Scheduled follow-up for diabetes management in three months - Advised to drink plenty of water and maintain diet  Bilateral primary osteoarthritis of hip and osteoarthritis of knees New onset groin pain post-lumbar surgery,  likely related to hip osteoarthritis. Mild condition, no hip replacement needed. Knee arthritis confirmed by previous X-rays. Pain management with naproxen and lidocaine cream. Discussed potential benefits of weight loss on joint pain. - Continue naproxen for pain management - Add Tylenol to naproxen for additional pain relief - Follow up with [facility] for hip injections  Chronic low back pain, status post lumbar surgery Post-surgical recovery from lumbar surgery is progressing well. Previous nerve tingling resolved, indicating successful decompression of the spinal cord. Current leg pain may be related to neuropathy or arthritis. - Continue physical therapy twice a week  Osteopenia, history of osteoporosis.  Improved with treatment.  Abnormality.  Recheck bone density in 2 years.  Continue calcium  vitamin D  and exercise as tolerated.  Patient is a  Hypertension is controlled    Follow up: 3 months to recheck weight and diabetes and cpe Orders Placed This Encounter  Procedures   Flu vaccine HIGH DOSE PF(Fluzone Trivalent)   No orders of the defined types were placed in this encounter.     Immunization History  Administered Date(s) Administered   Fluad Quad(high Dose 65+) 09/08/2018, 11/12/2019, 10/17/2020, 10/30/2021   Fluad Trivalent(High Dose 65+) 10/11/2022   INFLUENZA, HIGH DOSE SEASONAL PF 12/08/2023   PFIZER(Purple Top)SARS-COV-2 Vaccination 02/11/2019, 03/04/2019, 10/15/2019   PNEUMOCOCCAL CONJUGATE-20 12/18/2020   Pfizer Covid-19 Vaccine Bivalent Booster 44yrs & up 11/18/2020   Tdap 04/14/2011   Zoster Recombinant(Shingrix ) 12/18/2020, 02/04/2021   Zoster, Live 04/14/2011    Diabetes Related Lab Review: Lab Results  Component Value Date   HGBA1C 6.3 (A) 08/31/2023   HGBA1C 7.6 (H) 04/26/2023   HGBA1C 7.2 (A) 10/11/2022    Lab Results  Component Value Date   MICROALBUR 0.7 04/26/2023   Lab Results  Component Value Date   CREATININE 1.43 (H) 04/26/2023    BUN 34 (H) 04/26/2023   NA 136 04/26/2023   K 4.0 04/26/2023   CL 100 04/26/2023   CO2 28 04/26/2023   Lab Results  Component Value Date   CHOL 161 04/26/2023   CHOL 126 10/11/2022   CHOL 175 07/07/2022   Lab Results  Component Value Date   HDL 60.20 04/26/2023   HDL 52.40 10/11/2022   HDL 51.60 07/07/2022   Lab Results  Component Value Date   LDLCALC 80 04/26/2023   LDLCALC 51 10/11/2022   LDLCALC 99 07/07/2022   Lab Results  Component Value Date   TRIG 101.0 04/26/2023   TRIG 113.0 10/11/2022   TRIG 123.0 07/07/2022   Lab Results  Component Value Date   CHOLHDL 3 04/26/2023   CHOLHDL 2 10/11/2022   CHOLHDL 3 07/07/2022   Lab Results  Component Value Date   LDLDIRECT 134.0 01/02/2020   The 10-year ASCVD risk score (Arnett DK, et al., 2019) is: 21.1%   Values used to calculate the score:     Age: 87 years     Clincally relevant sex: Female     Is Non-Hispanic African American: Yes     Diabetic: Yes     Tobacco smoker: No     Systolic Blood Pressure: 116 mmHg     Is BP treated: Yes     HDL Cholesterol: 60.2 mg/dL     Total  Cholesterol: 161 mg/dL I have reviewed the PMH, Fam and Soc history. Patient Active Problem List   Diagnosis Date Noted   Peripheral vascular disease with claudication 01/06/2022    Priority: High   Spinal stenosis of lumbar region with neurogenic claudication 01/06/2022    Priority: High   Class 2 severe obesity due to excess calories with serious comorbidity and body mass index (BMI) of 35.0 to 35.9 in adult 03/10/2021    Priority: High   Diabetic peripheral neuropathy associated with type 2 diabetes mellitus (HCC) 12/18/2020    Priority: High   Cardiac pacemaker in situ 03/19/2019    Priority: High    Added automatically from request for surgery [id number]    CKD (chronic kidney disease) stage 3, GFR 30-59 ml/min (HCC) 10/04/2016    Priority: High   NSVT (nonsustained ventricular tachycardia) 12/05/2015    Priority: High    Noted  on PPM.  Echo on 08/2015 EF normal     History of complete heart block 10/04/2012    Priority: High    Overview:  Dr. Autry, S/p pacemaker, stable 2014/cla No ischemia by stress testing 2011    Combined hyperlipidemia associated with type 2 diabetes mellitus (HCC) 11/04/2011    Priority: High   Type 2 diabetes mellitus with stage 3b chronic kidney disease, without long-term current use of insulin (HCC) 11/04/2011    Priority: High    Jardiance , trulicity . Tolerates metformin     Hypertension associated with diabetes (HCC) 01/11/2011    Priority: High   Bilateral primary osteoarthritis of hip 12/08/2023    Priority: Medium     Mild by xrays done in [place] , Neurosurgical notes/ov 11/2023    Gastroesophageal reflux disease 08/31/2023    Priority: Medium    Spondylosis of lumbar region without myelopathy or radiculopathy 07/14/2020    Priority: Medium    Obesity (BMI 30-39.9) 01/12/2016    Priority: Medium    Primary localized osteoarthrosis of left lower leg 07/16/2014    Priority: Medium     Talar dome, toe    Age-related osteoporosis without fracture 06/01/2011    Priority: Medium     Dexa 2025: lowest T = - 1.4 left fem neck, [place], osteopenia. Stable and recheck in 2 years. Off medications. (Didn't tolerate fosamax ) Dexa 2022: T = -1.6 lowest, improved on fosamax  x 3 years. Continue x 2 more years and recheck dexa 2024. Drug holiday if stable at that time. T = -2.6 04/2017 in L hip. New dx: started fosamax  06/2017 T = -1.2 05/2011; recheck at age 25.     Hyponatremia - HCTZ 07/15/2020    Priority: Low   Vitamin D  deficiency 04/16/2011    Priority: Low    Social History: Patient  reports that she has never smoked. She has never used smokeless tobacco. She reports that she does not drink alcohol  and does not use drugs.  Review of Systems: Ophthalmic: negative for eye pain, loss of vision or double vision Cardiovascular: negative for chest pain Respiratory:  negative for SOB or persistent cough Gastrointestinal: negative for abdominal pain Genitourinary: negative for dysuria or gross hematuria MSK: negative for foot lesions Neurologic: negative for weakness or gait disturbance  Objective  Vitals: BP 116/70   Pulse 83   Temp 97.7 F (36.5 C)   Ht 5' 2 (1.575 m)   Wt 198 lb 3.2 oz (89.9 kg)   SpO2 99%   BMI 36.25 kg/m  General: well appearing, no acute distress  Psych:  Alert and  oriented, normal mood and affect HEENT:  Normocephalic, atraumatic, moist mucous membranes, supple neck  Cardiovascular:  Nl S1 and S2, RRR without murmur, gallop or rub. no edema Respiratory:  Good breath sounds bilaterally, CTAB with normal effort, no rales  Diabetic education: ongoing education regarding chronic disease management for diabetes was given today. We continue to reinforce the ABC's of diabetic management: A1c (<7 or 8 dependent upon patient), tight blood pressure control, and cholesterol management with goal LDL < 100 minimally. We discuss diet strategies, exercise recommendations, medication options and possible side effects. At each visit, we review recommended immunizations and preventive care recommendations for diabetics and stress that good diabetic control can prevent other problems. See below for this patient's data. Commons side effects, risks, benefits, and alternatives for medications and treatment plan prescribed today were discussed, and the patient expressed understanding of the given instructions. Patient is instructed to call or message via MyChart if he/she has any questions or concerns regarding our treatment plan. No barriers to understanding were identified. We discussed Red Flag symptoms and signs in detail. Patient expressed understanding regarding what to do in case of urgent or emergency type symptoms.  Medication list was reconciled, printed and provided to the patient in AVS. Patient instructions and summary information was  reviewed with the patient as documented in the AVS. This note was prepared with assistance of Dragon voice recognition software. Occasional wrong-word or sound-a-like substitutions may have occurred due to the inherent limitations of voice recognition software

## 2023-12-13 ENCOUNTER — Encounter (HOSPITAL_BASED_OUTPATIENT_CLINIC_OR_DEPARTMENT_OTHER): Payer: Self-pay | Admitting: Physical Therapy

## 2023-12-13 ENCOUNTER — Ambulatory Visit (HOSPITAL_BASED_OUTPATIENT_CLINIC_OR_DEPARTMENT_OTHER): Admitting: Physical Therapy

## 2023-12-13 DIAGNOSIS — M5459 Other low back pain: Secondary | ICD-10-CM | POA: Diagnosis not present

## 2023-12-13 NOTE — Therapy (Signed)
 OUTPATIENT PHYSICAL THERAPY THORACOLUMBAR TREATMENT    Patient Name: Amy Bartlett MRN: [id number] DOB:06/28/1950, 73 y.o., female Today's Date: 12/13/2023  END OF SESSION:  PT End of Session - 12/13/23 1204     Visit Number 4    PT Start Time 1159           Past Medical History:  Diagnosis Date   Arthritis    Chicken pox    Diabetes mellitus (HCC)    Hyperlipidemia    Hypertension    Hyponatremia - HCTZ 07/15/2020   Osteopenia    Past Surgical History:  Procedure Laterality Date   CARDIAC PACEMAKER PLACEMENT     CESAREAN SECTION  1974, 1977, 1979   REDUCTION MAMMAPLASTY Bilateral 2009   TUBAL LIGATION     Patient Active Problem List   Diagnosis Date Noted   Bilateral primary osteoarthritis of hip 12/08/2023   Gastroesophageal reflux disease 08/31/2023   Peripheral vascular disease with claudication 01/06/2022   Spinal stenosis of lumbar region with neurogenic claudication 01/06/2022   Class 2 severe obesity due to excess calories with serious comorbidity and body mass index (BMI) of 35.0 to 35.9 in adult 03/10/2021   Diabetic peripheral neuropathy associated with type 2 diabetes mellitus (HCC) 12/18/2020   Hyponatremia - HCTZ 07/15/2020   Spondylosis of lumbar region without myelopathy or radiculopathy 07/14/2020   Cardiac pacemaker in situ 03/19/2019   CKD (chronic kidney disease) stage 3, GFR 30-59 ml/min (HCC) 10/04/2016   Obesity (BMI 30-39.9) 01/12/2016   NSVT (nonsustained ventricular tachycardia) 12/05/2015   Primary localized osteoarthrosis of left lower leg 07/16/2014   History of complete heart block 10/04/2012   Combined hyperlipidemia associated with type 2 diabetes mellitus (HCC) 11/04/2011   Type 2 diabetes mellitus with stage 3b chronic kidney disease, without long-term current use of insulin (HCC) 11/04/2011   Age-related osteoporosis without fracture 06/01/2011   Vitamin D  deficiency 04/16/2011   Hypertension associated with diabetes  (HCC) 01/11/2011     REFERRING PROVIDER: Laqueta Ozell BIRCH, MD   REFERRING DIAG: M54.50 (ICD-10-CM) - Low back pain, unspecified   Rationale for Evaluation and Treatment: Rehabilitation  THERAPY DIAG:  No diagnosis found.  ONSET DATE:  chronic pain / Surgery May 2025  SUBJECTIVE:                                                                                                                                                                                           SUBJECTIVE STATEMENT: Pt states she felt good after the aquatic therapy treatments especially last visit.  Pt began taking a supplement for her knees and her sleeping and stated  she thinks it's helping.  Pt denies pain at rest.  Pt is planning on getting an injection for her L hip on 12/15 f/b by her R hip.   Knee pain 3-4/10.  LB OK.  Good response to initial aquatic session with reduction in pain for a few hours.  Had a good vacation.  Was not limited in any activity due to pain.  Used my walker   Initial Subjective Patient had had back pain for years.  Pt states she had degenerative discs.  Pt states she performed aquatic therapy in the 1980's and had significant improvement.  Pt also received aquatic therapy at this location in 2023/2024 which also helped.  Pt underwent L4-5, L5-S1 TLIF in May In [place]  and she thinks it was May 15th.  MD notes indicated pt is feeling improved with regards to her neurogenic claudication and pt verbalized agreement.  Pt received PT in [place]  and she made good improvement.  She recently stopped PT at the end of October because she returned home.  PT order on 8/20 indicated aquatic therapy for low back pain post op.   Pt reports having hip and knee pain with knees being the worst.  Surgeon thought her sx's could be coming from her hips.  Pt has seen orthopedic for her knees and is seeing orthopedic for her hip on Thursday.  MD who saw her for her knees indicated OPPT to work on quadricep   strengthening.  Pt states orthopedic stated she had arthrities.  He ordered PT. Pt has had diagnostic testing for both though PT unable to view them.  MD note on 9/17 indicated preop radicular pain and neurogenic claudication have resolved and her lumbar spine xrays today are satisfactory.    Pt reports she can only stand for 30 mins or less due to pain > fatigue.  Pt is limited with household chores.  Pt has to use support with AD.  Pt is limited with ambulation and shopping.  Pt uses the motorized cart and sometimes she pushes the cart with grocery shopping.  Pt has difficulty dressing.  She has much difficulty lifting LE's to don shoes and socks.  Pt has difficulty with sit/stands including the toilet seat.   Pt has exercises from prior PT though hasn't been performing them lately.   PERTINENT HISTORY:  L4-5, L5-S1 TLIF in May 2025 CARDIAC PACEMAKER--NO E-STIM Bilat knee pain--Pt states she was informed she has arthritis DM type 2, peripheral neuropathy HTN Osteopenia/osteoporosis CKD  PAIN:  NPRS:  0/10 current, 8/10 worst Location:  bilat knees and hip, lower lumbar Pt states she gets cramps in the bed.  Pt denies radicular sx's.   PRECAUTIONS: ICD/Pacemaker and Other: lumbar fusion and osteopenia/osteoporosis.    WEIGHT BEARING RESTRICTIONS: No Wb'ing restrictions.  Pt thinks she has a lifting restriction, but not sure about how much.    FALLS:  Has patient fallen in last 6 months? No  LIVING ENVIRONMENT: Lives with: lives with their spouse Lives in: 1 story home Stairs: 4 steps with 1 rail Has following equipment at home: cane, rollator, shower chair, FWW  OCCUPATION: Retired  PLOF: Independent.  Pt ambulated without an AD.   PATIENT GOALS: strengthen LE's, improve mobility   OBJECTIVE:  Note: Objective measures were completed at Evaluation unless otherwise noted.  DIAGNOSTIC FINDINGS:  Pt is post op.  MD note indicated lumbar spine xrays today are satisfactory.      Pt has received diagnostic testing for hips and knees though  PT unable to view them.  PATIENT SURVEYS:  Modified Oswestry Disability Index:  23 = 46%  COGNITION: Overall cognitive status: Within functional limits for tasks assessed     SENSATION: L2-S2:  2+ to LT bilat   OBSERVATION: Incision has healed well.  Incision closed and dry and has no redness.    PALPATION: Assess next visit   LOWER EXTREMITY MMT:    MMT Right eval Left eval  Hip flexion 15.4 13.3  Hip extension    Hip abduction 11.4 15.0  Hip adduction    Hip internal rotation    Hip external rotation    Knee flexion Tol min resistance 5/5 seated  Knee extension 4-/5 4-/5  Ankle dorsiflexion 5/5 5/5  Ankle plantarflexion weak weak  Ankle inversion    Ankle eversion     (Blank rows = not tested)   FUNCTIONAL TESTS:  Pt is very slow with sit to stand transfers.  Pt unable to stand without using Ue's. 5x STS test:  6 sec with UE's  GAIT: Comments: very slow with rollator.    TREATMENT:                                                                                                                               Reviewed response to prior treatment, pain level, and current HEP.  Pt is performing sidestepping, LAQ with 1# (sometimes without wt), standing heel/toe raises, sit to stands, seated/supine clams.  LAQ 1# 2x10 Seated hip abd with RTB x 8 reps, GTB  x10, x15 reps Sit to stands with chair in front of her with hands on her LE's Supine bridge x 10 reps S/L clams x 10 bilat   [facility] Adult PT Treatment:                                                DATE: 12/08/23 Pt seen for aquatic therapy today.  Treatment took place in water 3.5-4.75 ft in depth at the [facility] pool. Temp of water was 91.  Pt entered/exited the pool via stairs with cl supervision and  hand rail.   *walking forward, back and side stepping in 3.6  ft with ue support of barbell  - seated rest period *Ue  support on wall: toe raises; heel raises; high knee marching x 10; hip abdct 2 x 5; hip ext 2 x 5 *seated on lift: cycling; hip add/abd; LAQ *standing: hip circles cw&ccw *walking forward/backward 4.0 ft ue support barbell. Pt reports reduced LB discomfort with increased submersion.   Pt requires the buoyancy and hydrostatic pressure of water for support, and to offload joints by unweighting joint load by at least 50 % in navel deep water and by at least 75-80% in chest to neck deep water.  Viscosity of the water is needed  for resistance of strengthening. Water current perturbations provides challenge to standing balance requiring increased core activation.      PATIENT EDUCATION:  Education details: dx, objective findings, rationale of interventions, POC, and relevant anatomy.  PT answered pt's questions.   Person educated: Patient Education method: Explanation Education comprehension: verbalized understanding  HOME EXERCISE PROGRAM: Pt has a HEP from prior PT.     ASSESSMENT:  CLINICAL IMPRESSION: Pain in knees today more limiting than LB.  She reports no LBP to speak of unless standing fr > 30 mins. Left hip injection schedule 12/15 (oa). Progressed LE and core strengthening. Added hip movement in all planes. No apprehension in water.  Very good attitude and toleration. Goals ongoing  Slow with mobility Pt has much difficulty with sit to stands.  Pt requires table elevated significantly to perform sit to stands.       Initial Impression Patient is a 73 y.o. female 6 months s/p L4-5, L5-S1 TLIF.  Pt has a hx of chronic back pain and has received aquatic therapy multiple times with good response.  Pt has much improved LE sx's since having fusion.  Pt received land based PT in [place]  after fusion.  She has exercises from prior PT though hasn't been performing them lately.  Pt is having hip and knee pain with knees being the worst.  Pt has seen orthopedic for her knees and is seeing  orthopedic for her hip on Thursday.  Pt reports she can only stand for 30 mins or less due to pain > fatigue.  Pt is ambulating with rollator.  She is limited with ambulation, shopping, and household chores.  Pt uses the motorized cart and sometimes she pushes the cart with grocery shopping.  She has much difficulty lifting LE's to don shoes and socks.  Pt has difficulty with sit/stand transfers and it required her much increased time to perform 5x STS test.   She has muscle weakness in bilat LE's.  She should benefit from skilled PT to address impairments and improve overall function.     OBJECTIVE IMPAIRMENTS: Abnormal gait, decreased activity tolerance, decreased balance, decreased endurance, decreased mobility, difficulty walking, decreased strength, and pain.   ACTIVITY LIMITATIONS: carrying, lifting, standing, sleeping, stairs, transfers, dressing, and locomotion level  PARTICIPATION LIMITATIONS: cleaning, shopping, and community activity  PERSONAL FACTORS: Time since onset of injury/illness/exacerbation and 3+ comorbidities: Pacemaker, osteopenia/osteoporosis, knee pain, arthritis, DM type 2, peripheral neuropathy are also affecting patient's functional outcome.   REHAB POTENTIAL: Good  CLINICAL DECISION MAKING: Stable/uncomplicated  EVALUATION COMPLEXITY: Low   GOALS:  SHORT TERM GOALS: Target date: 12/19/2023   Pt will tolerate aquatic therapy without adverse effects for improved strength, mobility, pain, function, and tolerance to activity.  Baseline: Goal status: INITIAL  2.  Pt will perform 5x STS test in no > 50 sec for improved functional LE strength and performance of transfers.  Baseline:  Goal status: INITIAL  3.  Pt will report at least a 25% improvement overall in back and bilat knee pain.  Baseline:  Goal status: INITIAL  4.  Pt will report she is able to perform her daily transfers with no > than minimal difficulty.   Baseline:  Goal status: INITIAL Target  date:  12/26/2023    5.  Pt will report at least a 50% improvement in dressing.  Baseline:  Goal status: INITIAL    LONG TERM GOALS: Target date: 01/16/2024   Pt will be able to ambulate extended community distance including performing  grocery shopping without significant pain and difficulty.   Baseline:  Goal status: INITIAL  2.  Pt will report she is able to perform her daily transfers without difficulty.  Baseline:  Goal status: INITIAL  3.  Pt will perform 5x STS test in no > 20 sec for improved functional LE strength and performance of transfers.  Baseline:  Goal status: INITIAL  4.  Pt will demo improved bilat hip flexion strength by 5 lbs and hip abd by 10 lbs and knee extension to 5/5 MMT for improved performance of and tolerance with functional mobility.  Baseline:  Goal status: INITIAL  5.  Pt will be independent with pool program and land based HEP for improved pain, strength, and functional mobility.  Baseline:  Goal status: INITIAL   PLAN:  PT FREQUENCY: 2x/week  PT DURATION: 8 weeks  PLANNED INTERVENTIONS: 97164- PT Re-evaluation, 97750- Physical Performance Testing, 97110-Therapeutic exercises, 97530- Therapeutic activity, [id number]- Neuromuscular re-education, 97535- Self Care, [id number]- Manual therapy, [phone]- Gait training, [phone]- Aquatic Therapy, Patient/Family education, Stair training, Taping, Scar mobilization, DME instructions, Cryotherapy, and Moist heat.  PLAN FOR NEXT SESSION:  Review current HEP.  Assess tenderness to palpation and soft tissue tightness next visit.  Cont with aquatic therapy.   CARDIAC PACEMAKER--NO E-STIM   Ronal Trinity) Ziemba MPT 12/13/23 12:07 PM [facility] [address] [place], [place], [address] Phone: [phone]   Fax:  [phone]      Referring diagnosis? M54.50 (ICD-10-CM) - Low back pain, unspecified    Treatment diagnosis? (if different than referring  diagnosis)  Other low back pain  Pain in both knees, unspecified chronicity  Muscle weakness (generalized)  Difficulty in walking, not elsewhere classified What was this (referring dx) caused by? [x]  Surgery []  Fall [x]  Ongoing issue [x]  Arthritis []  Other: ____________  Laterality: []  Rt []  Lt [x]  Both  Check all possible CPT codes:  *CHOOSE 10 OR LESS*    See Planned Interventions listed in the Plan section of the Evaluation.

## 2023-12-14 ENCOUNTER — Ambulatory Visit (HOSPITAL_BASED_OUTPATIENT_CLINIC_OR_DEPARTMENT_OTHER): Payer: Self-pay | Admitting: Physical Therapy

## 2023-12-14 ENCOUNTER — Encounter (HOSPITAL_BASED_OUTPATIENT_CLINIC_OR_DEPARTMENT_OTHER): Payer: Self-pay | Admitting: Physical Therapy

## 2023-12-14 DIAGNOSIS — M5459 Other low back pain: Secondary | ICD-10-CM | POA: Diagnosis not present

## 2023-12-14 DIAGNOSIS — M6281 Muscle weakness (generalized): Secondary | ICD-10-CM

## 2023-12-14 DIAGNOSIS — R262 Difficulty in walking, not elsewhere classified: Secondary | ICD-10-CM

## 2023-12-14 DIAGNOSIS — M25561 Pain in right knee: Secondary | ICD-10-CM

## 2023-12-14 NOTE — Therapy (Signed)
 OUTPATIENT PHYSICAL THERAPY THORACOLUMBAR TREATMENT    Patient Name: Amy Bartlett MRN: [id number] DOB:12/06/1950, 73 y.o., female Today's Date: 12/14/2023  END OF SESSION:  PT End of Session - 12/14/23 1033     Visit Number 5    Number of Visits 16    Date for Recertification  01/16/24    Authorization Type [employer] MCR    PT Start Time 1034   arrives late/able to keep her late   PT Stop Time 1115    PT Time Calculation (min) 41 min    Activity Tolerance Patient tolerated treatment well    Behavior During Therapy [facility] for tasks assessed/performed           Past Medical History:  Diagnosis Date   Arthritis    Chicken pox    Diabetes mellitus (HCC)    Hyperlipidemia    Hypertension    Hyponatremia - HCTZ 07/15/2020   Osteopenia    Past Surgical History:  Procedure Laterality Date   CARDIAC PACEMAKER PLACEMENT     CESAREAN SECTION  1974, 1977, 1979   REDUCTION MAMMAPLASTY Bilateral 2009   TUBAL LIGATION     Patient Active Problem List   Diagnosis Date Noted   Bilateral primary osteoarthritis of hip 12/08/2023   Gastroesophageal reflux disease 08/31/2023   Peripheral vascular disease with claudication 01/06/2022   Spinal stenosis of lumbar region with neurogenic claudication 01/06/2022   Class 2 severe obesity due to excess calories with serious comorbidity and body mass index (BMI) of 35.0 to 35.9 in adult 03/10/2021   Diabetic peripheral neuropathy associated with type 2 diabetes mellitus (HCC) 12/18/2020   Hyponatremia - HCTZ 07/15/2020   Spondylosis of lumbar region without myelopathy or radiculopathy 07/14/2020   Cardiac pacemaker in situ 03/19/2019   CKD (chronic kidney disease) stage 3, GFR 30-59 ml/min (HCC) 10/04/2016   Obesity (BMI 30-39.9) 01/12/2016   NSVT (nonsustained ventricular tachycardia) 12/05/2015   Primary localized osteoarthrosis of left lower leg 07/16/2014   History of complete heart block 10/04/2012   Combined hyperlipidemia  associated with type 2 diabetes mellitus (HCC) 11/04/2011   Type 2 diabetes mellitus with stage 3b chronic kidney disease, without long-term current use of insulin (HCC) 11/04/2011   Age-related osteoporosis without fracture 06/01/2011   Vitamin D  deficiency 04/16/2011   Hypertension associated with diabetes (HCC) 01/11/2011     REFERRING PROVIDER: Laqueta Ozell BIRCH, MD   REFERRING DIAG: M54.50 (ICD-10-CM) - Low back pain, unspecified   Rationale for Evaluation and Treatment: Rehabilitation  THERAPY DIAG:  Pain in both knees, unspecified chronicity  Other low back pain  Muscle weakness (generalized)  Difficulty in walking, not elsewhere classified  ONSET DATE:  chronic pain / Surgery May 2025  SUBJECTIVE:  SUBJECTIVE STATEMENT: Pt states some achiness 1-2/10 today.  Says legs feel heavy after having land based therapy yesterday     Initial Subjective Patient had had back pain for years.  Pt states she had degenerative discs.  Pt states she performed aquatic therapy in the 1980's and had significant improvement.  Pt also received aquatic therapy at this location in 2023/2024 which also helped.  Pt underwent L4-5, L5-S1 TLIF in May In [place]  and she thinks it was May 15th.  MD notes indicated pt is feeling improved with regards to her neurogenic claudication and pt verbalized agreement.  Pt received PT in [place]  and she made good improvement.  She recently stopped PT at the end of October because she returned home.  PT order on 8/20 indicated aquatic therapy for low back pain post op.   Pt reports having hip and knee pain with knees being the worst.  Surgeon thought her sx's could be coming from her hips.  Pt has seen orthopedic for her knees and is seeing orthopedic for her hip on Thursday.  MD  who saw her for her knees indicated OPPT to work on quadricep  strengthening.  Pt states orthopedic stated she had arthrities.  He ordered PT. Pt has had diagnostic testing for both though PT unable to view them.  MD note on 9/17 indicated preop radicular pain and neurogenic claudication have resolved and her lumbar spine xrays today are satisfactory.    Pt reports she can only stand for 30 mins or less due to pain > fatigue.  Pt is limited with household chores.  Pt has to use support with AD.  Pt is limited with ambulation and shopping.  Pt uses the motorized cart and sometimes she pushes the cart with grocery shopping.  Pt has difficulty dressing.  She has much difficulty lifting LE's to don shoes and socks.  Pt has difficulty with sit/stands including the toilet seat.   Pt has exercises from prior PT though hasn't been performing them lately.   PERTINENT HISTORY:  L4-5, L5-S1 TLIF in May 2025 CARDIAC PACEMAKER--NO E-STIM Bilat knee pain--Pt states she was informed she has arthritis DM type 2, peripheral neuropathy HTN Osteopenia/osteoporosis CKD  PAIN:  NPRS:  0/10 current, 8/10 worst Location:  bilat knees and hip, lower lumbar Pt states she gets cramps in the bed.  Pt denies radicular sx's.   PRECAUTIONS: ICD/Pacemaker and Other: lumbar fusion and osteopenia/osteoporosis.    WEIGHT BEARING RESTRICTIONS: No Wb'ing restrictions.  Pt thinks she has a lifting restriction, but not sure about how much.    FALLS:  Has patient fallen in last 6 months? No  LIVING ENVIRONMENT: Lives with: lives with their spouse Lives in: 1 story home Stairs: 4 steps with 1 rail Has following equipment at home: cane, rollator, shower chair, FWW  OCCUPATION: Retired  PLOF: Independent.  Pt ambulated without an AD.   PATIENT GOALS: strengthen LE's, improve mobility   OBJECTIVE:  Note: Objective measures were completed at Evaluation unless otherwise noted.  DIAGNOSTIC FINDINGS:  Pt is post op.   MD note indicated lumbar spine xrays today are satisfactory.     Pt has received diagnostic testing for hips and knees though PT unable to view them.  PATIENT SURVEYS:  Modified Oswestry Disability Index:  23 = 46%  COGNITION: Overall cognitive status: Within functional limits for tasks assessed     SENSATION: L2-S2:  2+ to LT bilat   OBSERVATION: Incision has healed well.  Incision closed and  dry and has no redness.    PALPATION: Assess next visit   LOWER EXTREMITY MMT:    MMT Right eval Left eval  Hip flexion 15.4 13.3  Hip extension    Hip abduction 11.4 15.0  Hip adduction    Hip internal rotation    Hip external rotation    Knee flexion Tol min resistance 5/5 seated  Knee extension 4-/5 4-/5  Ankle dorsiflexion 5/5 5/5  Ankle plantarflexion weak weak  Ankle inversion    Ankle eversion     (Blank rows = not tested)   FUNCTIONAL TESTS:  Pt is very slow with sit to stand transfers.  Pt unable to stand without using Ue's. 5x STS test:  6 sec with UE's  GAIT: Comments: very slow with rollator.    TREATMENT:                                                                                                                                [facility] Adult PT Treatment:                                                DATE: 12/14/23 Pt seen for aquatic therapy today.  Treatment took place in water 3.5-4.75 ft in depth at the [facility] pool. Temp of water was 91.  Pt entered/exited the pool via stairs with cl supervision and  hand rail.   *walking forward, back and side stepping in 3.6  ft with ue support of barbell  - squatted rest period *Ue support on wall: toe raises; heel raises; high knee marching  hip abdct ; hip ext x10  -squatted rest period *step ups leading R/L x 6 ue support bilaterally then unilaterally as tolerated.  -seated rest period on 3rd step *cycling; hip add/abd; flutter kicking on 3rd water step  -seated rest period *forward and  backward marching ue support barbell port barbell. Pt reports reduced LB discomfort with increased submersion.   Pt requires the buoyancy and hydrostatic pressure of water for support, and to offload joints by unweighting joint load by at least 50 % in navel deep water and by at least 75-80% in chest to neck deep water.  Viscosity of the water is needed for resistance of strengthening. Water current perturbations provides challenge to standing balance requiring increased core activation.  Reviewed response to prior treatment, pain level, and current HEP.  Pt is performing sidestepping, LAQ with 1# (sometimes without wt), standing heel/toe raises, sit to stands, seated/supine clams.  LAQ 1# 2x10 Seated hip abd with RTB x 8 reps, GTB  x10, x15 reps Sit to stands with chair in front of her with hands on her LE's Supine bridge x 10 reps S/L clams x 10 bilat   [facility] Adult PT Treatment:  DATE: 12/08/23 Pt seen for aquatic therapy today.  Treatment took place in water 3.5-4.75 ft in depth at the [facility] pool. Temp of water was 91.  Pt entered/exited the pool via stairs with cl supervision and  hand rail.   *walking forward, back and side stepping in 3.6  ft with ue support of barbell  - seated rest period *Ue support on wall: toe raises; heel raises; high knee marching x 10; hip abdct 2 x 5; hip ext 2 x 5 *seated on lift: cycling; hip add/abd; LAQ *standing: hip circles cw&ccw *walking forward/backward 4.0 ft ue support barbell. Pt reports reduced LB discomfort with increased submersion.   Pt requires the buoyancy and hydrostatic pressure of water for support, and to offload joints by unweighting joint load by at least 50 % in navel deep water and by at least 75-80% in chest to neck deep water.  Viscosity of the water is needed for resistance of strengthening. Water current perturbations provides challenge to standing balance requiring  increased core activation.      PATIENT EDUCATION:  Education details: dx, objective findings, rationale of interventions, POC, and relevant anatomy.  PT answered pt's questions.   Person educated: Patient Education method: Explanation Education comprehension: verbalized understanding  HOME EXERCISE PROGRAM: Pt has a HEP from prior PT.     ASSESSMENT:  CLINICAL IMPRESSION: Pt arrives late although able to extend time on end.  She reports land therapy yesterday with some residual le heaviness.  I suggest trying to avoid back to back sessions going forward as able.  She VU.  Was able to progress continuous repetitions of exercises today although she continues to require multiple rest periods throughout.  She maintains good attitude.  Reports reduction in LB discomfort and heaviness of le at end of session.  Goals ongoing        Initial Impression Patient is a 73 y.o. female 6 months s/p L4-5, L5-S1 TLIF.  Pt has a hx of chronic back pain and has received aquatic therapy multiple times with good response.  Pt has much improved LE sx's since having fusion.  Pt received land based PT in [place]  after fusion.  She has exercises from prior PT though hasn't been performing them lately.  Pt is having hip and knee pain with knees being the worst.  Pt has seen orthopedic for her knees and is seeing orthopedic for her hip on Thursday.  Pt reports she can only stand for 30 mins or less due to pain > fatigue.  Pt is ambulating with rollator.  She is limited with ambulation, shopping, and household chores.  Pt uses the motorized cart and sometimes she pushes the cart with grocery shopping.  She has much difficulty lifting LE's to don shoes and socks.  Pt has difficulty with sit/stand transfers and it required her much increased time to perform 5x STS test.   She has muscle weakness in bilat LE's.  She should benefit from skilled PT to address impairments and improve overall function.     OBJECTIVE  IMPAIRMENTS: Abnormal gait, decreased activity tolerance, decreased balance, decreased endurance, decreased mobility, difficulty walking, decreased strength, and pain.   ACTIVITY LIMITATIONS: carrying, lifting, standing, sleeping, stairs, transfers, dressing, and locomotion level  PARTICIPATION LIMITATIONS: cleaning, shopping, and community activity  PERSONAL FACTORS: Time since onset of injury/illness/exacerbation and 3+ comorbidities: Pacemaker, osteopenia/osteoporosis, knee pain, arthritis, DM type 2, peripheral neuropathy are also affecting patient's functional outcome.   REHAB POTENTIAL: Good  CLINICAL DECISION MAKING:  Stable/uncomplicated  EVALUATION COMPLEXITY: Low   GOALS:  SHORT TERM GOALS: Target date: 12/19/2023   Pt will tolerate aquatic therapy without adverse effects for improved strength, mobility, pain, function, and tolerance to activity.  Baseline: Goal status: INITIAL  2.  Pt will perform 5x STS test in no > 50 sec for improved functional LE strength and performance of transfers.  Baseline:  Goal status: INITIAL  3.  Pt will report at least a 25% improvement overall in back and bilat knee pain.  Baseline:  Goal status: INITIAL  4.  Pt will report she is able to perform her daily transfers with no > than minimal difficulty.   Baseline:  Goal status: INITIAL Target date:  12/26/2023    5.  Pt will report at least a 50% improvement in dressing.  Baseline:  Goal status: INITIAL    LONG TERM GOALS: Target date: 01/16/2024   Pt will be able to ambulate extended community distance including performing grocery shopping without significant pain and difficulty.   Baseline:  Goal status: INITIAL  2.  Pt will report she is able to perform her daily transfers without difficulty.  Baseline:  Goal status: INITIAL  3.  Pt will perform 5x STS test in no > 20 sec for improved functional LE strength and performance of transfers.  Baseline:  Goal status:  INITIAL  4.  Pt will demo improved bilat hip flexion strength by 5 lbs and hip abd by 10 lbs and knee extension to 5/5 MMT for improved performance of and tolerance with functional mobility.  Baseline:  Goal status: INITIAL  5.  Pt will be independent with pool program and land based HEP for improved pain, strength, and functional mobility.  Baseline:  Goal status: INITIAL   PLAN:  PT FREQUENCY: 2x/week  PT DURATION: 8 weeks  PLANNED INTERVENTIONS: 97164- PT Re-evaluation, 97750- Physical Performance Testing, 97110-Therapeutic exercises, 97530- Therapeutic activity, [id number]- Neuromuscular re-education, 97535- Self Care, [id number]- Manual therapy, [phone]- Gait training, [phone]- Aquatic Therapy, Patient/Family education, Stair training, Taping, Scar mobilization, DME instructions, Cryotherapy, and Moist heat.  PLAN FOR NEXT SESSION:  Review current HEP.  Assess tenderness to palpation and soft tissue tightness next visit.  Cont with aquatic therapy.   CARDIAC PACEMAKER--NO E-STIM   Ronal Rowena) Ramiyah Mcclenahan MPT 12/14/23 11:15 AM [facility] [address] [place], [place], [address] Phone: [phone]   Fax:  [phone]      Referring diagnosis? M54.50 (ICD-10-CM) - Low back pain, unspecified    Treatment diagnosis? (if different than referring diagnosis)  Other low back pain  Pain in both knees, unspecified chronicity  Muscle weakness (generalized)  Difficulty in walking, not elsewhere classified What was this (referring dx) caused by? [x]  Surgery []  Fall [x]  Ongoing issue [x]  Arthritis []  Other: ____________  Laterality: []  Rt []  Lt [x]  Both  Check all possible CPT codes:  *CHOOSE 10 OR LESS*    See Planned Interventions listed in the Plan section of the Evaluation.

## 2023-12-20 ENCOUNTER — Ambulatory Visit (HOSPITAL_BASED_OUTPATIENT_CLINIC_OR_DEPARTMENT_OTHER)

## 2023-12-20 DIAGNOSIS — M5459 Other low back pain: Secondary | ICD-10-CM

## 2023-12-20 DIAGNOSIS — R262 Difficulty in walking, not elsewhere classified: Secondary | ICD-10-CM

## 2023-12-20 DIAGNOSIS — M6281 Muscle weakness (generalized): Secondary | ICD-10-CM

## 2023-12-20 DIAGNOSIS — M25562 Pain in left knee: Secondary | ICD-10-CM

## 2023-12-20 NOTE — Therapy (Signed)
 OUTPATIENT PHYSICAL THERAPY THORACOLUMBAR TREATMENT    Patient Name: Amy Bartlett MRN: [id number] DOB:Nov 22, 1950, 73 y.o., female Today's Date: 12/20/2023  END OF SESSION:  PT End of Session - 12/20/23 1342     Visit Number 5            Past Medical History:  Diagnosis Date   Arthritis    Chicken pox    Diabetes mellitus (HCC)    Hyperlipidemia    Hypertension    Hyponatremia - HCTZ 07/15/2020   Osteopenia    Past Surgical History:  Procedure Laterality Date   CARDIAC PACEMAKER PLACEMENT     CESAREAN SECTION  1974, 1977, 1979   REDUCTION MAMMAPLASTY Bilateral 2009   TUBAL LIGATION     Patient Active Problem List   Diagnosis Date Noted   Bilateral primary osteoarthritis of hip 12/08/2023   Gastroesophageal reflux disease 08/31/2023   Peripheral vascular disease with claudication 01/06/2022   Spinal stenosis of lumbar region with neurogenic claudication 01/06/2022   Class 2 severe obesity due to excess calories with serious comorbidity and body mass index (BMI) of 35.0 to 35.9 in adult 03/10/2021   Diabetic peripheral neuropathy associated with type 2 diabetes mellitus (HCC) 12/18/2020   Hyponatremia - HCTZ 07/15/2020   Spondylosis of lumbar region without myelopathy or radiculopathy 07/14/2020   Cardiac pacemaker in situ 03/19/2019   CKD (chronic kidney disease) stage 3, GFR 30-59 ml/min (HCC) 10/04/2016   Obesity (BMI 30-39.9) 01/12/2016   NSVT (nonsustained ventricular tachycardia) 12/05/2015   Primary localized osteoarthrosis of left lower leg 07/16/2014   History of complete heart block 10/04/2012   Combined hyperlipidemia associated with type 2 diabetes mellitus (HCC) 11/04/2011   Type 2 diabetes mellitus with stage 3b chronic kidney disease, without long-term current use of insulin (HCC) 11/04/2011   Age-related osteoporosis without fracture 06/01/2011   Vitamin D  deficiency 04/16/2011   Hypertension associated with diabetes (HCC) 01/11/2011      REFERRING PROVIDER: Laqueta Ozell BIRCH, MD   REFERRING DIAG: M54.50 (ICD-10-CM) - Low back pain, unspecified   Rationale for Evaluation and Treatment: Rehabilitation  THERAPY DIAG:  Other low back pain  Pain in both knees, unspecified chronicity  Muscle weakness (generalized)  Difficulty in walking, not elsewhere classified  ONSET DATE:  chronic pain / Surgery May 2025  SUBJECTIVE:                                                                                                                                                                                           SUBJECTIVE STATEMENT: Pt had an US  guided injection in L hip yesterday.  She states her  L hip is feeling better and thinks her walking is better.   PERTINENT HISTORY:  L4-5, L5-S1 TLIF in May 2025 CARDIAC PACEMAKER--NO E-STIM Bilat knee pain--Pt states she was informed she has arthritis DM type 2, peripheral neuropathy HTN Osteopenia/osteoporosis CKD  PAIN:    PRECAUTIONS: ICD/Pacemaker and Other: lumbar fusion and osteopenia/osteoporosis.    WEIGHT BEARING RESTRICTIONS: No Wb'ing restrictions.  Pt thinks she has a lifting restriction, but not sure about how much.    FALLS:  Has patient fallen in last 6 months? No  LIVING ENVIRONMENT: Lives with: lives with their spouse Lives in: 1 story home Stairs: 4 steps with 1 rail Has following equipment at home: cane, rollator, shower chair, FWW  OCCUPATION: Retired  PLOF: Independent.  Pt ambulated without an AD.   PATIENT GOALS: strengthen LE's, improve mobility   OBJECTIVE:  Note: Objective measures were completed at Evaluation unless otherwise noted.  DIAGNOSTIC FINDINGS:  Pt is post op.  MD note indicated lumbar spine xrays today are satisfactory.     Pt has received diagnostic testing for hips and knees though PT unable to view them.  PT withheld treatment today.   ASSESSMENT:  CLINICAL IMPRESSION: Pt received an US  guided injection in  L hip yesterday, which she states was < than 24 hours ago.  PT withheld treatment today.  Pt will return to PT on Friday for an aquatic therapy visit.        OBJECTIVE IMPAIRMENTS: Abnormal gait, decreased activity tolerance, decreased balance, decreased endurance, decreased mobility, difficulty walking, decreased strength, and pain.   ACTIVITY LIMITATIONS: carrying, lifting, standing, sleeping, stairs, transfers, dressing, and locomotion level  PARTICIPATION LIMITATIONS: cleaning, shopping, and community activity  PERSONAL FACTORS: Time since onset of injury/illness/exacerbation and 3+ comorbidities: Pacemaker, osteopenia/osteoporosis, knee pain, arthritis, DM type 2, peripheral neuropathy are also affecting patient's functional outcome.   REHAB POTENTIAL: Good  CLINICAL DECISION MAKING: Stable/uncomplicated  EVALUATION COMPLEXITY: Low   GOALS:  SHORT TERM GOALS: Target date: 12/19/2023   Pt will tolerate aquatic therapy without adverse effects for improved strength, mobility, pain, function, and tolerance to activity.  Baseline: Goal status: INITIAL  2.  Pt will perform 5x STS test in no > 50 sec for improved functional LE strength and performance of transfers.  Baseline:  Goal status: INITIAL  3.  Pt will report at least a 25% improvement overall in back and bilat knee pain.  Baseline:  Goal status: INITIAL  4.  Pt will report she is able to perform her daily transfers with no > than minimal difficulty.   Baseline:  Goal status: INITIAL Target date:  12/26/2023    5.  Pt will report at least a 50% improvement in dressing.  Baseline:  Goal status: INITIAL    LONG TERM GOALS: Target date: 01/16/2024   Pt will be able to ambulate extended community distance including performing grocery shopping without significant pain and difficulty.   Baseline:  Goal status: INITIAL  2.  Pt will report she is able to perform her daily transfers without difficulty.  Baseline:   Goal status: INITIAL  3.  Pt will perform 5x STS test in no > 20 sec for improved functional LE strength and performance of transfers.  Baseline:  Goal status: INITIAL  4.  Pt will demo improved bilat hip flexion strength by 5 lbs and hip abd by 10 lbs and knee extension to 5/5 MMT for improved performance of and tolerance with functional mobility.  Baseline:  Goal status: INITIAL  5.  Pt will be independent with pool program and land based HEP for improved pain, strength, and functional mobility.  Baseline:  Goal status: INITIAL   PLAN:  PT FREQUENCY: 2x/week  PT DURATION: 8 weeks  PLANNED INTERVENTIONS: 97164- PT Re-evaluation, 97750- Physical Performance Testing, 97110-Therapeutic exercises, 97530- Therapeutic activity, [id number]- Neuromuscular re-education, 97535- Self Care, 02859- Manual therapy, [phone]- Gait training, [phone]- Aquatic Therapy, Patient/Family education, Stair training, Taping, Scar mobilization, DME instructions, Cryotherapy, and Moist heat.  PLAN FOR NEXT SESSION:  Review current HEP.  Assess tenderness to palpation and soft tissue tightness next visit.  Cont with aquatic therapy.   CARDIAC PACEMAKER--NO E-STIM   Leigh Minerva III PT, DPT 12/20/2023 1:47 PM

## 2023-12-23 ENCOUNTER — Other Ambulatory Visit: Payer: Self-pay | Admitting: Family Medicine

## 2023-12-23 ENCOUNTER — Ambulatory Visit (HOSPITAL_BASED_OUTPATIENT_CLINIC_OR_DEPARTMENT_OTHER): Admitting: Physical Therapy

## 2023-12-23 DIAGNOSIS — E785 Hyperlipidemia, unspecified: Secondary | ICD-10-CM

## 2024-01-02 ENCOUNTER — Ambulatory Visit (HOSPITAL_BASED_OUTPATIENT_CLINIC_OR_DEPARTMENT_OTHER): Admitting: Physical Therapy

## 2024-01-04 ENCOUNTER — Ambulatory Visit (HOSPITAL_BASED_OUTPATIENT_CLINIC_OR_DEPARTMENT_OTHER): Admitting: Physical Therapy

## 2024-01-04 ENCOUNTER — Encounter (HOSPITAL_BASED_OUTPATIENT_CLINIC_OR_DEPARTMENT_OTHER): Payer: Self-pay | Admitting: Physical Therapy

## 2024-01-04 DIAGNOSIS — R262 Difficulty in walking, not elsewhere classified: Secondary | ICD-10-CM

## 2024-01-04 DIAGNOSIS — M25561 Pain in right knee: Secondary | ICD-10-CM

## 2024-01-04 DIAGNOSIS — M5459 Other low back pain: Secondary | ICD-10-CM | POA: Diagnosis not present

## 2024-01-04 DIAGNOSIS — M6281 Muscle weakness (generalized): Secondary | ICD-10-CM

## 2024-01-04 NOTE — Therapy (Signed)
 " OUTPATIENT PHYSICAL THERAPY THORACOLUMBAR TREATMENT    Patient Name: Amy Bartlett MRN: [id number] DOB:1950/06/07, 73 y.o., female Today's Date: 01/04/2024  END OF SESSION:  PT End of Session - 01/04/24 1335     Visit Number 6    Number of Visits 16    Date for Recertification  01/16/24    Authorization Type [employer] MCR    PT Start Time 1332   Pt arrived 14 mins late to treatment.   PT Stop Time 1402    PT Time Calculation (min) 30 min    Activity Tolerance Patient tolerated treatment well    Behavior During Therapy WFL for tasks assessed/performed           Past Medical History:  Diagnosis Date   Arthritis    Chicken pox    Diabetes mellitus (HCC)    Hyperlipidemia    Hypertension    Hyponatremia - HCTZ 07/15/2020   Osteopenia    Past Surgical History:  Procedure Laterality Date   CARDIAC PACEMAKER PLACEMENT     CESAREAN SECTION  1974, 1977, 1979   REDUCTION MAMMAPLASTY Bilateral 2009   TUBAL LIGATION     Patient Active Problem List   Diagnosis Date Noted   Bilateral primary osteoarthritis of hip 12/08/2023   Gastroesophageal reflux disease 08/31/2023   Peripheral vascular disease with claudication 01/06/2022   Spinal stenosis of lumbar region with neurogenic claudication 01/06/2022   Class 2 severe obesity due to excess calories with serious comorbidity and body mass index (BMI) of 35.0 to 35.9 in adult 03/10/2021   Diabetic peripheral neuropathy associated with type 2 diabetes mellitus (HCC) 12/18/2020   Hyponatremia - HCTZ 07/15/2020   Spondylosis of lumbar region without myelopathy or radiculopathy 07/14/2020   Cardiac pacemaker in situ 03/19/2019   CKD (chronic kidney disease) stage 3, GFR 30-59 ml/min (HCC) 10/04/2016   Obesity (BMI 30-39.9) 01/12/2016   NSVT (nonsustained ventricular tachycardia) 12/05/2015   Primary localized osteoarthrosis of left lower leg 07/16/2014   History of complete heart block 10/04/2012   Combined hyperlipidemia  associated with type 2 diabetes mellitus (HCC) 11/04/2011   Type 2 diabetes mellitus with stage 3b chronic kidney disease, without long-term current use of insulin (HCC) 11/04/2011   Age-related osteoporosis without fracture 06/01/2011   Vitamin D  deficiency 04/16/2011   Hypertension associated with diabetes (HCC) 01/11/2011     REFERRING PROVIDER: Laqueta Ozell BIRCH, MD   REFERRING DIAG: M54.50 (ICD-10-CM) - Low back pain, unspecified   Rationale for Evaluation and Treatment: Rehabilitation  THERAPY DIAG:  Other low back pain  Pain in both knees, unspecified chronicity  Muscle weakness (generalized)  Difficulty in walking, not elsewhere classified  ONSET DATE:  chronic pain / Surgery May 2025  SUBJECTIVE:  SUBJECTIVE STATEMENT: Pt rode in the car to [place]  for the holidays and had significant pain when riding in the car.  Pt states she has been on her feet most of the day organizing and cleaning her home.  She has been pushing furniture.  Pt had a hip injection on 12/15 and states it improved her sx's.  She states it helped a lot.  She can lift her leg better.  Pt hasn't been performing her HEP.  Pt is taking Naproxen and tylenol.      Initial Subjective Patient had had back pain for years.  Pt states she had degenerative discs.  Pt states she performed aquatic therapy in the 1980's and had significant improvement.  Pt also received aquatic therapy at this location in 2023/2024 which also helped.  Pt underwent L4-5, L5-S1 TLIF in May In [place]  and she thinks it was May 15th.  MD notes indicated pt is feeling improved with regards to her neurogenic claudication and pt verbalized agreement.  Pt received PT in [place]  and she made good improvement.  She recently stopped PT at the end of October  because she returned home.  PT order on 8/20 indicated aquatic therapy for low back pain post op.   Pt reports having hip and knee pain with knees being the worst.  Surgeon thought her sx's could be coming from her hips.  Pt has seen orthopedic for her knees and is seeing orthopedic for her hip on Thursday.  MD who saw her for her knees indicated OPPT to work on quadricep  strengthening.  Pt states orthopedic stated she had arthrities.  He ordered PT. Pt has had diagnostic testing for both though PT unable to view them.  MD note on 9/17 indicated preop radicular pain and neurogenic claudication have resolved and her lumbar spine xrays today are satisfactory.    Pt reports she can only stand for 30 mins or less due to pain > fatigue.  Pt is limited with household chores.  Pt has to use support with AD.  Pt is limited with ambulation and shopping.  Pt uses the motorized cart and sometimes she pushes the cart with grocery shopping.  Pt has difficulty dressing.  She has much difficulty lifting LE's to don shoes and socks.  Pt has difficulty with sit/stands including the toilet seat.   Pt has exercises from prior PT though hasn't been performing them lately.   PERTINENT HISTORY:  L4-5, L5-S1 TLIF in May 2025 CARDIAC PACEMAKER--NO E-STIM Bilat knee pain--Pt states she was informed she has arthritis DM type 2, peripheral neuropathy HTN Osteopenia/osteoporosis CKD  PAIN:  NPRS:  2-3/10 /  0/10 / 1-2/10 Location:  thoracic / lumbar and R knee /  L knee R knee feels great Pt states she gets cramps in the bed.  Pt denies radicular sx's.   PRECAUTIONS: ICD/Pacemaker and Other: lumbar fusion and osteopenia/osteoporosis.    WEIGHT BEARING RESTRICTIONS: No Wb'ing restrictions.  Pt thinks she has a lifting restriction, but not sure about how much.    FALLS:  Has patient fallen in last 6 months? No  LIVING ENVIRONMENT: Lives with: lives with their spouse Lives in: 1 story home Stairs: 4 steps  with 1 rail Has following equipment at home: cane, rollator, shower chair, FWW  OCCUPATION: Retired  PLOF: Independent.  Pt ambulated without an AD.   PATIENT GOALS: strengthen LE's, improve mobility   OBJECTIVE:  Note: Objective measures were completed at Evaluation unless otherwise noted.  DIAGNOSTIC FINDINGS:  Pt is post op.  MD note indicated lumbar spine xrays today are satisfactory.     Pt has received diagnostic testing for hips and knees though PT unable to view them.  PATIENT SURVEYS:  Modified Oswestry Disability Index:  23 = 46%  COGNITION: Overall cognitive status: Within functional limits for tasks assessed     SENSATION: L2-S2:  2+ to LT bilat   OBSERVATION: Incision has healed well.  Incision closed and dry and has no redness.    PALPATION: Assess next visit   LOWER EXTREMITY MMT:    MMT Right eval Left eval  Hip flexion 15.4 13.3  Hip extension    Hip abduction 11.4 15.0  Hip adduction    Hip internal rotation    Hip external rotation    Knee flexion Tol min resistance 5/5 seated  Knee extension 4-/5 4-/5  Ankle dorsiflexion 5/5 5/5  Ankle plantarflexion weak weak  Ankle inversion    Ankle eversion     (Blank rows = not tested)   FUNCTIONAL TESTS:  Pt is very slow with sit to stand transfers.  Pt unable to stand without using Ue's. 5x STS test:  6 sec with UE's  GAIT: Comments: very slow with rollator.    TREATMENT:                                                                                                                                12/31  Reviewed pt presentation, pain levels, and HEP compliance.  Nustep lvl 3 bilat UE/LE's  x 5 mins LAQ  2# x15 on R, approx 8 on L Seated Hip abduction with GTB approx 15 Sit to stands from table minimally elevated x5,3 reps without UE support Marching with UE support on back of chair x10 each   [facility] Adult PT Treatment:                                                DATE:  12/14/23 Pt seen for aquatic therapy today.  Treatment took place in water 3.5-4.75 ft in depth at the [facility] pool. Temp of water was 91.  Pt entered/exited the pool via stairs with cl supervision and  hand rail.   *walking forward, back and side stepping in 3.6  ft with ue support of barbell  - squatted rest period *Ue support on wall: toe raises; heel raises; high knee marching  hip abdct ; hip ext x10  -squatted rest period *step ups leading R/L x 6 ue support bilaterally then unilaterally as tolerated.  -seated rest period on 3rd step *cycling; hip add/abd; flutter kicking on 3rd water step  -seated rest period *forward and backward marching ue support barbell port barbell. Pt reports reduced LB discomfort with increased submersion.   Pt requires the buoyancy and  hydrostatic pressure of water for support, and to offload joints by unweighting joint load by at least 50 % in navel deep water and by at least 75-80% in chest to neck deep water.  Viscosity of the water is needed for resistance of strengthening. Water current perturbations provides challenge to standing balance requiring increased core activation.  Reviewed response to prior treatment, pain level, and current HEP.  Pt is performing sidestepping, LAQ with 1# (sometimes without wt), standing heel/toe raises, sit to stands, seated/supine clams.  LAQ 1# 2x10 Seated hip abd with RTB x 8 reps, GTB  x10, x15 reps Sit to stands with chair in front of her with hands on her LE's Supine bridge x 10 reps S/L clams x 10 bilat   [facility] Adult PT Treatment:                                                DATE: 12/08/23 Pt seen for aquatic therapy today.  Treatment took place in water 3.5-4.75 ft in depth at the [facility] pool. Temp of water was 91.  Pt entered/exited the pool via stairs with cl supervision and  hand rail.   *walking forward, back and side stepping in 3.6  ft with ue support of barbell  - seated  rest period *Ue support on wall: toe raises; heel raises; high knee marching x 10; hip abdct 2 x 5; hip ext 2 x 5 *seated on lift: cycling; hip add/abd; LAQ *standing: hip circles cw&ccw *walking forward/backward 4.0 ft ue support barbell. Pt reports reduced LB discomfort with increased submersion.   Pt requires the buoyancy and hydrostatic pressure of water for support, and to offload joints by unweighting joint load by at least 50 % in navel deep water and by at least 75-80% in chest to neck deep water.  Viscosity of the water is needed for resistance of strengthening. Water current perturbations provides challenge to standing balance requiring increased core activation.      PATIENT EDUCATION:  Education details: dx, exercise form, rationale of interventions, POC, and relevant anatomy.  PT answered pt's questions.   Person educated: Patient Education method: Explanation Education comprehension: verbalized understanding  HOME EXERCISE PROGRAM: Pt has a HEP from prior PT.     ASSESSMENT:  CLINICAL IMPRESSION: Treatment time limited today due to pt arriving late.  Pt had a hip injection earlier this month and reports improved sx's.  Pt hasn't been performing her home exercises and PT encouraged pt to perform her HEP.  PT instructed pt in correct form with exercises and she performed exercises well.  Pt gave good effort with all exercises.  She began having R knee pain with 2nd set of sit to stands and PT had pt stop.  Pt responded well to treatment reporting no increased pain after treatment.  She should benefit from cont skilled PT with a combination of land based and aquatic therapy to address ongoing impairments and goals and to improve overall function.      OBJECTIVE IMPAIRMENTS: Abnormal gait, decreased activity tolerance, decreased balance, decreased endurance, decreased mobility, difficulty walking, decreased strength, and pain.   ACTIVITY LIMITATIONS: carrying, lifting, standing,  sleeping, stairs, transfers, dressing, and locomotion level  PARTICIPATION LIMITATIONS: cleaning, shopping, and community activity  PERSONAL FACTORS: Time since onset of injury/illness/exacerbation and 3+ comorbidities: Pacemaker, osteopenia/osteoporosis, knee pain, arthritis, DM type 2,  peripheral neuropathy are also affecting patient's functional outcome.   REHAB POTENTIAL: Good  CLINICAL DECISION MAKING: Stable/uncomplicated  EVALUATION COMPLEXITY: Low   GOALS:  SHORT TERM GOALS: Target date: 12/19/2023   Pt will tolerate aquatic therapy without adverse effects for improved strength, mobility, pain, function, and tolerance to activity.  Baseline: Goal status: INITIAL  2.  Pt will perform 5x STS test in no > 50 sec for improved functional LE strength and performance of transfers.  Baseline:  Goal status: INITIAL  3.  Pt will report at least a 25% improvement overall in back and bilat knee pain.  Baseline:  Goal status: INITIAL  4.  Pt will report she is able to perform her daily transfers with no > than minimal difficulty.   Baseline:  Goal status: INITIAL Target date:  12/26/2023    5.  Pt will report at least a 50% improvement in dressing.  Baseline:  Goal status: INITIAL    LONG TERM GOALS: Target date: 01/16/2024   Pt will be able to ambulate extended community distance including performing grocery shopping without significant pain and difficulty.   Baseline:  Goal status: INITIAL  2.  Pt will report she is able to perform her daily transfers without difficulty.  Baseline:  Goal status: INITIAL  3.  Pt will perform 5x STS test in no > 20 sec for improved functional LE strength and performance of transfers.  Baseline:  Goal status: INITIAL  4.  Pt will demo improved bilat hip flexion strength by 5 lbs and hip abd by 10 lbs and knee extension to 5/5 MMT for improved performance of and tolerance with functional mobility.  Baseline:  Goal status:  INITIAL  5.  Pt will be independent with pool program and land based HEP for improved pain, strength, and functional mobility.  Baseline:  Goal status: INITIAL   PLAN:  PT FREQUENCY: 2x/week  PT DURATION: 8 weeks  PLANNED INTERVENTIONS: [id number]- PT Re-evaluation, 97750- Physical Performance Testing, 97110-Therapeutic exercises, 97530- Therapeutic activity, [id number]- Neuromuscular re-education, 97535- Self Care, [id number]- Manual therapy, [phone]- Gait training, [id number]- Aquatic Therapy, Patient/Family education, Stair training, Taping, Scar mobilization, DME instructions, Cryotherapy, and Moist heat.  PLAN FOR NEXT SESSION:  Review current HEP.  Assess tenderness to palpation and soft tissue tightness next visit.  Cont with aquatic therapy.   CARDIAC PACEMAKER--NO E-STIM   Leigh Minerva III PT, DPT 01/04/2024 2:18 PM      "

## 2024-01-10 ENCOUNTER — Ambulatory Visit (HOSPITAL_BASED_OUTPATIENT_CLINIC_OR_DEPARTMENT_OTHER): Attending: Anesthesiology | Admitting: Physical Therapy

## 2024-01-10 ENCOUNTER — Encounter (HOSPITAL_BASED_OUTPATIENT_CLINIC_OR_DEPARTMENT_OTHER): Payer: Self-pay | Admitting: Physical Therapy

## 2024-01-10 DIAGNOSIS — R262 Difficulty in walking, not elsewhere classified: Secondary | ICD-10-CM | POA: Insufficient documentation

## 2024-01-10 DIAGNOSIS — M5459 Other low back pain: Secondary | ICD-10-CM | POA: Diagnosis present

## 2024-01-10 DIAGNOSIS — M25562 Pain in left knee: Secondary | ICD-10-CM | POA: Diagnosis present

## 2024-01-10 DIAGNOSIS — M25561 Pain in right knee: Secondary | ICD-10-CM | POA: Insufficient documentation

## 2024-01-10 DIAGNOSIS — M6281 Muscle weakness (generalized): Secondary | ICD-10-CM | POA: Diagnosis present

## 2024-01-10 NOTE — Therapy (Signed)
 " OUTPATIENT PHYSICAL THERAPY THORACOLUMBAR TREATMENT    Patient Name: Amy Bartlett MRN: [id number] DOB:[birth year], 74 y.o., female Today's Date: 01/10/2024  END OF SESSION:  PT End of Session - 01/10/24 1226     Visit Number 7    Number of Visits 16    Date for Recertification  01/16/24    Authorization Type [employer] MCR    PT Start Time 1200   arrives 15 min late   PT Stop Time 1232    PT Time Calculation (min) 32 min    Activity Tolerance Patient tolerated treatment well    Behavior During Therapy [facility] for tasks assessed/performed            Past Medical History:  Diagnosis Date   Arthritis    Chicken pox    Diabetes mellitus (HCC)    Hyperlipidemia    Hypertension    Hyponatremia - HCTZ 07/15/2020   Osteopenia    Past Surgical History:  Procedure Laterality Date   CARDIAC PACEMAKER PLACEMENT     CESAREAN SECTION  1974, 1977, 1979   REDUCTION MAMMAPLASTY Bilateral 2009   TUBAL LIGATION     Patient Active Problem List   Diagnosis Date Noted   Bilateral primary osteoarthritis of hip 12/08/2023   Gastroesophageal reflux disease 08/31/2023   Peripheral vascular disease with claudication 01/06/2022   Spinal stenosis of lumbar region with neurogenic claudication 01/06/2022   Class 2 severe obesity due to excess calories with serious comorbidity and body mass index (BMI) of 35.0 to 35.9 in adult 03/10/2021   Diabetic peripheral neuropathy associated with type 2 diabetes mellitus (HCC) 12/18/2020   Hyponatremia - HCTZ 07/15/2020   Spondylosis of lumbar region without myelopathy or radiculopathy 07/14/2020   Cardiac pacemaker in situ 03/19/2019   CKD (chronic kidney disease) stage 3, GFR 30-59 ml/min (HCC) 10/04/2016   Obesity (BMI 30-39.9) 01/12/2016   NSVT (nonsustained ventricular tachycardia) 12/05/2015   Primary localized osteoarthrosis of left lower leg 07/16/2014   History of complete heart block 10/04/2012   Combined hyperlipidemia associated with  type 2 diabetes mellitus (HCC) 11/04/2011   Type 2 diabetes mellitus with stage 3b chronic kidney disease, without long-term current use of insulin (HCC) 11/04/2011   Age-related osteoporosis without fracture 06/01/2011   Vitamin D  deficiency 04/16/2011   Hypertension associated with diabetes (HCC) 01/11/2011     REFERRING PROVIDER: Laqueta Ozell BIRCH, MD   REFERRING DIAG: M54.50 (ICD-10-CM) - Low back pain, unspecified   Rationale for Evaluation and Treatment: Rehabilitation  THERAPY DIAG:  Other low back pain  Pain in both knees, unspecified chronicity  Muscle weakness (generalized)  Difficulty in walking, not elsewhere classified  ONSET DATE:  chronic pain / Surgery May 2025  SUBJECTIVE:  SUBJECTIVE STATEMENT: I sat on the edge of a rolling chair and fell off of it right onto my tail a few days ago.  I called MD and he said since I was walking and able to stand I probably just bruised myself.     Initial Subjective Patient had had back pain for years.  Pt states she had degenerative discs.  Pt states she performed aquatic therapy in the 1980's and had significant improvement.  Pt also received aquatic therapy at this location in 2023/2024 which also helped.  Pt underwent L4-5, L5-S1 TLIF in May In [place]  and she thinks it was May 15th.  MD notes indicated pt is feeling improved with regards to her neurogenic claudication and pt verbalized agreement.  Pt received PT in [place]  and she made good improvement.  She recently stopped PT at the end of October because she returned home.  PT order on 8/20 indicated aquatic therapy for low back pain post op.   Pt reports having hip and knee pain with knees being the worst.  Surgeon thought her sx's could be coming from her hips.  Pt has seen  orthopedic for her knees and is seeing orthopedic for her hip on Thursday.  MD who saw her for her knees indicated OPPT to work on quadricep  strengthening.  Pt states orthopedic stated she had arthrities.  He ordered PT. Pt has had diagnostic testing for both though PT unable to view them.  MD note on 9/17 indicated preop radicular pain and neurogenic claudication have resolved and her lumbar spine xrays today are satisfactory.    Pt reports she can only stand for 30 mins or less due to pain > fatigue.  Pt is limited with household chores.  Pt has to use support with AD.  Pt is limited with ambulation and shopping.  Pt uses the motorized cart and sometimes she pushes the cart with grocery shopping.  Pt has difficulty dressing.  She has much difficulty lifting LE's to don shoes and socks.  Pt has difficulty with sit/stands including the toilet seat.   Pt has exercises from prior PT though hasn't been performing them lately.   PERTINENT HISTORY:  L4-5, L5-S1 TLIF in May 2025 CARDIAC PACEMAKER--NO E-STIM Bilat knee pain--Pt states she was informed she has arthritis DM type 2, peripheral neuropathy HTN Osteopenia/osteoporosis CKD  PAIN:  NPRS:  2-3/10 /  0/10 / 1-2/10 Location:  thoracic / lumbar and R knee /  L knee R knee feels great Pt states she gets cramps in the bed.  Pt denies radicular sx's.   PRECAUTIONS: ICD/Pacemaker and Other: lumbar fusion and osteopenia/osteoporosis.    WEIGHT BEARING RESTRICTIONS: No Wb'ing restrictions.  Pt thinks she has a lifting restriction, but not sure about how much.    FALLS:  Has patient fallen in last 6 months? No  LIVING ENVIRONMENT: Lives with: lives with their spouse Lives in: 1 story home Stairs: 4 steps with 1 rail Has following equipment at home: cane, rollator, shower chair, FWW  OCCUPATION: Retired  PLOF: Independent.  Pt ambulated without an AD.   PATIENT GOALS: strengthen LE's, improve mobility   OBJECTIVE:  Note:  Objective measures were completed at Evaluation unless otherwise noted.  DIAGNOSTIC FINDINGS:  Pt is post op.  MD note indicated lumbar spine xrays today are satisfactory.     Pt has received diagnostic testing for hips and knees though PT unable to view them.  PATIENT SURVEYS:  Modified Oswestry Disability Index:  23 =  46%  COGNITION: Overall cognitive status: Within functional limits for tasks assessed     SENSATION: L2-S2:  2+ to LT bilat   OBSERVATION: Incision has healed well.  Incision closed and dry and has no redness.    PALPATION: Assess next visit   LOWER EXTREMITY MMT:    MMT Right eval Left eval  Hip flexion 15.4 13.3  Hip extension    Hip abduction 11.4 15.0  Hip adduction    Hip internal rotation    Hip external rotation    Knee flexion Tol min resistance 5/5 seated  Knee extension 4-/5 4-/5  Ankle dorsiflexion 5/5 5/5  Ankle plantarflexion weak weak  Ankle inversion    Ankle eversion     (Blank rows = not tested)   FUNCTIONAL TESTS:  Pt is very slow with sit to stand transfers.  Pt unable to stand without using Ue's. 5x STS test:  6 sec with UE's  GAIT: Comments: very slow with rollator.    TREATMENT:                                                                                                                                [facility] Adult PT Treatment:                                                DATE: 01/10/24 Pt seen for aquatic therapy today.  Treatment took place in water 3.5-4.75 ft in depth at the [facility] pool. Temp of water was 91.  Pt entered/exited the pool via stairs with cl supervision and  hand rail.   -Addressed goals   *walking forward, back and side stepping in 3.6  ft with ue support of barbell  - squatted rest period *Ue support on wall->barbell: toe raises; heel raises; high knee marching:  hip abdct ; hip flex/ext x10 (good balance challenge)  -squatted rest period *step ups leading R/L x 6 ue support  bilaterally then unilaterally as tolerated.  -seated rest period on 3rd step *cycling on straddled noodle    Pt requires the buoyancy and hydrostatic pressure of water for support, and to offload joints by unweighting joint load by at least 50 % in navel deep water and by at least 75-80% in chest to neck deep water.  Viscosity of the water is needed for resistance of strengthening. Water current perturbations provides challenge to standing balance requiring increased core activation.    12/31  Reviewed pt presentation, pain levels, and HEP compliance.  Nustep lvl 3 bilat UE/LE's  x 5 mins LAQ  2# x15 on R, approx 8 on L Seated Hip abduction with GTB approx 15 Sit to stands from table minimally elevated x5,3 reps without UE support Marching with UE support on back of chair x10 each   [facility] Adult  PT Treatment:                                                DATE: 12/14/23 Pt seen for aquatic therapy today.  Treatment took place in water 3.5-4.75 ft in depth at the [facility] pool. Temp of water was 91.  Pt entered/exited the pool via stairs with cl supervision and  hand rail.   *walking forward, back and side stepping in 3.6  ft with ue support of barbell  - squatted rest period *Ue support on wall: toe raises; heel raises; high knee marching  hip abdct ; hip ext x10  -squatted rest period *step ups leading R/L x 6 ue support bilaterally then unilaterally as tolerated.  -seated rest period on 3rd step *cycling; hip add/abd; flutter kicking on 3rd water step  -seated rest period *forward and backward marching ue support barbell port barbell. Pt reports reduced LB discomfort with increased submersion.   Pt requires the buoyancy and hydrostatic pressure of water for support, and to offload joints by unweighting joint load by at least 50 % in navel deep water and by at least 75-80% in chest to neck deep water.  Viscosity of the water is needed for resistance of strengthening.  Water current perturbations provides challenge to standing balance requiring increased core activation.  Reviewed response to prior treatment, pain level, and current HEP.  Pt is performing sidestepping, LAQ with 1# (sometimes without wt), standing heel/toe raises, sit to stands, seated/supine clams.  LAQ 1# 2x10 Seated hip abd with RTB x 8 reps, GTB  x10, x15 reps Sit to stands with chair in front of her with hands on her LE's Supine bridge x 10 reps S/L clams x 10 bilat   PATIENT EDUCATION:  Education details: dx, exercise form, rationale of interventions, POC, and relevant anatomy.  PT answered pt's questions.   Person educated: Patient Education method: Explanation Education comprehension: verbalized understanding  HOME EXERCISE PROGRAM: Pt has a HEP from prior PT.     ASSESSMENT:  CLINICAL IMPRESSION: Pt arrives late for session limiting treatment time. Goals addressed: Pt reports back pain is better, about 80%.  Left knee slightly better but right knee still bad.  Stays swollen all the time. Pt reports she has gotten a long handle shoe horn that has a hook and has assisted her in dressing improving skill by 80%. She has improved with transfers at home but continues to have moderate difficulty. In light of recent fall pt is encouraged to avoid any rolling chairs and best to use chairs with arms to assist in STS transfers. She progresses through aquatic exercises well requiring minimal cues. She continues to guard right knee with stair climbing although with cues tolerates stair climbing without discomfort and good muscle engagement. Reports good response from prior sessions. Re-cert due next visit.       OBJECTIVE IMPAIRMENTS: Abnormal gait, decreased activity tolerance, decreased balance, decreased endurance, decreased mobility, difficulty walking, decreased strength, and pain.   ACTIVITY LIMITATIONS: carrying, lifting, standing, sleeping, stairs, transfers, dressing, and  locomotion level  PARTICIPATION LIMITATIONS: cleaning, shopping, and community activity  PERSONAL FACTORS: Time since onset of injury/illness/exacerbation and 3+ comorbidities: Pacemaker, osteopenia/osteoporosis, knee pain, arthritis, DM type 2, peripheral neuropathy are also affecting patient's functional outcome.   REHAB POTENTIAL: Good  CLINICAL DECISION MAKING: Stable/uncomplicated  EVALUATION COMPLEXITY: Low  GOALS:  SHORT TERM GOALS: Target date: 12/19/2023   Pt will tolerate aquatic therapy without adverse effects for improved strength, mobility, pain, function, and tolerance to activity.  Baseline: Goal status: Met 01/10/24  2.  Pt will perform 5x STS test in no > 50 sec for improved functional LE strength and performance of transfers.  Baseline:  Goal status: INITIAL  3.  Pt will report at least a 25% improvement overall in back and bilat knee pain.  Baseline:  Goal status: Partially met 01/10/24  4.  Pt will report she is able to perform her daily transfers with no > than minimal difficulty.   Baseline:  Goal status: In progress 01/10/24 Target date:  12/26/2023    5.  Pt will report at least a 50% improvement in dressing.  Baseline:  Goal status: Met 01/10/24    LONG TERM GOALS: Target date: 01/16/2024   Pt will be able to ambulate extended community distance including performing grocery shopping without significant pain and difficulty.   Baseline:  Goal status: INITIAL  2.  Pt will report she is able to perform her daily transfers without difficulty.  Baseline:  Goal status: INITIAL  3.  Pt will perform 5x STS test in no > 20 sec for improved functional LE strength and performance of transfers.  Baseline:  Goal status: INITIAL  4.  Pt will demo improved bilat hip flexion strength by 5 lbs and hip abd by 10 lbs and knee extension to 5/5 MMT for improved performance of and tolerance with functional mobility.  Baseline:  Goal status: INITIAL  5.  Pt will  be independent with pool program and land based HEP for improved pain, strength, and functional mobility.  Baseline:  Goal status: INITIAL   PLAN:  PT FREQUENCY: 2x/week  PT DURATION: 8 weeks  PLANNED INTERVENTIONS: [id number]- PT Re-evaluation, 97750- Physical Performance Testing, 97110-Therapeutic exercises, 97530- Therapeutic activity, [id number]- Neuromuscular re-education, 97535- Self Care, [id number]- Manual therapy, [phone]- Gait training, [phone]- Aquatic Therapy, Patient/Family education, Stair training, Taping, Scar mobilization, DME instructions, Cryotherapy, and Moist heat.  PLAN FOR NEXT SESSION:  Review current HEP.  Assess tenderness to palpation and soft tissue tightness next visit.  Cont with aquatic therapy.   CARDIAC PACEMAKER--NO E-STIM   Ronal Tuttle) Saadiq Poche MPT 01/10/2024 12:42 PM [facility] [address] [place], [place], [address] Phone: [phone]   Fax:  [phone]       "

## 2024-01-12 ENCOUNTER — Ambulatory Visit (HOSPITAL_BASED_OUTPATIENT_CLINIC_OR_DEPARTMENT_OTHER): Admitting: Physical Therapy

## 2024-01-12 DIAGNOSIS — M6281 Muscle weakness (generalized): Secondary | ICD-10-CM

## 2024-01-12 DIAGNOSIS — M5459 Other low back pain: Secondary | ICD-10-CM | POA: Diagnosis not present

## 2024-01-12 DIAGNOSIS — R262 Difficulty in walking, not elsewhere classified: Secondary | ICD-10-CM

## 2024-01-12 DIAGNOSIS — M25561 Pain in right knee: Secondary | ICD-10-CM

## 2024-01-12 NOTE — Therapy (Signed)
 " OUTPATIENT PHYSICAL THERAPY THORACOLUMBAR TREATMENT    Patient Name: Amy Bartlett MRN: [id number] DOB:1950-03-17, 74 y.o., female Today's Date: 01/13/2024  END OF SESSION:  PT End of Session - 01/12/24 1116     Visit Number 8    Number of Visits 8    Date for Recertification  02/16/24    Authorization Type [employer] MCR    PT Start Time 1111    PT Stop Time 1155    PT Time Calculation (min) 44 min    Activity Tolerance Patient tolerated treatment well    Behavior During Therapy [facility] for tasks assessed/performed            Past Medical History:  Diagnosis Date   Arthritis    Chicken pox    Diabetes mellitus (HCC)    Hyperlipidemia    Hypertension    Hyponatremia - HCTZ 07/15/2020   Osteopenia    Past Surgical History:  Procedure Laterality Date   CARDIAC PACEMAKER PLACEMENT     CESAREAN SECTION  1974, 1977, 1979   REDUCTION MAMMAPLASTY Bilateral 2009   TUBAL LIGATION     Patient Active Problem List   Diagnosis Date Noted   Bilateral primary osteoarthritis of hip 12/08/2023   Gastroesophageal reflux disease 08/31/2023   Peripheral vascular disease with claudication 01/06/2022   Spinal stenosis of lumbar region with neurogenic claudication 01/06/2022   Class 2 severe obesity due to excess calories with serious comorbidity and body mass index (BMI) of 35.0 to 35.9 in adult 03/10/2021   Diabetic peripheral neuropathy associated with type 2 diabetes mellitus (HCC) 12/18/2020   Hyponatremia - HCTZ 07/15/2020   Spondylosis of lumbar region without myelopathy or radiculopathy 07/14/2020   Cardiac pacemaker in situ 03/19/2019   CKD (chronic kidney disease) stage 3, GFR 30-59 ml/min (HCC) 10/04/2016   Obesity (BMI 30-39.9) 01/12/2016   NSVT (nonsustained ventricular tachycardia) 12/05/2015   Primary localized osteoarthrosis of left lower leg 07/16/2014   History of complete heart block 10/04/2012   Combined hyperlipidemia associated with type 2 diabetes mellitus  (HCC) 11/04/2011   Type 2 diabetes mellitus with stage 3b chronic kidney disease, without long-term current use of insulin (HCC) 11/04/2011   Age-related osteoporosis without fracture 06/01/2011   Vitamin D  deficiency 04/16/2011   Hypertension associated with diabetes (HCC) 01/11/2011     REFERRING PROVIDER: Laqueta Ozell BIRCH, MD   REFERRING DIAG: M54.50 (ICD-10-CM) - Low back pain, unspecified   Rationale for Evaluation and Treatment: Rehabilitation  THERAPY DIAG:  Other low back pain  Pain in both knees, unspecified chronicity  Muscle weakness (generalized)  Difficulty in walking, not elsewhere classified  ONSET DATE:  chronic pain / Surgery May 2025  SUBJECTIVE:  SUBJECTIVE STATEMENT: Pt slid out of her rolling chair and fell off onto her tailbone.  She called [facility] and spoke with the on-call MD.  She was told it was probably bruised.  Pt states it was painful though her pain is improving.  She still has difficulty and pain lifting L LE from a sitting position.  Pt states her R knee stays swollen.    PERTINENT HISTORY:  L4-5, L5-S1 TLIF in May 2025 CARDIAC PACEMAKER--NO E-STIM Bilat knee pain--Pt states she was informed she has arthritis DM type 2, peripheral neuropathy HTN Osteopenia/osteoporosis CKD  PAIN:  NPRS:  not much pain at all in sitting / 3/10 pain in L hip/groin and R knee with ambulation Location:  see above   PRECAUTIONS: ICD/Pacemaker and Other: lumbar fusion and osteopenia/osteoporosis.    WEIGHT BEARING RESTRICTIONS: No Wb'ing restrictions.  Pt thinks she has a lifting restriction, but not sure about how much.    FALLS:  Has patient fallen in last 6 months? No  LIVING ENVIRONMENT: Lives with: lives with their spouse Lives in: 1 story home Stairs: 4  steps with 1 rail Has following equipment at home: cane, rollator, shower chair, FWW  OCCUPATION: Retired  PLOF: Independent.  Pt ambulated without an AD.   PATIENT GOALS: strengthen LE's, improve mobility   OBJECTIVE:  Note: Objective measures were completed at Evaluation unless otherwise noted.  DIAGNOSTIC FINDINGS:  Pt is post op.  MD note indicated lumbar spine xrays today are satisfactory.     Pt has received diagnostic testing for hips and knees though PT unable to view them.  PATIENT SURVEYS:  Modified Oswestry Disability Index:  23 = 46%  COGNITION: Overall cognitive status: Within functional limits for tasks assessed     SENSATION: L2-S2:  2+ to LT bilat   OBSERVATION: Incision has healed well.  Incision closed and dry and has no redness.    PALPATION: Assess next visit   LOWER EXTREMITY MMT:    MMT Right eval Left eval  Hip flexion 15.4 13.3  Hip extension    Hip abduction 11.4 15.0  Hip adduction    Hip internal rotation    Hip external rotation    Knee flexion Tol min resistance 5/5 seated  Knee extension 4-/5 4-/5  Ankle dorsiflexion 5/5 5/5  Ankle plantarflexion weak weak  Ankle inversion    Ankle eversion     (Blank rows = not tested)   FUNCTIONAL TESTS:  Pt is very slow with sit to stand transfers.  Pt unable to stand without using Ue's. 5x STS test:  6 sec with UE's  GAIT: Comments: very slow with rollator.    TREATMENT:                                                                                                                                01/12/24 Nustep lvl 3 bilat UE/LE's  x 6 mins LAQ  R: 0#  x10, 1# x10, L: 2# x10, 1# x10 Seated HS curl R:  RTB x10, YTB x10, L:  RTGB 2x10 Seated Hip abduction with GTB 3x10 Marching with UE support on back of chair x10 each Sidestepping with TrA with UE support on rail x 2 laps Standing heel raises with TrA 2x10  See below for pt education     [facility] Adult PT Treatment:                                                 DATE: 01/10/24 Pt seen for aquatic therapy today.  Treatment took place in water 3.5-4.75 ft in depth at the [facility] pool. Temp of water was 91.  Pt entered/exited the pool via stairs with cl supervision and  hand rail.   -Addressed goals   *walking forward, back and side stepping in 3.6  ft with ue support of barbell  - squatted rest period *Ue support on wall->barbell: toe raises; heel raises; high knee marching:  hip abdct ; hip flex/ext x10 (good balance challenge)  -squatted rest period *step ups leading R/L x 6 ue support bilaterally then unilaterally as tolerated.  -seated rest period on 3rd step *cycling on straddled noodle    Pt requires the buoyancy and hydrostatic pressure of water for support, and to offload joints by unweighting joint load by at least 50 % in navel deep water and by at least 75-80% in chest to neck deep water.  Viscosity of the water is needed for resistance of strengthening. Water current perturbations provides challenge to standing balance requiring increased core activation.    12/31  Reviewed pt presentation, pain levels, and HEP compliance.  Nustep lvl 3 bilat UE/LE's  x 5 mins LAQ  2# x15 on R, approx 8 on L Seated Hip abduction with GTB approx 15 Sit to stands from table minimally elevated x5,3 reps without UE support Marching with UE support on back of chair x10 each   [facility] Adult PT Treatment:                                                DATE: 12/14/23 Pt seen for aquatic therapy today.  Treatment took place in water 3.5-4.75 ft in depth at the [facility] pool. Temp of water was 91.  Pt entered/exited the pool via stairs with cl supervision and  hand rail.   *walking forward, back and side stepping in 3.6  ft with ue support of barbell  - squatted rest period *Ue support on wall: toe raises; heel raises; high knee marching  hip abdct ; hip ext x10  -squatted rest period *step ups  leading R/L x 6 ue support bilaterally then unilaterally as tolerated.  -seated rest period on 3rd step *cycling; hip add/abd; flutter kicking on 3rd water step  -seated rest period *forward and backward marching ue support barbell port barbell. Pt reports reduced LB discomfort with increased submersion.   Pt requires the buoyancy and hydrostatic pressure of water for support, and to offload joints by unweighting joint load by at least 50 % in navel deep water and by at least 75-80% in chest to neck deep water.  Viscosity of the water is needed for resistance  of strengthening. Water current perturbations provides challenge to standing balance requiring increased core activation.  Reviewed response to prior treatment, pain level, and current HEP.  Pt is performing sidestepping, LAQ with 1# (sometimes without wt), standing heel/toe raises, sit to stands, seated/supine clams.  LAQ 1# 2x10 Seated hip abd with RTB x 8 reps, GTB  x10, x15 reps Sit to stands with chair in front of her with hands on her LE's Supine bridge x 10 reps S/L clams x 10 bilat   PATIENT EDUCATION:  Education details: dx, exercise form, rationale of interventions, POC, and relevant anatomy.  PT answered pt's questions.   Person educated: Patient Education method: Explanation Education comprehension: verbalized understanding  HOME EXERCISE PROGRAM: Pt has a HEP from prior PT.     ASSESSMENT:  CLINICAL IMPRESSION: Pt continues to be slow with mobility and has limited tolerance to activity.  She continues to have decreased LE strength, pain, and limited mobility.  The majority of STG's were assessed last visit.  Pt had met 2/5 STG's and partially met 1/5 STG's.  PT provided cuing and instruction in correct form with exercises.  Her L quad fatigued with 2# on LAQ.  PT decreased wt to 1# and she felt better performing LAQ.  Pt tolerated exercises well and was fatigued with treatment.  She had no tailbone pain during or after  treatment.  Pt has been receiving land based and aquatic therapy and should benefit from continued PT with land and aquatic therapy to address impairments and goals, reduce pain, and to maximize functional mobility.        OBJECTIVE IMPAIRMENTS: Abnormal gait, decreased activity tolerance, decreased balance, decreased endurance, decreased mobility, difficulty walking, decreased strength, and pain.   ACTIVITY LIMITATIONS: carrying, lifting, standing, sleeping, stairs, transfers, dressing, and locomotion level  PARTICIPATION LIMITATIONS: cleaning, shopping, and community activity  PERSONAL FACTORS: Time since onset of injury/illness/exacerbation and 3+ comorbidities: Pacemaker, osteopenia/osteoporosis, knee pain, arthritis, DM type 2, peripheral neuropathy are also affecting patient's functional outcome.   REHAB POTENTIAL: Good  CLINICAL DECISION MAKING: Stable/uncomplicated  EVALUATION COMPLEXITY: Low   GOALS:  SHORT TERM GOALS: Target date: 12/19/2023   Pt will tolerate aquatic therapy without adverse effects for improved strength, mobility, pain, function, and tolerance to activity.  Baseline: Goal status: Met 01/10/24  2.  Pt will perform 5x STS test in no > 50 sec for improved functional LE strength and performance of transfers.  Baseline:  Goal status: INITIAL  3.  Pt will report at least a 25% improvement overall in back and bilat knee pain.  Baseline:  Goal status: Partially met 01/10/24  4.  Pt will report she is able to perform her daily transfers with no > than minimal difficulty.   Baseline:  Goal status: In progress 01/10/24 Target date:  12/26/2023    5.  Pt will report at least a 50% improvement in dressing.  Baseline:  Goal status: Met 01/10/24    LONG TERM GOALS: Target date: 02/16/2024   Pt will be able to ambulate extended community distance including performing grocery shopping without significant pain and difficulty.   Baseline:  Goal status:  INITIAL  2.  Pt will report she is able to perform her daily transfers without difficulty.  Baseline:  Goal status: INITIAL  3.  Pt will perform 5x STS test in no > 20 sec for improved functional LE strength and performance of transfers.  Baseline:  Goal status: INITIAL  4.  Pt will demo improved bilat  hip flexion strength by 5 lbs and hip abd by 10 lbs and knee extension to 5/5 MMT for improved performance of and tolerance with functional mobility.  Baseline:  Goal status: INITIAL  5.  Pt will be independent with pool program and land based HEP for improved pain, strength, and functional mobility.  Baseline:  Goal status: INITIAL   PLAN:  PT FREQUENCY: 2x/week  PT DURATION: 5 weeks  PLANNED INTERVENTIONS: [id number]- PT Re-evaluation, 97750- Physical Performance Testing, 97110-Therapeutic exercises, 97530- Therapeutic activity, [id number]- Neuromuscular re-education, 97535- Self Care, [id number]- Manual therapy, [phone]- Gait training, [phone]- Aquatic Therapy, Patient/Family education, Stair training, Taping, Scar mobilization, DME instructions, Cryotherapy, and Moist heat.  PLAN FOR NEXT SESSION:  Assess tenderness to palpation and soft tissue tightness next visit.  Cont with aquatic therapy and land based therapy.   CARDIAC PACEMAKER--NO E-STIM   Leigh Minerva III PT, DPT 01/13/2024 4:06 PM  [facility] [address] [place], [place], [address] Phone: [phone]   Fax:  [phone]       "

## 2024-01-13 ENCOUNTER — Encounter (HOSPITAL_BASED_OUTPATIENT_CLINIC_OR_DEPARTMENT_OTHER): Payer: Self-pay | Admitting: Physical Therapy

## 2024-01-17 ENCOUNTER — Ambulatory Visit (HOSPITAL_BASED_OUTPATIENT_CLINIC_OR_DEPARTMENT_OTHER): Admitting: Physical Therapy

## 2024-01-17 ENCOUNTER — Encounter (HOSPITAL_BASED_OUTPATIENT_CLINIC_OR_DEPARTMENT_OTHER): Payer: Self-pay | Admitting: Physical Therapy

## 2024-01-17 DIAGNOSIS — M5459 Other low back pain: Secondary | ICD-10-CM | POA: Diagnosis not present

## 2024-01-17 DIAGNOSIS — M25561 Pain in right knee: Secondary | ICD-10-CM

## 2024-01-17 DIAGNOSIS — M6281 Muscle weakness (generalized): Secondary | ICD-10-CM

## 2024-01-17 NOTE — Therapy (Signed)
 " OUTPATIENT PHYSICAL THERAPY THORACOLUMBAR TREATMENT    Patient Name: Amy Bartlett MRN: [id number] DOB:17-Sep-1950, 74 y.o., female Today's Date: 01/17/2024  END OF SESSION:  PT End of Session - 01/17/24 1121     Visit Number 9    Number of Visits --    Date for Recertification  02/16/24    Authorization Type [employer] MCR    PT Start Time 1110    PT Stop Time 1148    PT Time Calculation (min) 38 min    Activity Tolerance Patient tolerated treatment well    Behavior During Therapy WFL for tasks assessed/performed             Past Medical History:  Diagnosis Date   Arthritis    Chicken pox    Diabetes mellitus (HCC)    Hyperlipidemia    Hypertension    Hyponatremia - HCTZ 07/15/2020   Osteopenia    Past Surgical History:  Procedure Laterality Date   CARDIAC PACEMAKER PLACEMENT     CESAREAN SECTION  1974, 1977, 1979   REDUCTION MAMMAPLASTY Bilateral 2009   TUBAL LIGATION     Patient Active Problem List   Diagnosis Date Noted   Bilateral primary osteoarthritis of hip 12/08/2023   Gastroesophageal reflux disease 08/31/2023   Peripheral vascular disease with claudication 01/06/2022   Spinal stenosis of lumbar region with neurogenic claudication 01/06/2022   Class 2 severe obesity due to excess calories with serious comorbidity and body mass index (BMI) of 35.0 to 35.9 in adult 03/10/2021   Diabetic peripheral neuropathy associated with type 2 diabetes mellitus (HCC) 12/18/2020   Hyponatremia - HCTZ 07/15/2020   Spondylosis of lumbar region without myelopathy or radiculopathy 07/14/2020   Cardiac pacemaker in situ 03/19/2019   CKD (chronic kidney disease) stage 3, GFR 30-59 ml/min (HCC) 10/04/2016   Obesity (BMI 30-39.9) 01/12/2016   NSVT (nonsustained ventricular tachycardia) 12/05/2015   Primary localized osteoarthrosis of left lower leg 07/16/2014   History of complete heart block 10/04/2012   Combined hyperlipidemia associated with type 2 diabetes  mellitus (HCC) 11/04/2011   Type 2 diabetes mellitus with stage 3b chronic kidney disease, without long-term current use of insulin (HCC) 11/04/2011   Age-related osteoporosis without fracture 06/01/2011   Vitamin D  deficiency 04/16/2011   Hypertension associated with diabetes (HCC) 01/11/2011     REFERRING PROVIDER: Laqueta Ozell BIRCH, MD   REFERRING DIAG: M54.50 (ICD-10-CM) - Low back pain, unspecified   Rationale for Evaluation and Treatment: Rehabilitation  THERAPY DIAG:  Other low back pain  Pain in both knees, unspecified chronicity  Muscle weakness (generalized)  ONSET DATE:  chronic pain / Surgery May 2025  SUBJECTIVE:  SUBJECTIVE STATEMENT: Pt reports no knee pain today only first thing in am when getting out of bed 5/10 reduces to 0/10 after moving around    PERTINENT HISTORY:  L4-5, L5-S1 TLIF in May 2025 CARDIAC PACEMAKER--NO E-STIM Bilat knee pain--Pt states she was informed she has arthritis DM type 2, peripheral neuropathy HTN Osteopenia/osteoporosis CKD  PAIN:  NPRS:  not much pain at all in sitting / 3/10 pain in L hip/groin and R knee with ambulation Location:  see above   PRECAUTIONS: ICD/Pacemaker and Other: lumbar fusion and osteopenia/osteoporosis.    WEIGHT BEARING RESTRICTIONS: No Wb'ing restrictions.  Pt thinks she has a lifting restriction, but not sure about how much.    FALLS:  Has patient fallen in last 6 months? No  LIVING ENVIRONMENT: Lives with: lives with their spouse Lives in: 1 story home Stairs: 4 steps with 1 rail Has following equipment at home: cane, rollator, shower chair, FWW  OCCUPATION: Retired  PLOF: Independent.  Pt ambulated without an AD.   PATIENT GOALS: strengthen LE's, improve mobility   OBJECTIVE:  Note: Objective  measures were completed at Evaluation unless otherwise noted.  DIAGNOSTIC FINDINGS:  Pt is post op.  MD note indicated lumbar spine xrays today are satisfactory.     Pt has received diagnostic testing for hips and knees though PT unable to view them.  PATIENT SURVEYS:  Modified Oswestry Disability Index:  23 = 46%  COGNITION: Overall cognitive status: Within functional limits for tasks assessed     SENSATION: L2-S2:  2+ to LT bilat   OBSERVATION: Incision has healed well.  Incision closed and dry and has no redness.    PALPATION: Assess next visit   LOWER EXTREMITY MMT:    MMT Right eval Left eval  Hip flexion 15.4 13.3  Hip extension    Hip abduction 11.4 15.0  Hip adduction    Hip internal rotation    Hip external rotation    Knee flexion Tol min resistance 5/5 seated  Knee extension 4-/5 4-/5  Ankle dorsiflexion 5/5 5/5  Ankle plantarflexion weak weak  Ankle inversion    Ankle eversion     (Blank rows = not tested)   FUNCTIONAL TESTS:  Pt is very slow with sit to stand transfers.  Pt unable to stand without using Ue's. 5x STS test:  6 sec with UE's  GAIT: Comments: very slow with rollator.    TREATMENT:                                                                                                                               [facility] Adult [facility] Treatment:                                                DATE: 01/17/24 Pt seen for aquatic  therapy today.  Treatment took place in water 3.5-4.75 ft in depth at the [facility] pool. Temp of water was 91.  Pt entered/exited the pool via stairs with cl supervision and  hand rail.   *walking forward, back and side stepping in 3.6  ft with ue support of barbell  - squatted rest period *Ue support barbell: toe raises; heel raises; high knee marching:  hip abdct 3 x5; hip flex/ext x10 (good balance challenge)  -squatted rest period *step ups leading R/L x 10 ue support bilaterally then unilaterally as  tolerated-> 4-5 reps leading R/L unsupported (good challenge) *stair tapping bottom step R/L unsupported after a few trails x 10 L/R. Advanced to 2nd step tap, requires ue support to get started.  Able to complete x 5 unsteady but no LOB. Good challenge. *cycling on straddled noodle    Pt requires the buoyancy and hydrostatic pressure of water for support, and to offload joints by unweighting joint load by at least 50 % in navel deep water and by at least 75-80% in chest to neck deep water.  Viscosity of the water is needed for resistance of strengthening. Water current perturbations provides challenge to standing balance requiring increased core activation.   01/12/24 Nustep lvl 3 bilat UE/LE's  x 6 mins LAQ  R: 0# x10, 1# x10, L: 2# x10, 1# x10 Seated HS curl R:  RTB x10, YTB x10, L:  RTGB 2x10 Seated Hip abduction with GTB 3x10 Marching with UE support on back of chair x10 each Sidestepping with TrA with UE support on rail x 2 laps Standing heel raises with TrA 2x10  See below for pt education     [facility] Adult PT Treatment:                                                DATE: 01/10/24 Pt seen for aquatic therapy today.  Treatment took place in water 3.5-4.75 ft in depth at the [facility] pool. Temp of water was 91.  Pt entered/exited the pool via stairs with cl supervision and  hand rail.   -Addressed goals   *walking forward, back and side stepping in 3.6  ft with ue support of barbell  - squatted rest period *Ue support on wall->barbell: toe raises; heel raises; high knee marching:  hip abdct ; hip flex/ext x10 (good balance challenge)  -squatted rest period *step ups leading R/L x 6 ue support bilaterally then unilaterally as tolerated.  -seated rest period on 3rd step *cycling on straddled noodle    Pt requires the buoyancy and hydrostatic pressure of water for support, and to offload joints by unweighting joint load by at least 50 % in navel deep water and by at least  75-80% in chest to neck deep water.  Viscosity of the water is needed for resistance of strengthening. Water current perturbations provides challenge to standing balance requiring increased core activation.    12/31  Reviewed pt presentation, pain levels, and HEP compliance.  Nustep lvl 3 bilat UE/LE's  x 5 mins LAQ  2# x15 on R, approx 8 on L Seated Hip abduction with GTB approx 15 Sit to stands from table minimally elevated x5,3 reps without UE support Marching with UE support on back of chair x10 each   [facility] Adult PT Treatment:  DATE: 12/14/23 Pt seen for aquatic therapy today.  Treatment took place in water 3.5-4.75 ft in depth at the [facility] pool. Temp of water was 91.  Pt entered/exited the pool via stairs with cl supervision and  hand rail.   *walking forward, back and side stepping in 3.6  ft with ue support of barbell  - squatted rest period *Ue support on wall: toe raises; heel raises; high knee marching  hip abdct ; hip ext x10  -squatted rest period *step ups leading R/L x 6 ue support bilaterally then unilaterally as tolerated.  -seated rest period on 3rd step *cycling; hip add/abd; flutter kicking on 3rd water step  -seated rest period *forward and backward marching ue support barbell port barbell. Pt reports reduced LB discomfort with increased submersion.   Pt requires the buoyancy and hydrostatic pressure of water for support, and to offload joints by unweighting joint load by at least 50 % in navel deep water and by at least 75-80% in chest to neck deep water.  Viscosity of the water is needed for resistance of strengthening. Water current perturbations provides challenge to standing balance requiring increased core activation.  Reviewed response to prior treatment, pain level, and current HEP.  Pt is performing sidestepping, LAQ with 1# (sometimes without wt), standing heel/toe raises, sit to stands,  seated/supine clams.  LAQ 1# 2x10 Seated hip abd with RTB x 8 reps, GTB  x10, x15 reps Sit to stands with chair in front of her with hands on her LE's Supine bridge x 10 reps S/L clams x 10 bilat   PATIENT EDUCATION:  Education details: dx, exercise form, rationale of interventions, POC, and relevant anatomy.  PT answered pt's questions.   Person educated: Patient Education method: Explanation Education comprehension: verbalized understanding  HOME EXERCISE PROGRAM: Pt has a HEP from prior PT.     ASSESSMENT:  CLINICAL IMPRESSION: Good session as pt completes increased LE strengthening and proprioceptive training well. She demonstrates good balance challenge with alternating stair tapping. She is very inclined to trial balance positions submerged as she is confident she will not fall in setting allowing water to be an excellent setting to practice Goals ongoing  PN:Pt continues to be slow with mobility and has limited tolerance to activity.  She continues to have decreased LE strength, pain, and limited mobility.  The majority of STG's were assessed last visit.  Pt had met 2/5 STG's and partially met 1/5 STG's.  PT provided cuing and instruction in correct form with exercises.  Her L quad fatigued with 2# on LAQ.  PT decreased wt to 1# and she felt better performing LAQ.  Pt tolerated exercises well and was fatigued with treatment.  She had no tailbone pain during or after treatment.  Pt has been receiving land based and aquatic therapy and should benefit from continued PT with land and aquatic therapy to address impairments and goals, reduce pain, and to maximize functional mobility.        OBJECTIVE IMPAIRMENTS: Abnormal gait, decreased activity tolerance, decreased balance, decreased endurance, decreased mobility, difficulty walking, decreased strength, and pain.   ACTIVITY LIMITATIONS: carrying, lifting, standing, sleeping, stairs, transfers, dressing, and locomotion  level  PARTICIPATION LIMITATIONS: cleaning, shopping, and community activity  PERSONAL FACTORS: Time since onset of injury/illness/exacerbation and 3+ comorbidities: Pacemaker, osteopenia/osteoporosis, knee pain, arthritis, DM type 2, peripheral neuropathy are also affecting patient's functional outcome.   REHAB POTENTIAL: Good  CLINICAL DECISION MAKING: Stable/uncomplicated  EVALUATION COMPLEXITY: Low   GOALS:  SHORT TERM GOALS: Target date: 12/19/2023   Pt will tolerate aquatic therapy without adverse effects for improved strength, mobility, pain, function, and tolerance to activity.  Baseline: Goal status: Met 01/10/24  2.  Pt will perform 5x STS test in no > 50 sec for improved functional LE strength and performance of transfers.  Baseline:  Goal status: INITIAL  3.  Pt will report at least a 25% improvement overall in back and bilat knee pain.  Baseline:  Goal status: Partially met 01/10/24  4.  Pt will report she is able to perform her daily transfers with no > than minimal difficulty.   Baseline:  Goal status: In progress 01/10/24 Target date:  12/26/2023    5.  Pt will report at least a 50% improvement in dressing.  Baseline:  Goal status: Met 01/10/24    LONG TERM GOALS: Target date: 02/16/2024   Pt will be able to ambulate extended community distance including performing grocery shopping without significant pain and difficulty.   Baseline:  Goal status: INITIAL  2.  Pt will report she is able to perform her daily transfers without difficulty.  Baseline:  Goal status: INITIAL  3.  Pt will perform 5x STS test in no > 20 sec for improved functional LE strength and performance of transfers.  Baseline:  Goal status: INITIAL  4.  Pt will demo improved bilat hip flexion strength by 5 lbs and hip abd by 10 lbs and knee extension to 5/5 MMT for improved performance of and tolerance with functional mobility.  Baseline:  Goal status: INITIAL  5.  Pt will be  independent with pool program and land based HEP for improved pain, strength, and functional mobility.  Baseline:  Goal status: INITIAL   PLAN:  PT FREQUENCY: 2x/week  PT DURATION: 5 weeks  PLANNED INTERVENTIONS: [id number]- PT Re-evaluation, 97750- Physical Performance Testing, 97110-Therapeutic exercises, 97530- Therapeutic activity, [id number]- Neuromuscular re-education, 97535- Self Care, [id number]- Manual therapy, [phone]- Gait training, [phone]- Aquatic Therapy, Patient/Family education, Stair training, Taping, Scar mobilization, DME instructions, Cryotherapy, and Moist heat.  PLAN FOR NEXT SESSION:  Assess tenderness to palpation and soft tissue tightness next visit.  Cont with aquatic therapy and land based therapy.   CARDIAC PACEMAKER--NO E-STIM   Ronal Oakwood) Zebulen Simonis MPT 01/17/2024 11:54 AM [facility] [address] [place], [place], [address] Phone: [phone]   Fax:  [phone]        "

## 2024-01-19 ENCOUNTER — Encounter (HOSPITAL_BASED_OUTPATIENT_CLINIC_OR_DEPARTMENT_OTHER): Payer: Self-pay | Admitting: Physical Therapy

## 2024-01-19 ENCOUNTER — Ambulatory Visit (HOSPITAL_BASED_OUTPATIENT_CLINIC_OR_DEPARTMENT_OTHER): Admitting: Physical Therapy

## 2024-01-19 DIAGNOSIS — R262 Difficulty in walking, not elsewhere classified: Secondary | ICD-10-CM

## 2024-01-19 DIAGNOSIS — M25561 Pain in right knee: Secondary | ICD-10-CM

## 2024-01-19 DIAGNOSIS — M5459 Other low back pain: Secondary | ICD-10-CM

## 2024-01-19 DIAGNOSIS — M6281 Muscle weakness (generalized): Secondary | ICD-10-CM

## 2024-01-19 NOTE — Therapy (Signed)
 " OUTPATIENT PHYSICAL THERAPY THORACOLUMBAR TREATMENT  Progress Note Reporting Period 11/20/24 to 01/19/2024  See note below for Objective Data and Assessment of Progress/Goals.       Patient Name: Amy Bartlett MRN: [id number] DOB:06/26/1950, 74 y.o., female Today's Date: 01/20/2024  END OF SESSION:  PT End of Session - 01/19/24 1120     Visit Number 10    Number of Visits 18    Date for Recertification  02/16/24    Authorization Type [employer] MCR    PT Start Time 1115   Pt arrived 14 mins late to treatment.   PT Stop Time 1149    PT Time Calculation (min) 34 min    Activity Tolerance Patient tolerated treatment well    Behavior During Therapy WFL for tasks assessed/performed              Past Medical History:  Diagnosis Date   Arthritis    Chicken pox    Diabetes mellitus (HCC)    Hyperlipidemia    Hypertension    Hyponatremia - HCTZ 07/15/2020   Osteopenia    Past Surgical History:  Procedure Laterality Date   CARDIAC PACEMAKER PLACEMENT     CESAREAN SECTION  1974, 1977, 1979   REDUCTION MAMMAPLASTY Bilateral 2009   TUBAL LIGATION     Patient Active Problem List   Diagnosis Date Noted   Bilateral primary osteoarthritis of hip 12/08/2023   Gastroesophageal reflux disease 08/31/2023   Peripheral vascular disease with claudication 01/06/2022   Spinal stenosis of lumbar region with neurogenic claudication 01/06/2022   Class 2 severe obesity due to excess calories with serious comorbidity and body mass index (BMI) of 35.0 to 35.9 in adult 03/10/2021   Diabetic peripheral neuropathy associated with type 2 diabetes mellitus (HCC) 12/18/2020   Hyponatremia - HCTZ 07/15/2020   Spondylosis of lumbar region without myelopathy or radiculopathy 07/14/2020   Cardiac pacemaker in situ 03/19/2019   CKD (chronic kidney disease) stage 3, GFR 30-59 ml/min (HCC) 10/04/2016   Obesity (BMI 30-39.9) 01/12/2016   NSVT (nonsustained ventricular tachycardia) 12/05/2015    Primary localized osteoarthrosis of left lower leg 07/16/2014   History of complete heart block 10/04/2012   Combined hyperlipidemia associated with type 2 diabetes mellitus (HCC) 11/04/2011   Type 2 diabetes mellitus with stage 3b chronic kidney disease, without long-term current use of insulin (HCC) 11/04/2011   Age-related osteoporosis without fracture 06/01/2011   Vitamin D  deficiency 04/16/2011   Hypertension associated with diabetes (HCC) 01/11/2011     REFERRING PROVIDER: Laqueta Ozell BIRCH, MD   REFERRING DIAG: M54.50 (ICD-10-CM) - Low back pain, unspecified   Rationale for Evaluation and Treatment: Rehabilitation  THERAPY DIAG:  Other low back pain  Pain in both knees, unspecified chronicity  Muscle weakness (generalized)  Difficulty in walking, not elsewhere classified  ONSET DATE:  chronic pain / Surgery May 2025  SUBJECTIVE:  SUBJECTIVE STATEMENT: Pt states the pain in her L hip/groin was better after the injection though the pain has worsened lately.  Pt states her R hip/groin is not bothering her.  Pt states her hips/leg/knees wake her up.  Pt states her tailbone is sore.  Pt denies any adverse effects after prior treatment.  Pt reports moderate difficulty with daily transfers.  Pt reports improved dressing and standing     PERTINENT HISTORY:  L4-5, L5-S1 TLIF in May 2025 CARDIAC PACEMAKER--NO E-STIM Bilat knee pain--Pt states she was informed she has arthritis DM type 2, peripheral neuropathy HTN Osteopenia/osteoporosis CKD  PAIN:  NPRS:  Lumbar:  0/10 current, 5-6/10 worst.  L hip/groin:  3/10 current, 7-8/10 worst Location:  see above   PRECAUTIONS: ICD/Pacemaker and Other: lumbar fusion and osteopenia/osteoporosis.    WEIGHT BEARING RESTRICTIONS: No Wb'ing  restrictions.  Pt thinks she has a lifting restriction, but not sure about how much.    FALLS:  Has patient fallen in last 6 months? No  LIVING ENVIRONMENT: Lives with: lives with their spouse Lives in: 1 story home Stairs: 4 steps with 1 rail Has following equipment at home: cane, rollator, shower chair, FWW  OCCUPATION: Retired  PLOF: Independent.  Pt ambulated without an AD.   PATIENT GOALS: strengthen LE's, improve mobility   OBJECTIVE:  Note: Objective measures were completed at Evaluation unless otherwise noted.  DIAGNOSTIC FINDINGS:  Pt is post op.  MD note indicated lumbar spine xrays today are satisfactory.     Pt has received diagnostic testing for hips and knees though PT unable to view them.  PATIENT SURVEYS:  Modified Oswestry Disability Index:  23 = 46%  (Eval)          26 = 52%   (01/19/24)  COGNITION: Overall cognitive status: Within functional limits for tasks assessed     SENSATION: L2-S2:  2+ to LT bilat   OBSERVATION: Incision has healed well.  Incision closed and dry and has no redness.    PALPATION: Assess next visit   LOWER EXTREMITY MMT:    MMT Right eval Left eval Right 1/15 Left 1/15  Hip flexion 15.4 13.3 25.8 21.2  Hip extension      Hip abduction 11.4 15.0 15.4 18.6  Hip adduction      Hip internal rotation      Hip external rotation      Knee flexion Tol min resistance 5/5 seated Tol min resistance 5/5 seated  Knee extension 4-/5 4-/5 4/5 4+/5  Ankle dorsiflexion 5/5 5/5    Ankle plantarflexion weak weak    Ankle inversion      Ankle eversion       (Blank rows = not tested)   FUNCTIONAL TESTS:  Pt is very slow with sit to stand transfers.  Pt able to perform a sit to stand transfer without using Ue's though very slow and challenging. 5x STS test:  6 sec with Ue's  (Eval)   26.6 sec with Ue's  (01/19/24)  GAIT: Comments: very slow with rollator.    TREATMENT:  01/19/24:  Nustep lvl 3 bilat UE/LE's  x 6 mins See above for sit/stand transfers and 5x STS test.  PT assessed LE strength.  See above.   Sidestepping x 2 laps at rail with UE support Pt completed Modified Oswestry.  See above    [facility] Adult PT Treatment:                                                DATE: 01/17/24 Pt seen for aquatic therapy today.  Treatment took place in water 3.5-4.75 ft in depth at the [facility] pool. Temp of water was 91.  Pt entered/exited the pool via stairs with cl supervision and  hand rail.   *walking forward, back and side stepping in 3.6  ft with ue support of barbell  - squatted rest period *Ue support barbell: toe raises; heel raises; high knee marching:  hip abdct 3 x5; hip flex/ext x10 (good balance challenge)  -squatted rest period *step ups leading R/L x 10 ue support bilaterally then unilaterally as tolerated-> 4-5 reps leading R/L unsupported (good challenge) *stair tapping bottom step R/L unsupported after a few trails x 10 L/R. Advanced to 2nd step tap, requires ue support to get started.  Able to complete x 5 unsteady but no LOB. Good challenge. *cycling on straddled noodle    Pt requires the buoyancy and hydrostatic pressure of water for support, and to offload joints by unweighting joint load by at least 50 % in navel deep water and by at least 75-80% in chest to neck deep water.  Viscosity of the water is needed for resistance of strengthening. Water current perturbations provides challenge to standing balance requiring increased core activation.   01/12/24 Nustep lvl 3 bilat UE/LE's  x 6 mins LAQ  R: 0# x10, 1# x10, L: 2# x10, 1# x10 Seated HS curl R:  RTB x10, YTB x10, L:  RTGB 2x10 Seated Hip abduction with GTB 3x10 Marching with UE support on back of chair x10 each Sidestepping with TrA with UE support on rail x 2 laps Standing heel raises with TrA  2x10  See below for pt education     [facility] Adult PT Treatment:                                                DATE: 01/10/24 Pt seen for aquatic therapy today.  Treatment took place in water 3.5-4.75 ft in depth at the [facility] pool. Temp of water was 91.  Pt entered/exited the pool via stairs with cl supervision and  hand rail.   -Addressed goals   *walking forward, back and side stepping in 3.6  ft with ue support of barbell  - squatted rest period *Ue support on wall->barbell: toe raises; heel raises; high knee marching:  hip abdct ; hip flex/ext x10 (good balance challenge)  -squatted rest period *step ups leading R/L x 6 ue support bilaterally then unilaterally as tolerated.  -seated rest period on 3rd step *cycling on straddled noodle    Pt requires the buoyancy and hydrostatic pressure of water for support, and to offload joints by unweighting joint load by at least 50 % in navel deep water and by at least 75-80% in  chest to neck deep water.  Viscosity of the water is needed for resistance of strengthening. Water current perturbations provides challenge to standing balance requiring increased core activation.    12/31  Reviewed pt presentation, pain levels, and HEP compliance.  Nustep lvl 3 bilat UE/LE's  x 5 mins LAQ  2# x15 on R, approx 8 on L Seated Hip abduction with GTB approx 15 Sit to stands from table minimally elevated x5,3 reps without UE support Marching with UE support on back of chair x10 each   [facility] Adult PT Treatment:                                                DATE: 12/14/23 Pt seen for aquatic therapy today.  Treatment took place in water 3.5-4.75 ft in depth at the [facility] pool. Temp of water was 91.  Pt entered/exited the pool via stairs with cl supervision and  hand rail.   *walking forward, back and side stepping in 3.6  ft with ue support of barbell  - squatted rest period *Ue support on wall: toe raises; heel raises;  high knee marching  hip abdct ; hip ext x10  -squatted rest period *step ups leading R/L x 6 ue support bilaterally then unilaterally as tolerated.  -seated rest period on 3rd step *cycling; hip add/abd; flutter kicking on 3rd water step  -seated rest period *forward and backward marching ue support barbell port barbell. Pt reports reduced LB discomfort with increased submersion.   Pt requires the buoyancy and hydrostatic pressure of water for support, and to offload joints by unweighting joint load by at least 50 % in navel deep water and by at least 75-80% in chest to neck deep water.  Viscosity of the water is needed for resistance of strengthening. Water current perturbations provides challenge to standing balance requiring increased core activation.  Reviewed response to prior treatment, pain level, and current HEP.  Pt is performing sidestepping, LAQ with 1# (sometimes without wt), standing heel/toe raises, sit to stands, seated/supine clams.  LAQ 1# 2x10 Seated hip abd with RTB x 8 reps, GTB  x10, x15 reps Sit to stands with chair in front of her with hands on her LE's Supine bridge x 10 reps S/L clams x 10 bilat   PATIENT EDUCATION:  Education details: dx, exercise form, rationale of interventions, POC, and relevant anatomy.  PT answered pt's questions.   Person educated: Patient Education method: Explanation Education comprehension: verbalized understanding  HOME EXERCISE PROGRAM: Pt has a HEP from prior PT.     ASSESSMENT:  CLINICAL IMPRESSION: Treatment limited today due to pt arriving late.  Pt continues to be slow with mobility and has limited tolerance to activity though is making some progress.  Pt reports improved dressing and bathing.  She reports moderate difficulty with daily transfers.  Pt demonstrates improved strength in bilat LE's as evidenced by HHD testing.  She demonstrates much improved performance of sit to stands as evidenced by her 5x STST time improving  from 1 min 6 sec initially to 26.6 sec currently.  Pt performed the test using Ue's.  She was able to perform 1 rep without using Ue's after the test though was very challenging.  Pt has met 3/5 STG's and partially met 1/5 STG's and 1/5 LTG's.  Pt should benefit from continued PT with land  and aquatic therapy to address impairments and goals, reduce pain, and to maximize functional mobility.       OBJECTIVE IMPAIRMENTS: Abnormal gait, decreased activity tolerance, decreased balance, decreased endurance, decreased mobility, difficulty walking, decreased strength, and pain.   ACTIVITY LIMITATIONS: carrying, lifting, standing, sleeping, stairs, transfers, dressing, and locomotion level  PARTICIPATION LIMITATIONS: cleaning, shopping, and community activity  PERSONAL FACTORS: Time since onset of injury/illness/exacerbation and 3+ comorbidities: Pacemaker, osteopenia/osteoporosis, knee pain, arthritis, DM type 2, peripheral neuropathy are also affecting patient's functional outcome.   REHAB POTENTIAL: Good  CLINICAL DECISION MAKING: Stable/uncomplicated  EVALUATION COMPLEXITY: Low   GOALS:  SHORT TERM GOALS: Target date: 12/19/2023   Pt will tolerate aquatic therapy without adverse effects for improved strength, mobility, pain, function, and tolerance to activity.  Baseline: Goal status: Met 01/10/24  2.  Pt will perform 5x STS test in no > 50 sec for improved functional LE strength and performance of transfers.  Baseline:  Goal status: GOAL MET  1/15  3.  Pt will report at least a 25% improvement overall in back and bilat knee pain.  Baseline:  Goal status: Partially met 01/10/24  4.  Pt will report she is able to perform her daily transfers with no > than minimal difficulty.   Baseline:  Goal status: In progress 01/19/24  currently moderate difficulty Target date:  12/26/2023    5.  Pt will report at least a 50% improvement in dressing.  Baseline:  Goal status: Met  01/10/24    LONG TERM GOALS: Target date: 02/16/2024   Pt will be able to ambulate extended community distance including performing grocery shopping without significant pain and difficulty.   Baseline:  Goal status: ONGOING  2.  Pt will report she is able to perform her daily transfers without difficulty.  Baseline:  Goal status: ONGOING  3.  Pt will perform 5x STS test in no > 20 sec for improved functional LE strength and performance of transfers.  Baseline:  Goal status: PROGRESSING   4.  Pt will demo improved bilat hip flexion strength by 5 lbs and hip abd by 10 lbs and knee extension to 5/5 MMT for improved performance of and tolerance with functional mobility.  Baseline:  Goal status:  33% MET  5.  Pt will be independent with pool program and land based HEP for improved pain, strength, and functional mobility.  Baseline:  Goal status: ONGOING   PLAN:  PT FREQUENCY: 2x/week  PT DURATION: 4 weeks  PLANNED INTERVENTIONS: [id number]- PT Re-evaluation, 97750- Physical Performance Testing, 97110-Therapeutic exercises, 97530- Therapeutic activity, [id number]- Neuromuscular re-education, 97535- Self Care, [id number]- Manual therapy, [phone]- Gait training, [id number]- Aquatic Therapy, Patient/Family education, Stair training, Taping, Scar mobilization, DME instructions, Cryotherapy, and Moist heat.  PLAN FOR NEXT SESSION:  Assess tenderness to palpation and soft tissue tightness next visit.  Cont with aquatic therapy and land based therapy.   CARDIAC PACEMAKER--NO E-STIM   Leigh Minerva III PT, DPT 01/20/24 12:01 PM  [facility] [address] [place], [place], [address] Phone: [phone]   Fax:  [phone]        "

## 2024-01-24 ENCOUNTER — Encounter (HOSPITAL_BASED_OUTPATIENT_CLINIC_OR_DEPARTMENT_OTHER): Payer: Self-pay

## 2024-01-24 ENCOUNTER — Ambulatory Visit (HOSPITAL_BASED_OUTPATIENT_CLINIC_OR_DEPARTMENT_OTHER): Admitting: Physical Therapy

## 2024-01-26 ENCOUNTER — Encounter (HOSPITAL_BASED_OUTPATIENT_CLINIC_OR_DEPARTMENT_OTHER): Payer: Self-pay | Admitting: Physical Therapy

## 2024-01-26 ENCOUNTER — Ambulatory Visit (HOSPITAL_BASED_OUTPATIENT_CLINIC_OR_DEPARTMENT_OTHER): Admitting: Physical Therapy

## 2024-01-26 DIAGNOSIS — M5459 Other low back pain: Secondary | ICD-10-CM

## 2024-01-26 DIAGNOSIS — M6281 Muscle weakness (generalized): Secondary | ICD-10-CM

## 2024-01-26 DIAGNOSIS — R262 Difficulty in walking, not elsewhere classified: Secondary | ICD-10-CM

## 2024-01-26 DIAGNOSIS — M25561 Pain in right knee: Secondary | ICD-10-CM

## 2024-01-26 NOTE — Therapy (Signed)
 " OUTPATIENT PHYSICAL THERAPY THORACOLUMBAR TREATMENT        Patient Name: Amy Bartlett MRN: [id number] DOB:1950/02/28, 74 y.o., female Today's Date: 01/27/2024  END OF SESSION:  PT End of Session - 01/26/24 1126     Visit Number 11    Number of Visits 18    Date for Recertification  02/16/24    Authorization Type [employer] MCR    PT Start Time 1113    PT Stop Time 1151    PT Time Calculation (min) 38 min    Activity Tolerance Patient tolerated treatment well    Behavior During Therapy WFL for tasks assessed/performed              Past Medical History:  Diagnosis Date   Arthritis    Chicken pox    Diabetes mellitus (HCC)    Hyperlipidemia    Hypertension    Hyponatremia - HCTZ 07/15/2020   Osteopenia    Past Surgical History:  Procedure Laterality Date   CARDIAC PACEMAKER PLACEMENT     CESAREAN SECTION  1974, 1977, 1979   REDUCTION MAMMAPLASTY Bilateral 2009   TUBAL LIGATION     Patient Active Problem List   Diagnosis Date Noted   Bilateral primary osteoarthritis of hip 12/08/2023   Gastroesophageal reflux disease 08/31/2023   Peripheral vascular disease with claudication 01/06/2022   Spinal stenosis of lumbar region with neurogenic claudication 01/06/2022   Class 2 severe obesity due to excess calories with serious comorbidity and body mass index (BMI) of 35.0 to 35.9 in adult 03/10/2021   Diabetic peripheral neuropathy associated with type 2 diabetes mellitus (HCC) 12/18/2020   Hyponatremia - HCTZ 07/15/2020   Spondylosis of lumbar region without myelopathy or radiculopathy 07/14/2020   Cardiac pacemaker in situ 03/19/2019   CKD (chronic kidney disease) stage 3, GFR 30-59 ml/min (HCC) 10/04/2016   Obesity (BMI 30-39.9) 01/12/2016   NSVT (nonsustained ventricular tachycardia) 12/05/2015   Primary localized osteoarthrosis of left lower leg 07/16/2014   History of complete heart block 10/04/2012   Combined hyperlipidemia associated with type 2  diabetes mellitus (HCC) 11/04/2011   Type 2 diabetes mellitus with stage 3b chronic kidney disease, without long-term current use of insulin (HCC) 11/04/2011   Age-related osteoporosis without fracture 06/01/2011   Vitamin D  deficiency 04/16/2011   Hypertension associated with diabetes (HCC) 01/11/2011     REFERRING PROVIDER: Laqueta Ozell BIRCH, MD   REFERRING DIAG: M54.50 (ICD-10-CM) - Low back pain, unspecified   Rationale for Evaluation and Treatment: Rehabilitation  THERAPY DIAG:  Other low back pain  Pain in both knees, unspecified chronicity  Muscle weakness (generalized)  Difficulty in walking, not elsewhere classified  ONSET DATE:  chronic pain / Surgery May 2025  SUBJECTIVE:  SUBJECTIVE STATEMENT: Pt continues to have L hip/groin pain.  Her R knee pain wakes her up.  Pt denies any adverse effects after prior treatment.      PERTINENT HISTORY:  L4-5, L5-S1 TLIF in May 2025 CARDIAC PACEMAKER--NO E-STIM Bilat knee pain--Pt states she was informed she has arthritis DM type 2, peripheral neuropathy HTN Osteopenia/osteoporosis CKD  PAIN:  NPRS:  Lumbar:  0/10 current, 5-6/10 worst.  L hip/groin:  3-4/10 current, 7-8/10 worst Location:  see above   PRECAUTIONS: ICD/Pacemaker and Other: lumbar fusion and osteopenia/osteoporosis.    WEIGHT BEARING RESTRICTIONS: No Wb'ing restrictions.  Pt thinks she has a lifting restriction, but not sure about how much.    FALLS:  Has patient fallen in last 6 months? No  LIVING ENVIRONMENT: Lives with: lives with their spouse Lives in: 1 story home Stairs: 4 steps with 1 rail Has following equipment at home: cane, rollator, shower chair, FWW  OCCUPATION: Retired  PLOF: Independent.  Pt ambulated without an AD.   PATIENT GOALS: strengthen  LE's, improve mobility   OBJECTIVE:  Note: Objective measures were completed at Evaluation unless otherwise noted.  DIAGNOSTIC FINDINGS:  Pt is post op.  MD note indicated lumbar spine xrays today are satisfactory.     Pt has received diagnostic testing for hips and knees though PT unable to view them.  PATIENT SURVEYS:  Modified Oswestry Disability Index:  23 = 46%  (Eval)          26 = 52%   (01/19/24)  COGNITION: Overall cognitive status: Within functional limits for tasks assessed     SENSATION: L2-S2:  2+ to LT bilat   OBSERVATION: Incision has healed well.  Incision closed and dry and has no redness.    PALPATION: Assess next visit   LOWER EXTREMITY MMT:    MMT Right eval Left eval Right 1/15 Left 1/15  Hip flexion 15.4 13.3 25.8 21.2  Hip extension      Hip abduction 11.4 15.0 15.4 18.6  Hip adduction      Hip internal rotation      Hip external rotation      Knee flexion Tol min resistance 5/5 seated Tol min resistance 5/5 seated  Knee extension 4-/5 4-/5 4/5 4+/5  Ankle dorsiflexion 5/5 5/5    Ankle plantarflexion weak weak    Ankle inversion      Ankle eversion       (Blank rows = not tested)   FUNCTIONAL TESTS:  Pt is very slow with sit to stand transfers.  Pt able to perform a sit to stand transfer without using Ue's though very slow and challenging. 5x STS test:  6 sec with Ue's  (Eval)   26.6 sec with Ue's  (01/19/24)  GAIT: Comments: very slow with rollator.    TREATMENT:  01/26/24 Nustep lvl 3-4 bilat UE/LE's  x 6 mins LAQ 1# on R, 2# on L x 20 each Seated Hip abd with GTB Seated HS curls with RTB 2x10 bilat Sidestepping x 2 laps with UE support on rail Standing marching x 10 reps Standing heel raises 2x10 with TrA Step ups with TrA on 4 inch step with UE support   01/19/24:  Nustep lvl 3 bilat UE/LE's   x 6 mins See above for sit/stand transfers and 5x STS test.  PT assessed LE strength.  See above.   Sidestepping x 2 laps at rail with UE support Pt completed Modified Oswestry.  See above    [facility] Adult PT Treatment:                                                DATE: 01/17/24 Pt seen for aquatic therapy today.  Treatment took place in water 3.5-4.75 ft in depth at the [facility] pool. Temp of water was 91.  Pt entered/exited the pool via stairs with cl supervision and  hand rail.   *walking forward, back and side stepping in 3.6  ft with ue support of barbell  - squatted rest period *Ue support barbell: toe raises; heel raises; high knee marching:  hip abdct 3 x5; hip flex/ext x10 (good balance challenge)  -squatted rest period *step ups leading R/L x 10 ue support bilaterally then unilaterally as tolerated-> 4-5 reps leading R/L unsupported (good challenge) *stair tapping bottom step R/L unsupported after a few trails x 10 L/R. Advanced to 2nd step tap, requires ue support to get started.  Able to complete x 5 unsteady but no LOB. Good challenge. *cycling on straddled noodle    Pt requires the buoyancy and hydrostatic pressure of water for support, and to offload joints by unweighting joint load by at least 50 % in navel deep water and by at least 75-80% in chest to neck deep water.  Viscosity of the water is needed for resistance of strengthening. Water current perturbations provides challenge to standing balance requiring increased core activation.   01/12/24 Nustep lvl 3 bilat UE/LE's  x 6 mins LAQ  R: 0# x10, 1# x10, L: 2# x10, 1# x10 Seated HS curl R:  RTB x10, YTB x10, L:  RTGB 2x10 Seated Hip abduction with GTB 3x10 Marching with UE support on back of chair x10 each Sidestepping with TrA with UE support on rail x 2 laps Standing heel raises with TrA 2x10  See below for pt education     [facility] Adult PT Treatment:                                                DATE:  01/10/24 Pt seen for aquatic therapy today.  Treatment took place in water 3.5-4.75 ft in depth at the [facility] pool. Temp of water was 91.  Pt entered/exited the pool via stairs with cl supervision and  hand rail.   -Addressed goals   *walking forward, back and side stepping in 3.6  ft with ue support of barbell  - squatted rest period *Ue support on wall->barbell: toe raises; heel raises; high knee marching:  hip abdct ; hip flex/ext x10 (good balance  challenge)  -squatted rest period *step ups leading R/L x 6 ue support bilaterally then unilaterally as tolerated.  -seated rest period on 3rd step *cycling on straddled noodle    Pt requires the buoyancy and hydrostatic pressure of water for support, and to offload joints by unweighting joint load by at least 50 % in navel deep water and by at least 75-80% in chest to neck deep water.  Viscosity of the water is needed for resistance of strengthening. Water current perturbations provides challenge to standing balance requiring increased core activation.    12/31  Reviewed pt presentation, pain levels, and HEP compliance.  Nustep lvl 3 bilat UE/LE's  x 5 mins LAQ  2# x15 on R, approx 8 on L Seated Hip abduction with GTB approx 15 Sit to stands from table minimally elevated x5,3 reps without UE support Marching with UE support on back of chair x10 each   [facility] Adult PT Treatment:                                                DATE: 12/14/23 Pt seen for aquatic therapy today.  Treatment took place in water 3.5-4.75 ft in depth at the [facility] pool. Temp of water was 91.  Pt entered/exited the pool via stairs with cl supervision and  hand rail.   *walking forward, back and side stepping in 3.6  ft with ue support of barbell  - squatted rest period *Ue support on wall: toe raises; heel raises; high knee marching  hip abdct ; hip ext x10  -squatted rest period *step ups leading R/L x 6 ue support bilaterally then  unilaterally as tolerated.  -seated rest period on 3rd step *cycling; hip add/abd; flutter kicking on 3rd water step  -seated rest period *forward and backward marching ue support barbell port barbell. Pt reports reduced LB discomfort with increased submersion.   Pt requires the buoyancy and hydrostatic pressure of water for support, and to offload joints by unweighting joint load by at least 50 % in navel deep water and by at least 75-80% in chest to neck deep water.  Viscosity of the water is needed for resistance of strengthening. Water current perturbations provides challenge to standing balance requiring increased core activation.  Reviewed response to prior treatment, pain level, and current HEP.  Pt is performing sidestepping, LAQ with 1# (sometimes without wt), standing heel/toe raises, sit to stands, seated/supine clams.  LAQ 1# 2x10 Seated hip abd with RTB x 8 reps, GTB  x10, x15 reps Sit to stands with chair in front of her with hands on her LE's Supine bridge x 10 reps S/L clams x 10 bilat   PATIENT EDUCATION:  Education details: dx, exercise form, rationale of interventions, POC, and relevant anatomy.  PT answered pt's questions.   Person educated: Patient Education method: Explanation Education comprehension: verbalized understanding  HOME EXERCISE PROGRAM: Pt has a HEP from prior PT.     ASSESSMENT:  CLINICAL IMPRESSION: PT performed exercises to improve LE, core, and functional strength, pain, and functional mobility.  Pt gave good effort with all exercises and performed exercise well with cuing for correct form.  She has weakness in LE's.  PT added step ups on a low step with TrA to improve functional, LE, and core strength and for improved performance of stairs.  Pt continues to be  slow with mobility though seems to be improving with tolerance for exercises on land.  She responded well to treatment having no c/o's after treatment.  Pt should benefit from continued PT  with land and aquatic therapy to address impairments and goals, reduce pain, and to maximize functional mobility.       OBJECTIVE IMPAIRMENTS: Abnormal gait, decreased activity tolerance, decreased balance, decreased endurance, decreased mobility, difficulty walking, decreased strength, and pain.   ACTIVITY LIMITATIONS: carrying, lifting, standing, sleeping, stairs, transfers, dressing, and locomotion level  PARTICIPATION LIMITATIONS: cleaning, shopping, and community activity  PERSONAL FACTORS: Time since onset of injury/illness/exacerbation and 3+ comorbidities: Pacemaker, osteopenia/osteoporosis, knee pain, arthritis, DM type 2, peripheral neuropathy are also affecting patient's functional outcome.   REHAB POTENTIAL: Good  CLINICAL DECISION MAKING: Stable/uncomplicated  EVALUATION COMPLEXITY: Low   GOALS:  SHORT TERM GOALS: Target date: 12/19/2023   Pt will tolerate aquatic therapy without adverse effects for improved strength, mobility, pain, function, and tolerance to activity.  Baseline: Goal status: Met 01/10/24  2.  Pt will perform 5x STS test in no > 50 sec for improved functional LE strength and performance of transfers.  Baseline:  Goal status: GOAL MET  1/15  3.  Pt will report at least a 25% improvement overall in back and bilat knee pain.  Baseline:  Goal status: Partially met 01/10/24  4.  Pt will report she is able to perform her daily transfers with no > than minimal difficulty.   Baseline:  Goal status: In progress 01/19/24  currently moderate difficulty Target date:  12/26/2023    5.  Pt will report at least a 50% improvement in dressing.  Baseline:  Goal status: Met 01/10/24    LONG TERM GOALS: Target date: 02/16/2024   Pt will be able to ambulate extended community distance including performing grocery shopping without significant pain and difficulty.   Baseline:  Goal status: ONGOING  2.  Pt will report she is able to perform her daily transfers  without difficulty.  Baseline:  Goal status: ONGOING  3.  Pt will perform 5x STS test in no > 20 sec for improved functional LE strength and performance of transfers.  Baseline:  Goal status: PROGRESSING   4.  Pt will demo improved bilat hip flexion strength by 5 lbs and hip abd by 10 lbs and knee extension to 5/5 MMT for improved performance of and tolerance with functional mobility.  Baseline:  Goal status:  33% MET  5.  Pt will be independent with pool program and land based HEP for improved pain, strength, and functional mobility.  Baseline:  Goal status: ONGOING   PLAN:  PT FREQUENCY: 2x/week  PT DURATION: 4 weeks  PLANNED INTERVENTIONS: [id number]- PT Re-evaluation, 97750- Physical Performance Testing, 97110-Therapeutic exercises, 97530- Therapeutic activity, [id number]- Neuromuscular re-education, 97535- Self Care, [id number]- Manual therapy, [phone]- Gait training, [phone]- Aquatic Therapy, Patient/Family education, Stair training, Taping, Scar mobilization, DME instructions, Cryotherapy, and Moist heat.  PLAN FOR NEXT SESSION:  Assess tenderness to palpation and soft tissue tightness next visit.  Cont with aquatic therapy and land based therapy.   CARDIAC PACEMAKER--NO E-STIM   Leigh Minerva III PT, DPT 01/28/24 4:50 PM   [facility] [address] [place], [place], [address] Phone: [phone]   Fax:  [phone]        "

## 2024-02-02 ENCOUNTER — Encounter (HOSPITAL_BASED_OUTPATIENT_CLINIC_OR_DEPARTMENT_OTHER): Payer: Self-pay | Admitting: Physical Therapy

## 2024-02-02 ENCOUNTER — Ambulatory Visit (HOSPITAL_BASED_OUTPATIENT_CLINIC_OR_DEPARTMENT_OTHER): Admitting: Physical Therapy

## 2024-02-02 DIAGNOSIS — R262 Difficulty in walking, not elsewhere classified: Secondary | ICD-10-CM

## 2024-02-02 DIAGNOSIS — M6281 Muscle weakness (generalized): Secondary | ICD-10-CM

## 2024-02-02 DIAGNOSIS — M25561 Pain in right knee: Secondary | ICD-10-CM

## 2024-02-02 DIAGNOSIS — M5459 Other low back pain: Secondary | ICD-10-CM | POA: Diagnosis not present

## 2024-02-02 NOTE — Therapy (Addendum)
 " OUTPATIENT PHYSICAL THERAPY THORACOLUMBAR TREATMENT        Patient Name: Amy Bartlett MRN: [id number] DOB:[birth year], 74 y.o., female Today's Date: 02/02/2024  END OF SESSION:  PT End of Session - 02/02/24 1036     Visit Number 12    Number of Visits 18    Date for Recertification  02/16/24    Authorization Type [employer] MCR    Authorization Time Period 01/30/24-03/24/24    Authorization - Visit Number 1    Authorization - Number of Visits 8    PT Start Time 1025   pt arrived late to pool area   PT Stop Time 1103    PT Time Calculation (min) 38 min    Activity Tolerance Patient tolerated treatment well    Behavior During Therapy [facility] for tasks assessed/performed              Past Medical History:  Diagnosis Date   Arthritis    Chicken pox    Diabetes mellitus (HCC)    Hyperlipidemia    Hypertension    Hyponatremia - HCTZ 07/15/2020   Osteopenia    Past Surgical History:  Procedure Laterality Date   CARDIAC PACEMAKER PLACEMENT     CESAREAN SECTION  1974, 1977, 1979   REDUCTION MAMMAPLASTY Bilateral 2009   TUBAL LIGATION     Patient Active Problem List   Diagnosis Date Noted   Bilateral primary osteoarthritis of hip 12/08/2023   Gastroesophageal reflux disease 08/31/2023   Peripheral vascular disease with claudication 01/06/2022   Spinal stenosis of lumbar region with neurogenic claudication 01/06/2022   Class 2 severe obesity due to excess calories with serious comorbidity and body mass index (BMI) of 35.0 to 35.9 in adult 03/10/2021   Diabetic peripheral neuropathy associated with type 2 diabetes mellitus (HCC) 12/18/2020   Hyponatremia - HCTZ 07/15/2020   Spondylosis of lumbar region without myelopathy or radiculopathy 07/14/2020   Cardiac pacemaker in situ 03/19/2019   CKD (chronic kidney disease) stage 3, GFR 30-59 ml/min (HCC) 10/04/2016   Obesity (BMI 30-39.9) 01/12/2016   NSVT (nonsustained ventricular tachycardia) 12/05/2015   Primary  localized osteoarthrosis of left lower leg 07/16/2014   History of complete heart block 10/04/2012   Combined hyperlipidemia associated with type 2 diabetes mellitus (HCC) 11/04/2011   Type 2 diabetes mellitus with stage 3b chronic kidney disease, without long-term current use of insulin (HCC) 11/04/2011   Age-related osteoporosis without fracture 06/01/2011   Vitamin D  deficiency 04/16/2011   Hypertension associated with diabetes (HCC) 01/11/2011     REFERRING PROVIDER: Laqueta Ozell BIRCH, MD   REFERRING DIAG: M54.50 (ICD-10-CM) - Low back pain, unspecified   Rationale for Evaluation and Treatment: Rehabilitation  THERAPY DIAG:  Other low back pain  Pain in both knees, unspecified chronicity  Muscle weakness (generalized)  Difficulty in walking, not elsewhere classified  ONSET DATE:  chronic pain / Surgery May 2025  SUBJECTIVE:  SUBJECTIVE STATEMENT: Pt reports that she noticed she can lift her LEs a little higher (from seated position).  Back doesn't hurt much unless she is standing for too long. Pt reports she was sore from last visit (land), it takes me two days to recover.    Pool Access:  member of aquatic center.  She has previously completed water aerobics there and hopes to return to this someday.     PERTINENT HISTORY:  L4-5, L5-S1 TLIF in May 2025 CARDIAC PACEMAKER--NO E-STIM Bilat knee pain--Pt states she was informed she has arthritis DM type 2, peripheral neuropathy HTN Osteopenia/osteoporosis CKD  PAIN:  NPRS:   L hip/groin: 5/10; Lt knee 5/10 Location:  see above    PRECAUTIONS: ICD/Pacemaker and Other: lumbar fusion and osteopenia/osteoporosis.    WEIGHT BEARING RESTRICTIONS: No Wb'ing restrictions.  Pt thinks she has a lifting restriction, but not sure about how  much.    FALLS:  Has patient fallen in last 6 months? No  LIVING ENVIRONMENT: Lives with: lives with their spouse Lives in: 1 story home Stairs: 4 steps with 1 rail Has following equipment at home: cane, rollator, shower chair, FWW  OCCUPATION: Retired  PLOF: Independent.  Pt ambulated without an AD.   PATIENT GOALS: strengthen LE's, improve mobility   OBJECTIVE:  Note: Objective measures were completed at Evaluation unless otherwise noted.  DIAGNOSTIC FINDINGS:  Pt is post op.  MD note indicated lumbar spine xrays today are satisfactory.     Pt has received diagnostic testing for hips and knees though PT unable to view them.  PATIENT SURVEYS:  Modified Oswestry Disability Index:  23 = 46%  (Eval)          26 = 52%   (01/19/24)  COGNITION: Overall cognitive status: Within functional limits for tasks assessed     SENSATION: L2-S2:  2+ to LT bilat   OBSERVATION: Incision has healed well.  Incision closed and dry and has no redness.    PALPATION:    LOWER EXTREMITY MMT:    MMT Right eval Left eval Right 1/15 Left 1/15  Hip flexion 15.4 13.3 25.8 21.2  Hip extension      Hip abduction 11.4 15.0 15.4 18.6  Hip adduction      Hip internal rotation      Hip external rotation      Knee flexion Tol min resistance 5/5 seated Tol min resistance 5/5 seated  Knee extension 4-/5 4-/5 4/5 4+/5  Ankle dorsiflexion 5/5 5/5    Ankle plantarflexion weak weak    Ankle inversion      Ankle eversion       (Blank rows = not tested)   FUNCTIONAL TESTS:  Pt is very slow with sit to stand transfers.  Pt able to perform a sit to stand transfer without using Ue's though very slow and challenging. 5x STS test:  6 sec with Ue's  (Eval)   26.6 sec with Ue's  (01/19/24)  GAIT: Comments: very slow with rollator.    TREATMENT:  [facility] Adult PT  Treatment:                                                DATE: 02/02/24 Pt seen for aquatic therapy today.  Treatment took place in water 3.5-4.75 ft in depth at the [facility] pool. Temp of water was 91.  Pt entered/exited the pool via stairs with cl supervision and  hand rail.   *UE support of barbell:walking forward, backward, and side stepping with cues for even step length  *UE support barbell: heel raises x 10; high knee marching:  hip abdct 3 x5;  * STS at bench in water with feet on blue step x 2 with UE on barbell, x 8 without UE support. Cues for forward arm reach, neutral head, controlled descent *Seated on bench in water:  alternating LAQ with DF, cycling (bothered Lt ant hip), hip abdct/add    01/26/24 Nustep lvl 3-4 bilat UE/LE's  x 6 mins LAQ 1# on R, 2# on L x 20 each Seated Hip abd with GTB Seated HS curls with RTB 2x10 bilat Sidestepping x 2 laps with UE support on rail Standing marching x 10 reps Standing heel raises 2x10 with TrA Step ups with TrA on 4 inch step with UE support   01/19/24:  Nustep lvl 3 bilat UE/LE's  x 6 mins See above for sit/stand transfers and 5x STS test.  PT assessed LE strength.  See above.   Sidestepping x 2 laps at rail with UE support Pt completed Modified Oswestry.  See above    [facility] Adult PT Treatment:                                                DATE: 01/17/24 Pt seen for aquatic therapy today.  Treatment took place in water 3.5-4.75 ft in depth at the [facility] pool. Temp of water was 91.  Pt entered/exited the pool via stairs with cl supervision and  hand rail.   *walking forward, back and side stepping in 3.6  ft with ue support of barbell  - squatted rest period *Ue support barbell: toe raises; heel raises; high knee marching:  hip abdct 3 x5; hip flex/ext x10 (good balance challenge)  -squatted rest period *step ups leading R/L x 10 ue support bilaterally then unilaterally as tolerated-> 4-5 reps  leading R/L unsupported (good challenge) *stair tapping bottom step R/L unsupported after a few trails x 10 L/R. Advanced to 2nd step tap, requires ue support to get started.  Able to complete x 5 unsteady but no LOB. Good challenge. *cycling on straddled noodle    Pt requires the buoyancy and hydrostatic pressure of water for support, and to offload joints by unweighting joint load by at least 50 % in navel deep water and by at least 75-80% in chest to neck deep water.  Viscosity of the water is needed for resistance of strengthening. Water current perturbations provides challenge to standing balance requiring increased core activation.   01/12/24 Nustep lvl 3 bilat UE/LE's  x 6 mins LAQ  R: 0# x10, 1# x10, L: 2# x10, 1# x10 Seated HS curl R:  RTB x10, YTB x10, L:  RTGB 2x10 Seated Hip abduction with  GTB 3x10 Marching with UE support on back of chair x10 each Sidestepping with TrA with UE support on rail x 2 laps Standing heel raises with TrA 2x10  See below for pt education     [facility] Adult PT Treatment:                                                DATE: 01/10/24 Pt seen for aquatic therapy today.  Treatment took place in water 3.5-4.75 ft in depth at the [facility] pool. Temp of water was 91.  Pt entered/exited the pool via stairs with cl supervision and  hand rail.   -Addressed goals   *walking forward, back and side stepping in 3.6  ft with ue support of barbell  - squatted rest period *Ue support on wall->barbell: toe raises; heel raises; high knee marching:  hip abdct ; hip flex/ext x10 (good balance challenge)  -squatted rest period *step ups leading R/L x 6 ue support bilaterally then unilaterally as tolerated.  -seated rest period on 3rd step *cycling on straddled noodle    Pt requires the buoyancy and hydrostatic pressure of water for support, and to offload joints by unweighting joint load by at least 50 % in navel deep water and by at least 75-80% in chest to  neck deep water.  Viscosity of the water is needed for resistance of strengthening. Water current perturbations provides challenge to standing balance requiring increased core activation.    12/31  Reviewed pt presentation, pain levels, and HEP compliance.  Nustep lvl 3 bilat UE/LE's  x 5 mins LAQ  2# x15 on R, approx 8 on L Seated Hip abduction with GTB approx 15 Sit to stands from table minimally elevated x5,3 reps without UE support Marching with UE support on back of chair x10 each   [facility] Adult PT Treatment:                                                DATE: 12/14/23 Pt seen for aquatic therapy today.  Treatment took place in water 3.5-4.75 ft in depth at the [facility] pool. Temp of water was 91.  Pt entered/exited the pool via stairs with cl supervision and  hand rail.   *walking forward, back and side stepping in 3.6  ft with ue support of barbell  - squatted rest period *Ue support on wall: toe raises; heel raises; high knee marching  hip abdct ; hip ext x10  -squatted rest period *step ups leading R/L x 6 ue support bilaterally then unilaterally as tolerated.  -seated rest period on 3rd step *cycling; hip add/abd; flutter kicking on 3rd water step  -seated rest period *forward and backward marching ue support barbell port barbell. Pt reports reduced LB discomfort with increased submersion.   Pt requires the buoyancy and hydrostatic pressure of water for support, and to offload joints by unweighting joint load by at least 50 % in navel deep water and by at least 75-80% in chest to neck deep water.  Viscosity of the water is needed for resistance of strengthening. Water current perturbations provides challenge to standing balance requiring increased core activation.  Reviewed response to prior treatment, pain level, and current HEP.  Pt is performing sidestepping, LAQ with 1# (sometimes without wt), standing heel/toe raises, sit to stands, seated/supine  clams.  LAQ 1# 2x10 Seated hip abd with RTB x 8 reps, GTB  x10, x15 reps Sit to stands with chair in front of her with hands on her LE's Supine bridge x 10 reps S/L clams x 10 bilat   PATIENT EDUCATION:  Education details: exercise form, rationale of interventions,   Person educated: Patient Education method: Explanation, demo required  Education comprehension: verbalized understanding,   HOME EXERCISE PROGRAM: Pt has a HEP from prior PT.     ASSESSMENT:  CLINICAL IMPRESSION: Pt's session shortened due to pt's late arrival to pool (increased time to change after checking in).  Pt reported slight reduction in pain during session.  Good form with STS at bench in water with cues; may benefit from work on this at land appt.  Pt should benefit from continued PT with land and aquatic therapy to address impairments and goals, reduce pain, and to maximize functional mobility. Pt is making gradual progress towards remaining goals.      OBJECTIVE IMPAIRMENTS: Abnormal gait, decreased activity tolerance, decreased balance, decreased endurance, decreased mobility, difficulty walking, decreased strength, and pain.   ACTIVITY LIMITATIONS: carrying, lifting, standing, sleeping, stairs, transfers, dressing, and locomotion level  PARTICIPATION LIMITATIONS: cleaning, shopping, and community activity  PERSONAL FACTORS: Time since onset of injury/illness/exacerbation and 3+ comorbidities: Pacemaker, osteopenia/osteoporosis, knee pain, arthritis, DM type 2, peripheral neuropathy are also affecting patient's functional outcome.   REHAB POTENTIAL: Good  CLINICAL DECISION MAKING: Stable/uncomplicated  EVALUATION COMPLEXITY: Low   GOALS:  SHORT TERM GOALS: Target date: 12/19/2023   Pt will tolerate aquatic therapy without adverse effects for improved strength, mobility, pain, function, and tolerance to activity.  Baseline: Goal status: Met 01/10/24  2.  Pt will perform 5x STS test in no > 50  sec for improved functional LE strength and performance of transfers.  Baseline:  Goal status: GOAL MET  1/15  3.  Pt will report at least a 25% improvement overall in back and bilat knee pain.  Baseline:  Goal status: Partially met 01/10/24  4.  Pt will report she is able to perform her daily transfers with no > than minimal difficulty.   Baseline:  Goal status: In progress 01/19/24  currently moderate difficulty Target date:  12/26/2023    5.  Pt will report at least a 50% improvement in dressing.  Baseline:  Goal status: Met 01/10/24    LONG TERM GOALS: Target date: 02/16/2024   Pt will be able to ambulate extended community distance including performing grocery shopping without significant pain and difficulty.   Baseline:  Goal status: ONGOING  2.  Pt will report she is able to perform her daily transfers without difficulty.  Baseline:  Goal status: ONGOING  3.  Pt will perform 5x STS test in no > 20 sec for improved functional LE strength and performance of transfers.  Baseline:  Goal status: PROGRESSING   4.  Pt will demo improved bilat hip flexion strength by 5 lbs and hip abd by 10 lbs and knee extension to 5/5 MMT for improved performance of and tolerance with functional mobility.  Baseline:  Goal status:  33% MET  5.  Pt will be independent with pool program and land based HEP for improved pain, strength, and functional mobility.  Baseline:  Goal status: ONGOING   PLAN:  PT FREQUENCY: 2x/week  PT DURATION: 4 weeks  PLANNED INTERVENTIONS: 02835-  PT Re-evaluation, 97750- Physical Performance Testing, 97110-Therapeutic exercises, 97530- Therapeutic activity, [id number]- Neuromuscular re-education, 97535- Self Care, [id number]- Manual therapy, [phone]- Gait training, [phone]- Aquatic Therapy, Patient/Family education, Stair training, Taping, Scar mobilization, DME instructions, Cryotherapy, and Moist heat.  PLAN FOR NEXT SESSION:  Assess tenderness to palpation and soft tissue  tightness next visit.  Cont with aquatic therapy and land based therapy.   CARDIAC PACEMAKER--NO E-STIM    Delon Aquas, PTA 02/02/24 12:31 PM [facility] [address] [place], [place], [address] Phone: [phone]   Fax:  [phone]  "

## 2024-02-08 ENCOUNTER — Encounter (HOSPITAL_BASED_OUTPATIENT_CLINIC_OR_DEPARTMENT_OTHER): Payer: Self-pay | Admitting: Physical Therapy

## 2024-02-08 ENCOUNTER — Ambulatory Visit (HOSPITAL_BASED_OUTPATIENT_CLINIC_OR_DEPARTMENT_OTHER): Admitting: Physical Therapy

## 2024-02-08 DIAGNOSIS — M6281 Muscle weakness (generalized): Secondary | ICD-10-CM

## 2024-02-08 DIAGNOSIS — M25561 Pain in right knee: Secondary | ICD-10-CM

## 2024-02-08 DIAGNOSIS — M5459 Other low back pain: Secondary | ICD-10-CM

## 2024-02-08 DIAGNOSIS — R262 Difficulty in walking, not elsewhere classified: Secondary | ICD-10-CM

## 2024-02-08 NOTE — Therapy (Signed)
 " OUTPATIENT PHYSICAL THERAPY THORACOLUMBAR TREATMENT        Patient Name: Amy Bartlett MRN: [id number] DOB:02/20/1950, 74 y.o., female Today's Date: 02/08/2024  END OF SESSION:  PT End of Session - 02/08/24 1612     Visit Number 13    Number of Visits 18    Date for Recertification  02/16/24    Authorization Type [employer] MCR    Authorization Time Period 01/30/24-03/24/24    Authorization - Number of Visits 8    PT Start Time 1607    PT Stop Time 1652    PT Time Calculation (min) 45 min    Activity Tolerance Patient tolerated treatment well    Behavior During Therapy [facility] for tasks assessed/performed              Past Medical History:  Diagnosis Date   Arthritis    Chicken pox    Diabetes mellitus (HCC)    Hyperlipidemia    Hypertension    Hyponatremia - HCTZ 07/15/2020   Osteopenia    Past Surgical History:  Procedure Laterality Date   CARDIAC PACEMAKER PLACEMENT     CESAREAN SECTION  1974, 1977, 1979   REDUCTION MAMMAPLASTY Bilateral 2009   TUBAL LIGATION     Patient Active Problem List   Diagnosis Date Noted   Bilateral primary osteoarthritis of hip 12/08/2023   Gastroesophageal reflux disease 08/31/2023   Peripheral vascular disease with claudication 01/06/2022   Spinal stenosis of lumbar region with neurogenic claudication 01/06/2022   Class 2 severe obesity due to excess calories with serious comorbidity and body mass index (BMI) of 35.0 to 35.9 in adult 03/10/2021   Diabetic peripheral neuropathy associated with type 2 diabetes mellitus (HCC) 12/18/2020   Hyponatremia - HCTZ 07/15/2020   Spondylosis of lumbar region without myelopathy or radiculopathy 07/14/2020   Cardiac pacemaker in situ 03/19/2019   CKD (chronic kidney disease) stage 3, GFR 30-59 ml/min (HCC) 10/04/2016   Obesity (BMI 30-39.9) 01/12/2016   NSVT (nonsustained ventricular tachycardia) 12/05/2015   Primary localized osteoarthrosis of left lower leg 07/16/2014   History of  complete heart block 10/04/2012   Combined hyperlipidemia associated with type 2 diabetes mellitus (HCC) 11/04/2011   Type 2 diabetes mellitus with stage 3b chronic kidney disease, without long-term current use of insulin (HCC) 11/04/2011   Age-related osteoporosis without fracture 06/01/2011   Vitamin D  deficiency 04/16/2011   Hypertension associated with diabetes (HCC) 01/11/2011     REFERRING PROVIDER: Laqueta Ozell BIRCH, MD   REFERRING DIAG: M54.50 (ICD-10-CM) - Low back pain, unspecified   Rationale for Evaluation and Treatment: Rehabilitation  THERAPY DIAG:  No diagnosis found.  ONSET DATE:  chronic pain / Surgery May 2025  SUBJECTIVE:  SUBJECTIVE STATEMENT: Pt denies any adverse effects after prior treatment.  Pt reports improved performance of standing up from toilet.  Pt reports she can lift her LEs a little higher (from seated position).  Her back is not hurting.  Pt reports she is able to don shoes and socks independently.   Pool Access:  member of aquatic center.  She has previously completed water aerobics there and hopes to return to this someday.     PERTINENT HISTORY:  L4-5, L5-S1 TLIF in May 2025 CARDIAC PACEMAKER--NO E-STIM Bilat knee pain--Pt states she was informed she has arthritis DM type 2, peripheral neuropathy HTN Osteopenia/osteoporosis CKD  PAIN:  NPRS: 5-6/10 bilat knee pain, 0/10 lumbar, 3/10 L hip/groin Location:  see above    PRECAUTIONS: ICD/Pacemaker and Other: lumbar fusion and osteopenia/osteoporosis.    WEIGHT BEARING RESTRICTIONS: No Wb'ing restrictions.  Pt thinks she has a lifting restriction, but not sure about how much.    FALLS:  Has patient fallen in last 6 months? No  LIVING ENVIRONMENT: Lives with: lives with their spouse Lives in: 1  story home Stairs: 4 steps with 1 rail Has following equipment at home: cane, rollator, shower chair, FWW  OCCUPATION: Retired  PLOF: Independent.  Pt ambulated without an AD.   PATIENT GOALS: strengthen LE's, improve mobility   OBJECTIVE:  Note: Objective measures were completed at Evaluation unless otherwise noted.  DIAGNOSTIC FINDINGS:  Pt is post op.  MD note indicated lumbar spine xrays today are satisfactory.     Pt has received diagnostic testing for hips and knees though PT unable to view them.  PATIENT SURVEYS:  Modified Oswestry Disability Index:  23 = 46%  (Eval)          26 = 52%   (01/19/24)  COGNITION: Overall cognitive status: Within functional limits for tasks assessed     SENSATION: L2-S2:  2+ to LT bilat   OBSERVATION: Incision has healed well.  Incision closed and dry and has no redness.    PALPATION:    LOWER EXTREMITY MMT:    MMT Right eval Left eval Right 1/15 Left 1/15  Hip flexion 15.4 13.3 25.8 21.2  Hip extension      Hip abduction 11.4 15.0 15.4 18.6  Hip adduction      Hip internal rotation      Hip external rotation      Knee flexion Tol min resistance 5/5 seated Tol min resistance 5/5 seated  Knee extension 4-/5 4-/5 4/5 4+/5  Ankle dorsiflexion 5/5 5/5    Ankle plantarflexion weak weak    Ankle inversion      Ankle eversion       (Blank rows = not tested)   FUNCTIONAL TESTS:  Pt is very slow with sit to stand transfers.  Pt able to perform a sit to stand transfer without using Ue's though very slow and challenging. 5x STS test:  6 sec with Ue's  (Eval)   26.6 sec with Ue's  (01/19/24)  GAIT: Comments: very slow with rollator.    TREATMENT:  02/08/24 Nustep lvl 4 bilat UE/LE's  x 6 mins LAQ 1# on R, 2# on L x 20 each Seated HS curls with RTB 2x10 bilat Sit to stands from chair x 5  reps Sidestepping x 2 laps without UE support on rail Marching with TrA x 10 reps Step ups with trA with 1 UE assist    [facility] Adult PT Treatment:                                                DATE: 02/02/24 Pt seen for aquatic therapy today.  Treatment took place in water 3.5-4.75 ft in depth at the [facility] pool. Temp of water was 91.  Pt entered/exited the pool via stairs with cl supervision and  hand rail.   *UE support of barbell:walking forward, backward, and side stepping with cues for even step length  *UE support barbell: heel raises x 10; high knee marching:  hip abdct 3 x5;  * STS at bench in water with feet on blue step x 2 with UE on barbell, x 8 without UE support. Cues for forward arm reach, neutral head, controlled descent *Seated on bench in water:  alternating LAQ with DF, cycling (bothered Lt ant hip), hip abdct/add    01/26/24 Nustep lvl 3-4 bilat UE/LE's  x 6 mins LAQ 1# on R, 2# on L x 20 each Seated Hip abd with GTB Seated HS curls with RTB 2x10 bilat Sidestepping x 2 laps with UE support on rail Standing marching x 10 reps Standing heel raises 2x10 with TrA Step ups with TrA on 4 inch step with UE support   01/19/24:  Nustep lvl 3 bilat UE/LE's  x 6 mins See above for sit/stand transfers and 5x STS test.  PT assessed LE strength.  See above.   Sidestepping x 2 laps at rail with UE support Pt completed Modified Oswestry.  See above    [facility] Adult PT Treatment:                                                DATE: 01/17/24 Pt seen for aquatic therapy today.  Treatment took place in water 3.5-4.75 ft in depth at the [facility] pool. Temp of water was 91.  Pt entered/exited the pool via stairs with cl supervision and  hand rail.   *walking forward, back and side stepping in 3.6  ft with ue support of barbell  - squatted rest period *Ue support barbell: toe raises; heel raises; high knee marching:  hip abdct 3 x5; hip flex/ext x10 (good  balance challenge)  -squatted rest period *step ups leading R/L x 10 ue support bilaterally then unilaterally as tolerated-> 4-5 reps leading R/L unsupported (good challenge) *stair tapping bottom step R/L unsupported after a few trails x 10 L/R. Advanced to 2nd step tap, requires ue support to get started.  Able to complete x 5 unsteady but no LOB. Good challenge. *cycling on straddled noodle    Pt requires the buoyancy and hydrostatic pressure of water for support, and to offload joints by unweighting joint load by at least 50 % in navel deep water and by at least 75-80% in chest to neck deep water.  Viscosity of  the water is needed for resistance of strengthening. Water current perturbations provides challenge to standing balance requiring increased core activation.   01/12/24 Nustep lvl 3 bilat UE/LE's  x 6 mins LAQ  R: 0# x10, 1# x10, L: 2# x10, 1# x10 Seated HS curl R:  RTB x10, YTB x10, L:  RTGB 2x10 Seated Hip abduction with GTB 3x10 Marching with UE support on back of chair x10 each Sidestepping with TrA with UE support on rail x 2 laps Standing heel raises with TrA 2x10  See below for pt education     [facility] Adult PT Treatment:                                                DATE: 01/10/24 Pt seen for aquatic therapy today.  Treatment took place in water 3.5-4.75 ft in depth at the [facility] pool. Temp of water was 91.  Pt entered/exited the pool via stairs with cl supervision and  hand rail.   -Addressed goals   *walking forward, back and side stepping in 3.6  ft with ue support of barbell  - squatted rest period *Ue support on wall->barbell: toe raises; heel raises; high knee marching:  hip abdct ; hip flex/ext x10 (good balance challenge)  -squatted rest period *step ups leading R/L x 6 ue support bilaterally then unilaterally as tolerated.  -seated rest period on 3rd step *cycling on straddled noodle    Pt requires the buoyancy and hydrostatic pressure of water  for support, and to offload joints by unweighting joint load by at least 50 % in navel deep water and by at least 75-80% in chest to neck deep water.  Viscosity of the water is needed for resistance of strengthening. Water current perturbations provides challenge to standing balance requiring increased core activation.    12/31  Reviewed pt presentation, pain levels, and HEP compliance.  Nustep lvl 3 bilat UE/LE's  x 5 mins LAQ  2# x15 on R, approx 8 on L Seated Hip abduction with GTB approx 15 Sit to stands from table minimally elevated x5,3 reps without UE support Marching with UE support on back of chair x10 each   [facility] Adult PT Treatment:                                                DATE: 12/14/23 Pt seen for aquatic therapy today.  Treatment took place in water 3.5-4.75 ft in depth at the [facility] pool. Temp of water was 91.  Pt entered/exited the pool via stairs with cl supervision and  hand rail.   *walking forward, back and side stepping in 3.6  ft with ue support of barbell  - squatted rest period *Ue support on wall: toe raises; heel raises; high knee marching  hip abdct ; hip ext x10  -squatted rest period *step ups leading R/L x 6 ue support bilaterally then unilaterally as tolerated.  -seated rest period on 3rd step *cycling; hip add/abd; flutter kicking on 3rd water step  -seated rest period *forward and backward marching ue support barbell port barbell. Pt reports reduced LB discomfort with increased submersion.   Pt requires the buoyancy and hydrostatic pressure of water for support, and to  offload joints by unweighting joint load by at least 50 % in navel deep water and by at least 75-80% in chest to neck deep water.  Viscosity of the water is needed for resistance of strengthening. Water current perturbations provides challenge to standing balance requiring increased core activation.  Reviewed response to prior treatment, pain level, and current HEP.   Pt is performing sidestepping, LAQ with 1# (sometimes without wt), standing heel/toe raises, sit to stands, seated/supine clams.  LAQ 1# 2x10 Seated hip abd with RTB x 8 reps, GTB  x10, x15 reps Sit to stands with chair in front of her with hands on her LE's Supine bridge x 10 reps S/L clams x 10 bilat   PATIENT EDUCATION:  Education details: exercise form, rationale of interventions,   Person educated: Patient Education method: Programmer, Multimedia, demo required  Education comprehension: verbalized understanding,   HOME EXERCISE PROGRAM: Pt has a HEP from prior PT.     ASSESSMENT:  CLINICAL IMPRESSION: Pt continues to have bilat knee pain and L hip/groin pain though states her back is not bothering her as much.  Pt reports improved function including sit to stands from the toilet and donning shoes/socks.  Pt is slow with mobility.  She continues to have decreased exercise tolerance, though is slowly improving with tolerance to land based exercises.  Pt has difficulty with sit to stands though is improving.  Pt required cuing to lift R LE with sidestepping and not slide her foot.  She tolerated treatment well reporting no increased pain after treatment though was fatigued.  She should benefit from cont skilled PT with a combination of land based and aquatic therapy.        OBJECTIVE IMPAIRMENTS: Abnormal gait, decreased activity tolerance, decreased balance, decreased endurance, decreased mobility, difficulty walking, decreased strength, and pain.   ACTIVITY LIMITATIONS: carrying, lifting, standing, sleeping, stairs, transfers, dressing, and locomotion level  PARTICIPATION LIMITATIONS: cleaning, shopping, and community activity  PERSONAL FACTORS: Time since onset of injury/illness/exacerbation and 3+ comorbidities: Pacemaker, osteopenia/osteoporosis, knee pain, arthritis, DM type 2, peripheral neuropathy are also affecting patient's functional outcome.   REHAB POTENTIAL: Good  CLINICAL  DECISION MAKING: Stable/uncomplicated  EVALUATION COMPLEXITY: Low   GOALS:  SHORT TERM GOALS: Target date: 12/19/2023   Pt will tolerate aquatic therapy without adverse effects for improved strength, mobility, pain, function, and tolerance to activity.  Baseline: Goal status: Met 01/10/24  2.  Pt will perform 5x STS test in no > 50 sec for improved functional LE strength and performance of transfers.  Baseline:  Goal status: GOAL MET  1/15  3.  Pt will report at least a 25% improvement overall in back and bilat knee pain.  Baseline:  Goal status: Partially met 01/10/24  4.  Pt will report she is able to perform her daily transfers with no > than minimal difficulty.   Baseline:  Goal status: In progress 01/19/24  currently moderate difficulty Target date:  12/26/2023    5.  Pt will report at least a 50% improvement in dressing.  Baseline:  Goal status: Met 01/10/24    LONG TERM GOALS: Target date: 02/16/2024   Pt will be able to ambulate extended community distance including performing grocery shopping without significant pain and difficulty.   Baseline:  Goal status: ONGOING  2.  Pt will report she is able to perform her daily transfers without difficulty.  Baseline:  Goal status: ONGOING  3.  Pt will perform 5x STS test in no > 20 sec  for improved functional LE strength and performance of transfers.  Baseline:  Goal status: PROGRESSING   4.  Pt will demo improved bilat hip flexion strength by 5 lbs and hip abd by 10 lbs and knee extension to 5/5 MMT for improved performance of and tolerance with functional mobility.  Baseline:  Goal status:  33% MET  5.  Pt will be independent with pool program and land based HEP for improved pain, strength, and functional mobility.  Baseline:  Goal status: ONGOING   PLAN:  PT FREQUENCY: 2x/week  PT DURATION: 4 weeks  PLANNED INTERVENTIONS: [id number]- PT Re-evaluation, 97750- Physical Performance Testing, 97110-Therapeutic  exercises, 97530- Therapeutic activity, [id number]- Neuromuscular re-education, 97535- Self Care, [id number]- Manual therapy, [phone]- Gait training, [phone]- Aquatic Therapy, Patient/Family education, Stair training, Taping, Scar mobilization, DME instructions, Cryotherapy, and Moist heat.  PLAN FOR NEXT SESSION:  Assess tenderness to palpation and soft tissue tightness next visit.  Cont with aquatic therapy and land based therapy.   CARDIAC PACEMAKER--NO E-STIM    Leigh Minerva III PT, DPT 02/08/24 5:43 PM   "

## 2024-02-22 ENCOUNTER — Ambulatory Visit (HOSPITAL_BASED_OUTPATIENT_CLINIC_OR_DEPARTMENT_OTHER): Admitting: Physical Therapy

## 2024-02-28 ENCOUNTER — Ambulatory Visit (HOSPITAL_BASED_OUTPATIENT_CLINIC_OR_DEPARTMENT_OTHER): Admitting: Physical Therapy

## 2024-03-06 ENCOUNTER — Ambulatory Visit: Admitting: Podiatry

## 2024-03-07 ENCOUNTER — Ambulatory Visit: Admitting: Family Medicine

## 2024-03-13 ENCOUNTER — Encounter (HOSPITAL_BASED_OUTPATIENT_CLINIC_OR_DEPARTMENT_OTHER): Admitting: Physical Therapy

## 2024-03-21 ENCOUNTER — Encounter (HOSPITAL_BASED_OUTPATIENT_CLINIC_OR_DEPARTMENT_OTHER): Admitting: Physical Therapy

## 2024-03-28 ENCOUNTER — Encounter (HOSPITAL_BASED_OUTPATIENT_CLINIC_OR_DEPARTMENT_OTHER): Admitting: Physical Therapy
